# Patient Record
Sex: Male | Born: 1965 | Race: White | Hispanic: No | Smoking: Former smoker
Health system: Southern US, Community
[De-identification: ages and names within clinical notes are randomized; demographics above are authoritative.]

## PROBLEM LIST (undated history)

## (undated) DIAGNOSIS — I4891 Unspecified atrial fibrillation: Secondary | ICD-10-CM

## (undated) DIAGNOSIS — I471 Supraventricular tachycardia, unspecified: Secondary | ICD-10-CM

## (undated) DIAGNOSIS — K4090 Unilateral inguinal hernia, without obstruction or gangrene, not specified as recurrent: Secondary | ICD-10-CM

## (undated) DIAGNOSIS — G894 Chronic pain syndrome: Secondary | ICD-10-CM

## (undated) DIAGNOSIS — E785 Hyperlipidemia, unspecified: Secondary | ICD-10-CM

## (undated) DIAGNOSIS — R7303 Prediabetes: Secondary | ICD-10-CM

## (undated) DIAGNOSIS — M419 Scoliosis, unspecified: Secondary | ICD-10-CM

## (undated) DIAGNOSIS — R011 Cardiac murmur, unspecified: Secondary | ICD-10-CM

## (undated) DIAGNOSIS — I499 Cardiac arrhythmia, unspecified: Secondary | ICD-10-CM

## (undated) DIAGNOSIS — Z87442 Personal history of urinary calculi: Secondary | ICD-10-CM

## (undated) DIAGNOSIS — M1990 Unspecified osteoarthritis, unspecified site: Secondary | ICD-10-CM

## (undated) DIAGNOSIS — Z5189 Encounter for other specified aftercare: Secondary | ICD-10-CM

## (undated) DIAGNOSIS — I429 Cardiomyopathy, unspecified: Secondary | ICD-10-CM

## (undated) DIAGNOSIS — N200 Calculus of kidney: Secondary | ICD-10-CM

## (undated) DIAGNOSIS — I10 Essential (primary) hypertension: Secondary | ICD-10-CM

## (undated) HISTORY — DX: Supraventricular tachycardia: I47.1

## (undated) HISTORY — DX: Essential (primary) hypertension: I10

## (undated) HISTORY — DX: Scoliosis, unspecified: M41.9

## (undated) HISTORY — PX: REPLACEMENT TOTAL KNEE: SUR1224

## (undated) HISTORY — DX: Supraventricular tachycardia, unspecified: I47.10

## (undated) HISTORY — PX: AV NODE ABLATION: SHX1209

## (undated) HISTORY — PX: COLONOSCOPY: SHX174

## (undated) HISTORY — DX: Calculus of kidney: N20.0

## (undated) HISTORY — PX: INGUINAL HERNIA REPAIR: SUR1180

## (undated) HISTORY — DX: Unspecified atrial fibrillation: I48.91

## (undated) HISTORY — DX: Hyperlipidemia, unspecified: E78.5

## (undated) HISTORY — DX: Encounter for other specified aftercare: Z51.89

## (undated) HISTORY — PX: BACK SURGERY: SHX140

## (undated) HISTORY — DX: Cardiomyopathy, unspecified: I42.9

## (undated) HISTORY — DX: Chronic pain syndrome: G89.4

## (undated) NOTE — *Deleted (*Deleted)
Please stop by lab before you go If you have mychart- we will send your results within 3 business days of [place] receiving them.  If you do not have mychart- we will call you about results within 5 business days of [place] receiving them.  *please note we are currently using [facility] which has a longer processing time than [facility] typically so labs may not come back as quickly as in the past *please also note that you will see labs on mychart as soon as they post. I will later go in and write notes on them- will say "notes from Dr. Durene Cal"  Health Maintenance Due  Topic Date Due  . COVID-19 Vaccine (1) Never done  . TETANUS/TDAP  01/07/2020  . INFLUENZA VACCINE  03/17/2020   Depression screen [facility] 2/9 11/07/2019 05/01/2019 10/13/2017  Decreased Interest 0 0 0  Down, Depressed, Hopeless 0 0 0  PHQ - 2 Score 0 0 0  Altered sleeping 1 - -  Tired, decreased energy 0 - -  Change in appetite 0 - -  Feeling bad or failure about yourself  0 - -  Trouble concentrating 0 - -  Moving slowly or fidgety/restless 0 - -  Suicidal thoughts 0 - -  PHQ-9 Score 1 - -  Difficult doing work/chores Not difficult at all - -

---

## 1998-06-06 ENCOUNTER — Emergency Department (HOSPITAL_COMMUNITY): Admission: EM | Admit: 1998-06-06 | Discharge: 1998-06-06 | Payer: Self-pay | Admitting: Emergency Medicine

## 1998-07-25 ENCOUNTER — Ambulatory Visit (HOSPITAL_COMMUNITY): Admission: RE | Admit: 1998-07-25 | Discharge: 1998-07-25 | Payer: Self-pay | Admitting: Neurosurgery

## 1998-07-25 ENCOUNTER — Encounter: Payer: Self-pay | Admitting: Neurosurgery

## 1998-07-27 ENCOUNTER — Emergency Department (HOSPITAL_COMMUNITY): Admission: EM | Admit: 1998-07-27 | Discharge: 1998-07-27 | Payer: Self-pay | Admitting: Emergency Medicine

## 1998-07-27 ENCOUNTER — Encounter: Payer: Self-pay | Admitting: Emergency Medicine

## 1998-08-15 ENCOUNTER — Encounter: Payer: Self-pay | Admitting: Neurosurgery

## 1998-08-22 ENCOUNTER — Ambulatory Visit: Admission: RE | Admit: 1998-08-22 | Discharge: 1998-08-22 | Payer: Self-pay | Admitting: Neurosurgery

## 1998-08-22 ENCOUNTER — Encounter: Payer: Self-pay | Admitting: Neurosurgery

## 1999-02-13 ENCOUNTER — Ambulatory Visit (HOSPITAL_COMMUNITY): Admission: RE | Admit: 1999-02-13 | Discharge: 1999-02-13 | Payer: Self-pay | Admitting: Neurosurgery

## 1999-02-13 ENCOUNTER — Encounter: Payer: Self-pay | Admitting: Neurosurgery

## 1999-03-04 ENCOUNTER — Ambulatory Visit (HOSPITAL_COMMUNITY): Admission: RE | Admit: 1999-03-04 | Discharge: 1999-03-04 | Payer: Self-pay | Admitting: Neurosurgery

## 1999-03-04 ENCOUNTER — Encounter: Payer: Self-pay | Admitting: Neurosurgery

## 1999-03-18 ENCOUNTER — Ambulatory Visit (HOSPITAL_COMMUNITY): Admission: RE | Admit: 1999-03-18 | Discharge: 1999-03-18 | Payer: Self-pay | Admitting: Orthopedic Surgery

## 1999-03-18 ENCOUNTER — Encounter: Payer: Self-pay | Admitting: Orthopedic Surgery

## 1999-04-29 ENCOUNTER — Inpatient Hospital Stay (HOSPITAL_COMMUNITY): Admission: RE | Admit: 1999-04-29 | Discharge: 1999-05-03 | Payer: Self-pay | Admitting: Neurosurgery

## 1999-04-29 ENCOUNTER — Encounter: Payer: Self-pay | Admitting: Neurosurgery

## 1999-05-05 ENCOUNTER — Emergency Department (HOSPITAL_COMMUNITY): Admission: EM | Admit: 1999-05-05 | Discharge: 1999-05-06 | Payer: Self-pay | Admitting: *Deleted

## 1999-06-01 ENCOUNTER — Emergency Department (HOSPITAL_COMMUNITY): Admission: EM | Admit: 1999-06-01 | Discharge: 1999-06-02 | Payer: Self-pay

## 1999-06-02 ENCOUNTER — Encounter: Payer: Self-pay | Admitting: Neurosurgery

## 1999-06-02 ENCOUNTER — Ambulatory Visit (HOSPITAL_COMMUNITY): Admission: RE | Admit: 1999-06-02 | Discharge: 1999-06-02 | Payer: Self-pay | Admitting: Neurosurgery

## 1999-07-16 ENCOUNTER — Ambulatory Visit (HOSPITAL_COMMUNITY): Admission: RE | Admit: 1999-07-16 | Discharge: 1999-07-16 | Payer: Self-pay | Admitting: Neurosurgery

## 1999-07-16 ENCOUNTER — Encounter: Payer: Self-pay | Admitting: Neurosurgery

## 1999-12-24 ENCOUNTER — Encounter: Payer: Self-pay | Admitting: Neurosurgery

## 1999-12-24 ENCOUNTER — Ambulatory Visit (HOSPITAL_COMMUNITY): Admission: RE | Admit: 1999-12-24 | Discharge: 1999-12-24 | Payer: Self-pay | Admitting: Neurosurgery

## 2000-02-10 ENCOUNTER — Encounter: Admission: RE | Admit: 2000-02-10 | Discharge: 2000-05-10 | Payer: Self-pay | Admitting: Anesthesiology

## 2000-04-13 ENCOUNTER — Inpatient Hospital Stay (HOSPITAL_COMMUNITY): Admission: RE | Admit: 2000-04-13 | Discharge: 2000-04-16 | Payer: Self-pay | Admitting: Neurosurgery

## 2000-04-13 ENCOUNTER — Encounter: Payer: Self-pay | Admitting: Neurosurgery

## 2000-05-14 ENCOUNTER — Ambulatory Visit (HOSPITAL_COMMUNITY): Admission: RE | Admit: 2000-05-14 | Discharge: 2000-05-14 | Payer: Self-pay | Admitting: Neurosurgery

## 2000-05-14 ENCOUNTER — Encounter: Payer: Self-pay | Admitting: Neurosurgery

## 2000-05-21 ENCOUNTER — Encounter: Admission: RE | Admit: 2000-05-21 | Discharge: 2000-08-19 | Payer: Self-pay | Admitting: Anesthesiology

## 2000-07-20 ENCOUNTER — Encounter: Payer: Self-pay | Admitting: Neurosurgery

## 2000-07-20 ENCOUNTER — Ambulatory Visit (HOSPITAL_COMMUNITY): Admission: RE | Admit: 2000-07-20 | Discharge: 2000-07-20 | Payer: Self-pay | Admitting: Neurosurgery

## 2000-08-06 ENCOUNTER — Encounter: Admission: RE | Admit: 2000-08-06 | Discharge: 2000-08-06 | Payer: Self-pay | Admitting: Internal Medicine

## 2000-08-06 ENCOUNTER — Encounter: Payer: Self-pay | Admitting: Internal Medicine

## 2000-09-03 ENCOUNTER — Encounter: Admission: RE | Admit: 2000-09-03 | Discharge: 2000-12-02 | Payer: Self-pay | Admitting: Anesthesiology

## 2000-12-01 ENCOUNTER — Encounter: Admission: RE | Admit: 2000-12-01 | Discharge: 2001-03-01 | Payer: Self-pay | Admitting: Anesthesiology

## 2001-01-24 ENCOUNTER — Encounter: Payer: Self-pay | Admitting: Emergency Medicine

## 2001-01-24 ENCOUNTER — Emergency Department (HOSPITAL_COMMUNITY): Admission: EM | Admit: 2001-01-24 | Discharge: 2001-01-24 | Payer: Self-pay | Admitting: Internal Medicine

## 2001-02-21 ENCOUNTER — Encounter: Payer: Self-pay | Admitting: Neurosurgery

## 2001-02-21 ENCOUNTER — Ambulatory Visit (HOSPITAL_COMMUNITY): Admission: RE | Admit: 2001-02-21 | Discharge: 2001-02-21 | Payer: Self-pay | Admitting: Neurosurgery

## 2001-03-04 ENCOUNTER — Encounter: Payer: Self-pay | Admitting: Neurosurgery

## 2001-03-04 ENCOUNTER — Encounter: Admission: RE | Admit: 2001-03-04 | Discharge: 2001-03-04 | Payer: Self-pay | Admitting: Neurosurgery

## 2001-03-07 ENCOUNTER — Encounter: Admission: RE | Admit: 2001-03-07 | Discharge: 2001-03-16 | Payer: Self-pay | Admitting: Anesthesiology

## 2001-03-31 ENCOUNTER — Encounter: Admission: RE | Admit: 2001-03-31 | Discharge: 2001-04-16 | Payer: Self-pay | Admitting: Anesthesiology

## 2005-05-07 ENCOUNTER — Ambulatory Visit: Payer: Self-pay | Admitting: Internal Medicine

## 2005-05-18 ENCOUNTER — Ambulatory Visit: Payer: Self-pay

## 2005-05-20 ENCOUNTER — Ambulatory Visit: Payer: Self-pay | Admitting: Internal Medicine

## 2005-05-25 ENCOUNTER — Ambulatory Visit: Payer: Self-pay | Admitting: Internal Medicine

## 2005-05-28 ENCOUNTER — Ambulatory Visit: Payer: Self-pay | Admitting: Cardiology

## 2005-07-03 ENCOUNTER — Ambulatory Visit: Payer: Self-pay | Admitting: Internal Medicine

## 2005-07-14 ENCOUNTER — Ambulatory Visit: Payer: Self-pay | Admitting: Internal Medicine

## 2005-09-09 ENCOUNTER — Ambulatory Visit: Payer: Self-pay | Admitting: Internal Medicine

## 2005-09-15 ENCOUNTER — Ambulatory Visit: Payer: Self-pay | Admitting: Internal Medicine

## 2005-11-18 ENCOUNTER — Ambulatory Visit: Payer: Self-pay | Admitting: Internal Medicine

## 2005-11-18 ENCOUNTER — Inpatient Hospital Stay (HOSPITAL_COMMUNITY): Admission: EM | Admit: 2005-11-18 | Discharge: 2005-11-20 | Payer: Self-pay | Admitting: Emergency Medicine

## 2005-12-14 ENCOUNTER — Ambulatory Visit: Payer: Self-pay | Admitting: Internal Medicine

## 2006-01-30 ENCOUNTER — Ambulatory Visit: Payer: Self-pay | Admitting: Family Medicine

## 2006-03-30 ENCOUNTER — Ambulatory Visit: Payer: Self-pay | Admitting: Internal Medicine

## 2006-04-27 ENCOUNTER — Ambulatory Visit: Payer: Self-pay | Admitting: Internal Medicine

## 2006-06-11 ENCOUNTER — Ambulatory Visit: Payer: Self-pay | Admitting: Internal Medicine

## 2006-06-15 ENCOUNTER — Ambulatory Visit: Payer: Self-pay | Admitting: Internal Medicine

## 2006-08-12 ENCOUNTER — Ambulatory Visit: Payer: Self-pay | Admitting: Internal Medicine

## 2006-09-20 ENCOUNTER — Ambulatory Visit: Payer: Self-pay | Admitting: Internal Medicine

## 2006-11-19 ENCOUNTER — Ambulatory Visit: Payer: Self-pay | Admitting: Internal Medicine

## 2006-11-19 LAB — CONVERTED CEMR LAB
Cholesterol: 170 mg/dL (ref 0–200)
HDL: 31.1 mg/dL — ABNORMAL LOW (ref >39.0)
LDL Cholesterol: 100 mg/dL — ABNORMAL HIGH (ref 0–99)
Total CHOL/HDL Ratio: 5.5
Triglycerides: 196 mg/dL — ABNORMAL HIGH (ref 0–149)
VLDL: 39 mg/dL (ref 0–40)

## 2006-12-03 ENCOUNTER — Ambulatory Visit: Payer: Self-pay | Admitting: Internal Medicine

## 2007-03-07 DIAGNOSIS — I10 Essential (primary) hypertension: Secondary | ICD-10-CM | POA: Insufficient documentation

## 2007-03-07 DIAGNOSIS — M549 Dorsalgia, unspecified: Secondary | ICD-10-CM | POA: Insufficient documentation

## 2007-03-07 DIAGNOSIS — Z87442 Personal history of urinary calculi: Secondary | ICD-10-CM | POA: Insufficient documentation

## 2007-04-07 ENCOUNTER — Ambulatory Visit: Payer: Self-pay | Admitting: Internal Medicine

## 2007-04-07 LAB — CONVERTED CEMR LAB
ALT: 26 units/L (ref 0–53)
AST: 22 units/L (ref 0–37)
Albumin: 4.2 g/dL (ref 3.5–5.2)
Alkaline Phosphatase: 56 units/L (ref 39–117)
BUN: 10 mg/dL (ref 6–23)
Basophils Absolute: 0 10*3/uL (ref 0.0–0.1)
Basophils Relative: 0.1 % (ref 0.0–1.0)
Bilirubin, Direct: 0.1 mg/dL (ref 0.0–0.3)
CO2: 34 meq/L — ABNORMAL HIGH (ref 19–32)
Calcium: 9.6 mg/dL (ref 8.4–10.5)
Chloride: 99 meq/L (ref 96–112)
Cholesterol: 198 mg/dL (ref 0–200)
Creatinine, Ser: 0.8 mg/dL (ref 0.4–1.5)
Eosinophils Absolute: 0.2 10*3/uL (ref 0.0–0.6)
Eosinophils Relative: 2.5 % (ref 0.0–5.0)
GFR calc Af Amer: 136 mL/min
GFR calc non Af Amer: 112 mL/min
Glucose, Bld: 102 mg/dL — ABNORMAL HIGH (ref 70–99)
HCT: 47 % (ref 39.0–52.0)
HDL: 29.9 mg/dL — ABNORMAL LOW (ref >39.0)
Hemoglobin: 16.5 g/dL (ref 13.0–17.0)
LDL Cholesterol: 137 mg/dL — ABNORMAL HIGH (ref 0–99)
Lymphocytes Relative: 38.7 % (ref 12.0–46.0)
MCHC: 35.1 g/dL (ref 30.0–36.0)
MCV: 87.4 fL (ref 78.0–100.0)
Monocytes Absolute: 0.7 10*3/uL (ref 0.2–0.7)
Monocytes Relative: 10.1 % (ref 3.0–11.0)
Neutro Abs: 3.3 10*3/uL (ref 1.4–7.7)
Neutrophils Relative %: 48.6 % (ref 43.0–77.0)
Platelets: 303 10*3/uL (ref 150–400)
Potassium: 4.2 meq/L (ref 3.5–5.1)
RBC: 5.37 M/uL (ref 4.22–5.81)
RDW: 12.3 % (ref 11.5–14.6)
Sodium: 139 meq/L (ref 135–145)
Total Bilirubin: 1.3 mg/dL — ABNORMAL HIGH (ref 0.3–1.2)
Total CHOL/HDL Ratio: 6.6
Total Protein: 7.6 g/dL (ref 6.0–8.3)
Triglycerides: 156 mg/dL — ABNORMAL HIGH (ref 0–149)
VLDL: 31 mg/dL (ref 0–40)
WBC: 6.8 10*3/uL (ref 4.5–10.5)

## 2007-04-14 ENCOUNTER — Ambulatory Visit: Payer: Self-pay | Admitting: Internal Medicine

## 2007-04-14 DIAGNOSIS — E1169 Type 2 diabetes mellitus with other specified complication: Secondary | ICD-10-CM | POA: Insufficient documentation

## 2007-04-14 DIAGNOSIS — E785 Hyperlipidemia, unspecified: Secondary | ICD-10-CM | POA: Insufficient documentation

## 2007-04-15 ENCOUNTER — Telehealth: Payer: Self-pay | Admitting: Internal Medicine

## 2007-04-19 ENCOUNTER — Ambulatory Visit: Payer: Self-pay | Admitting: Internal Medicine

## 2007-04-26 ENCOUNTER — Telehealth: Payer: Self-pay | Admitting: Internal Medicine

## 2007-07-26 ENCOUNTER — Ambulatory Visit: Payer: Self-pay | Admitting: Internal Medicine

## 2007-07-26 LAB — CONVERTED CEMR LAB
ALT: 27 units/L (ref 0–53)
AST: 27 units/L (ref 0–37)
Albumin: 4.3 g/dL (ref 3.5–5.2)
Alkaline Phosphatase: 59 units/L (ref 39–117)
BUN: 11 mg/dL (ref 6–23)
Basophils Absolute: 0 10*3/uL (ref 0.0–0.1)
Basophils Relative: 0.2 % (ref 0.0–1.0)
Bilirubin Urine: NEGATIVE
Bilirubin, Direct: 0.3 mg/dL (ref 0.0–0.3)
CO2: 33 meq/L — ABNORMAL HIGH (ref 19–32)
Calcium: 10.1 mg/dL (ref 8.4–10.5)
Chloride: 99 meq/L (ref 96–112)
Cholesterol: 192 mg/dL (ref 0–200)
Creatinine, Ser: 1.1 mg/dL (ref 0.4–1.5)
Eosinophils Absolute: 0.1 10*3/uL (ref 0.0–0.6)
Eosinophils Relative: 1.4 % (ref 0.0–5.0)
GFR calc Af Amer: 94 mL/min
GFR calc non Af Amer: 78 mL/min
Glucose, Bld: 108 mg/dL — ABNORMAL HIGH (ref 70–99)
Glucose, Urine, Semiquant: NEGATIVE
HCT: 47 % (ref 39.0–52.0)
HDL: 32.3 mg/dL — ABNORMAL LOW (ref >39.0)
Hemoglobin: 16 g/dL (ref 13.0–17.0)
Ketones, urine, test strip: NEGATIVE
LDL Cholesterol: 131 mg/dL — ABNORMAL HIGH (ref 0–99)
Lymphocytes Relative: 32.9 % (ref 12.0–46.0)
MCHC: 34 g/dL (ref 30.0–36.0)
MCV: 87.6 fL (ref 78.0–100.0)
Monocytes Absolute: 0.5 10*3/uL (ref 0.2–0.7)
Monocytes Relative: 7.6 % (ref 3.0–11.0)
Neutro Abs: 3.5 10*3/uL (ref 1.4–7.7)
Neutrophils Relative %: 57.9 % (ref 43.0–77.0)
Nitrite: NEGATIVE
Platelets: 251 10*3/uL (ref 150–400)
Potassium: 5.3 meq/L — ABNORMAL HIGH (ref 3.5–5.1)
RBC: 5.36 M/uL (ref 4.22–5.81)
RDW: 12.4 % (ref 11.5–14.6)
Sodium: 140 meq/L (ref 135–145)
Specific Gravity, Urine: >=1.03
Total Bilirubin: 1.2 mg/dL (ref 0.3–1.2)
Total CHOL/HDL Ratio: 5.9
Total Protein: 7.5 g/dL (ref 6.0–8.3)
Triglycerides: 145 mg/dL (ref 0–149)
Urobilinogen, UA: 0.2
VLDL: 29 mg/dL (ref 0–40)
WBC Urine, dipstick: NEGATIVE
WBC: 6.1 10*3/uL (ref 4.5–10.5)
pH: 5

## 2007-07-31 ENCOUNTER — Emergency Department (HOSPITAL_COMMUNITY): Admission: EM | Admit: 2007-07-31 | Discharge: 2007-07-31 | Payer: Self-pay | Admitting: Emergency Medicine

## 2007-08-01 ENCOUNTER — Ambulatory Visit: Payer: Self-pay | Admitting: Internal Medicine

## 2007-08-01 LAB — CONVERTED CEMR LAB
BUN: 8 mg/dL (ref 6–23)
Bacteria, UA: NEGATIVE
Bilirubin Urine: NEGATIVE
CO2: 32 meq/L (ref 19–32)
Calcium: 9.6 mg/dL (ref 8.4–10.5)
Chloride: 99 meq/L (ref 96–112)
Creatinine, Ser: 1 mg/dL (ref 0.4–1.5)
GFR calc Af Amer: 105 mL/min
GFR calc non Af Amer: 87 mL/min
Glucose, Bld: 98 mg/dL (ref 70–99)
Ketones, ur: NEGATIVE mg/dL
Leukocytes, UA: NEGATIVE
Nitrite: NEGATIVE
Potassium: 4.2 meq/L (ref 3.5–5.1)
Sodium: 139 meq/L (ref 135–145)
Specific Gravity, Urine: 1.025 (ref 1.000–1.03)
Squamous Epithelial / LPF: NEGATIVE /lpf
TSH: 1.57 microintl units/mL (ref 0.35–5.50)
Urine Glucose: NEGATIVE mg/dL
Urobilinogen, UA: 0.2 (ref 0.0–1.0)
WBC, UA: NONE SEEN cells/hpf
pH: 6 (ref 5.0–8.0)

## 2007-08-02 ENCOUNTER — Ambulatory Visit: Payer: Self-pay | Admitting: Internal Medicine

## 2007-08-03 ENCOUNTER — Encounter: Payer: Self-pay | Admitting: Internal Medicine

## 2007-08-03 ENCOUNTER — Ambulatory Visit: Payer: Self-pay | Admitting: Internal Medicine

## 2007-08-03 ENCOUNTER — Ambulatory Visit: Payer: Self-pay

## 2007-08-12 ENCOUNTER — Ambulatory Visit: Payer: Self-pay | Admitting: Internal Medicine

## 2007-08-18 DIAGNOSIS — I4891 Unspecified atrial fibrillation: Secondary | ICD-10-CM

## 2007-08-18 DIAGNOSIS — I429 Cardiomyopathy, unspecified: Secondary | ICD-10-CM

## 2007-08-18 HISTORY — DX: Cardiomyopathy, unspecified: I42.9

## 2007-08-18 HISTORY — DX: Unspecified atrial fibrillation: I48.91

## 2007-08-22 ENCOUNTER — Telehealth: Payer: Self-pay | Admitting: Internal Medicine

## 2007-09-07 ENCOUNTER — Ambulatory Visit: Payer: Self-pay | Admitting: Internal Medicine

## 2007-09-07 LAB — CONVERTED CEMR LAB
BUN: 8 mg/dL (ref 6–23)
Basophils Absolute: 0 10*3/uL (ref 0.0–0.1)
Basophils Relative: 0.7 % (ref 0.0–1.0)
CO2: 33 meq/L — ABNORMAL HIGH (ref 19–32)
Calcium: 9.8 mg/dL (ref 8.4–10.5)
Chloride: 99 meq/L (ref 96–112)
Creatinine, Ser: 1.1 mg/dL (ref 0.4–1.5)
Eosinophils Absolute: 0.1 10*3/uL (ref 0.0–0.6)
Eosinophils Relative: 1.2 % (ref 0.0–5.0)
GFR calc Af Amer: 94 mL/min
GFR calc non Af Amer: 77 mL/min
Glucose, Bld: 98 mg/dL (ref 70–99)
HCT: 46.7 % (ref 39.0–52.0)
Hemoglobin: 16.7 g/dL (ref 13.0–17.0)
INR: 1.1 — ABNORMAL HIGH (ref 0.8–1.0)
Lymphocytes Relative: 33.3 % (ref 12.0–46.0)
MCHC: 35.7 g/dL (ref 30.0–36.0)
MCV: 86.5 fL (ref 78.0–100.0)
Monocytes Absolute: 0.5 10*3/uL (ref 0.2–0.7)
Monocytes Relative: 7.5 % (ref 3.0–11.0)
Neutro Abs: 3.6 10*3/uL (ref 1.4–7.7)
Neutrophils Relative %: 57.3 % (ref 43.0–77.0)
Platelets: 278 10*3/uL (ref 150–400)
Potassium: 3.9 meq/L (ref 3.5–5.1)
Prothrombin Time: 12.6 s (ref 10.9–13.3)
RBC: 5.4 M/uL (ref 4.22–5.81)
RDW: 13.2 % (ref 11.5–14.6)
Sodium: 139 meq/L (ref 135–145)
WBC: 6.3 10*3/uL (ref 4.5–10.5)

## 2007-09-08 ENCOUNTER — Ambulatory Visit: Payer: Self-pay | Admitting: Internal Medicine

## 2007-09-08 LAB — CONVERTED CEMR LAB
ALT: 32 units/L (ref 0–53)
AST: 28 units/L (ref 0–37)
Albumin: 4.5 g/dL (ref 3.5–5.2)
Alkaline Phosphatase: 57 units/L (ref 39–117)
BUN: 9 mg/dL (ref 6–23)
Bilirubin, Direct: 0.1 mg/dL (ref 0.0–0.3)
CO2: 32 meq/L (ref 19–32)
Calcium: 10 mg/dL (ref 8.4–10.5)
Chloride: 98 meq/L (ref 96–112)
Creatinine, Ser: 1 mg/dL (ref 0.4–1.5)
GFR calc Af Amer: 104 mL/min
GFR calc non Af Amer: 86 mL/min
Glucose, Bld: 120 mg/dL — ABNORMAL HIGH (ref 70–99)
Potassium: 3.9 meq/L (ref 3.5–5.1)
Sodium: 139 meq/L (ref 135–145)
Total Bilirubin: 0.8 mg/dL (ref 0.3–1.2)
Total Protein: 7.9 g/dL (ref 6.0–8.3)

## 2007-09-16 ENCOUNTER — Ambulatory Visit: Payer: Self-pay | Admitting: Internal Medicine

## 2007-09-16 ENCOUNTER — Ambulatory Visit (HOSPITAL_COMMUNITY): Admission: RE | Admit: 2007-09-16 | Discharge: 2007-09-17 | Payer: Self-pay | Admitting: Internal Medicine

## 2007-10-19 ENCOUNTER — Ambulatory Visit: Payer: Self-pay | Admitting: Internal Medicine

## 2008-01-30 ENCOUNTER — Encounter: Payer: Self-pay | Admitting: Internal Medicine

## 2008-02-03 ENCOUNTER — Encounter: Payer: Self-pay | Admitting: Internal Medicine

## 2008-07-27 DIAGNOSIS — I429 Cardiomyopathy, unspecified: Secondary | ICD-10-CM | POA: Insufficient documentation

## 2008-08-21 ENCOUNTER — Telehealth: Payer: Self-pay | Admitting: Internal Medicine

## 2008-10-30 ENCOUNTER — Telehealth: Payer: Self-pay | Admitting: Internal Medicine

## 2008-10-31 ENCOUNTER — Ambulatory Visit: Payer: Self-pay | Admitting: Internal Medicine

## 2009-04-02 ENCOUNTER — Ambulatory Visit: Payer: Self-pay | Admitting: Internal Medicine

## 2009-05-20 ENCOUNTER — Ambulatory Visit: Payer: Self-pay | Admitting: Internal Medicine

## 2009-10-23 ENCOUNTER — Encounter: Payer: Self-pay | Admitting: Internal Medicine

## 2009-11-07 ENCOUNTER — Ambulatory Visit: Payer: Self-pay | Admitting: Family Medicine

## 2009-11-07 ENCOUNTER — Telehealth: Payer: Self-pay | Admitting: Family Medicine

## 2009-11-11 ENCOUNTER — Telehealth (INDEPENDENT_AMBULATORY_CARE_PROVIDER_SITE_OTHER): Payer: Self-pay | Admitting: *Deleted

## 2009-11-11 ENCOUNTER — Ambulatory Visit: Payer: Self-pay | Admitting: Internal Medicine

## 2009-11-11 ENCOUNTER — Telehealth: Payer: Self-pay | Admitting: Internal Medicine

## 2009-11-27 ENCOUNTER — Telehealth: Payer: Self-pay | Admitting: Internal Medicine

## 2009-12-02 ENCOUNTER — Encounter: Payer: Self-pay | Admitting: Internal Medicine

## 2009-12-26 ENCOUNTER — Ambulatory Visit: Payer: Self-pay | Admitting: Internal Medicine

## 2009-12-26 LAB — CONVERTED CEMR LAB
ALT: 28 units/L (ref 0–53)
AST: 40 units/L — ABNORMAL HIGH (ref 0–37)
Albumin: 4.5 g/dL (ref 3.5–5.2)
Alkaline Phosphatase: 58 units/L (ref 39–117)
BUN: 12 mg/dL (ref 6–23)
Basophils Absolute: 0 10*3/uL (ref 0.0–0.1)
Basophils Relative: 0.5 % (ref 0.0–3.0)
Bilirubin, Direct: 0.3 mg/dL (ref 0.0–0.3)
CO2: 33 meq/L — ABNORMAL HIGH (ref 19–32)
Calcium: 10 mg/dL (ref 8.4–10.5)
Chloride: 102 meq/L (ref 96–112)
Cholesterol: 177 mg/dL (ref 0–200)
Creatinine, Ser: 0.9 mg/dL (ref 0.4–1.5)
Eosinophils Absolute: 0.1 10*3/uL (ref 0.0–0.7)
Eosinophils Relative: 1.2 % (ref 0.0–5.0)
GFR calc non Af Amer: 91.7 mL/min (ref >60)
Glucose, Bld: 89 mg/dL (ref 70–99)
Glucose, Urine, Semiquant: NEGATIVE
HCT: 46.2 % (ref 39.0–52.0)
HDL: 57.2 mg/dL (ref >39.00)
Hemoglobin: 16 g/dL (ref 13.0–17.0)
LDL Cholesterol: 100 mg/dL — ABNORMAL HIGH (ref 0–99)
Lymphocytes Relative: 34.2 % (ref 12.0–46.0)
Lymphs Abs: 2 10*3/uL (ref 0.7–4.0)
MCHC: 34.7 g/dL (ref 30.0–36.0)
MCV: 90.5 fL (ref 78.0–100.0)
Monocytes Absolute: 0.6 10*3/uL (ref 0.1–1.0)
Monocytes Relative: 10.4 % (ref 3.0–12.0)
Neutro Abs: 3.2 10*3/uL (ref 1.4–7.7)
Neutrophils Relative %: 53.7 % (ref 43.0–77.0)
Nitrite: NEGATIVE
Platelets: 241 10*3/uL (ref 150.0–400.0)
Potassium: 4.9 meq/L (ref 3.5–5.1)
RBC: 5.11 M/uL (ref 4.22–5.81)
RDW: 13.6 % (ref 11.5–14.6)
Sodium: 147 meq/L — ABNORMAL HIGH (ref 135–145)
Specific Gravity, Urine: >=1.03
TSH: 3.84 microintl units/mL (ref 0.35–5.50)
Total Bilirubin: 2.3 mg/dL — ABNORMAL HIGH (ref 0.3–1.2)
Total CHOL/HDL Ratio: 3
Total Protein: 7.4 g/dL (ref 6.0–8.3)
Triglycerides: 99 mg/dL (ref 0.0–149.0)
Urobilinogen, UA: 0.2
VLDL: 19.8 mg/dL (ref 0.0–40.0)
WBC Urine, dipstick: NEGATIVE
WBC: 5.9 10*3/uL (ref 4.5–10.5)
pH: 5.5

## 2009-12-27 ENCOUNTER — Ambulatory Visit: Payer: Self-pay | Admitting: Internal Medicine

## 2009-12-27 ENCOUNTER — Encounter: Payer: Self-pay | Admitting: Internal Medicine

## 2010-01-01 ENCOUNTER — Telehealth: Payer: Self-pay | Admitting: Internal Medicine

## 2010-01-06 ENCOUNTER — Telehealth: Payer: Self-pay | Admitting: Internal Medicine

## 2010-01-06 ENCOUNTER — Ambulatory Visit: Payer: Self-pay | Admitting: Internal Medicine

## 2010-01-06 DIAGNOSIS — M4120 Other idiopathic scoliosis, site unspecified: Secondary | ICD-10-CM | POA: Insufficient documentation

## 2010-01-20 ENCOUNTER — Ambulatory Visit: Payer: Self-pay | Admitting: Internal Medicine

## 2010-01-22 ENCOUNTER — Telehealth: Payer: Self-pay | Admitting: Internal Medicine

## 2010-01-29 ENCOUNTER — Telehealth: Payer: Self-pay | Admitting: Internal Medicine

## 2010-01-31 ENCOUNTER — Encounter
Admission: RE | Admit: 2010-01-31 | Discharge: 2010-02-06 | Payer: Self-pay | Admitting: Physical Medicine & Rehabilitation

## 2010-02-06 ENCOUNTER — Telehealth: Payer: Self-pay | Admitting: Internal Medicine

## 2010-02-06 ENCOUNTER — Ambulatory Visit: Payer: Self-pay | Admitting: Physical Medicine & Rehabilitation

## 2010-02-07 ENCOUNTER — Telehealth: Payer: Self-pay | Admitting: Family Medicine

## 2010-02-07 ENCOUNTER — Telehealth: Payer: Self-pay | Admitting: Internal Medicine

## 2010-02-11 ENCOUNTER — Telehealth: Payer: Self-pay | Admitting: Internal Medicine

## 2010-02-13 ENCOUNTER — Telehealth: Payer: Self-pay | Admitting: Internal Medicine

## 2010-02-19 ENCOUNTER — Telehealth: Payer: Self-pay | Admitting: Internal Medicine

## 2010-02-25 ENCOUNTER — Telehealth: Payer: Self-pay | Admitting: Internal Medicine

## 2010-02-26 ENCOUNTER — Ambulatory Visit: Payer: Self-pay | Admitting: Internal Medicine

## 2010-02-28 LAB — CONVERTED CEMR LAB
ALT: 22 units/L (ref 0–53)
AST: 24 units/L (ref 0–37)
Albumin: 4.4 g/dL (ref 3.5–5.2)
Alkaline Phosphatase: 58 units/L (ref 39–117)
BUN: 17 mg/dL (ref 6–23)
Basophils Absolute: 0 10*3/uL (ref 0.0–0.1)
Basophils Relative: 0.6 % (ref 0.0–3.0)
Bilirubin, Direct: 0.1 mg/dL (ref 0.0–0.3)
CO2: 35 meq/L — ABNORMAL HIGH (ref 19–32)
Calcium: 10.1 mg/dL (ref 8.4–10.5)
Chloride: 97 meq/L (ref 96–112)
Creatinine, Ser: 1 mg/dL (ref 0.4–1.5)
Eosinophils Absolute: 0.1 10*3/uL (ref 0.0–0.7)
Eosinophils Relative: 1.3 % (ref 0.0–5.0)
GFR calc non Af Amer: 87.32 mL/min (ref >60)
Glucose, Bld: 114 mg/dL — ABNORMAL HIGH (ref 70–99)
HCT: 51.7 % (ref 39.0–52.0)
Hemoglobin: 17.7 g/dL — ABNORMAL HIGH (ref 13.0–17.0)
Lymphocytes Relative: 28.3 % (ref 12.0–46.0)
Lymphs Abs: 2.4 10*3/uL (ref 0.7–4.0)
MCHC: 34.2 g/dL (ref 30.0–36.0)
MCV: 91.4 fL (ref 78.0–100.0)
Monocytes Absolute: 0.6 10*3/uL (ref 0.1–1.0)
Monocytes Relative: 7.5 % (ref 3.0–12.0)
Neutro Abs: 5.3 10*3/uL (ref 1.4–7.7)
Neutrophils Relative %: 62.3 % (ref 43.0–77.0)
Platelets: 258 10*3/uL (ref 150.0–400.0)
Potassium: 4.7 meq/L (ref 3.5–5.1)
RBC: 5.66 M/uL (ref 4.22–5.81)
RDW: 13.2 % (ref 11.5–14.6)
Sed Rate: 8 mm/hr (ref 0–22)
Sodium: 139 meq/L (ref 135–145)
TSH: 2.7 microintl units/mL (ref 0.35–5.50)
Total Bilirubin: 0.9 mg/dL (ref 0.3–1.2)
Total Protein: 7.7 g/dL (ref 6.0–8.3)
WBC: 8.4 10*3/uL (ref 4.5–10.5)

## 2010-03-07 ENCOUNTER — Telehealth: Payer: Self-pay | Admitting: *Deleted

## 2010-03-17 ENCOUNTER — Telehealth: Payer: Self-pay | Admitting: Internal Medicine

## 2010-03-26 ENCOUNTER — Telehealth: Payer: Self-pay | Admitting: Internal Medicine

## 2010-04-09 ENCOUNTER — Encounter: Payer: Self-pay | Admitting: Internal Medicine

## 2010-06-04 ENCOUNTER — Telehealth: Payer: Self-pay | Admitting: Internal Medicine

## 2010-06-16 ENCOUNTER — Ambulatory Visit: Payer: Self-pay | Admitting: Internal Medicine

## 2010-06-16 DIAGNOSIS — Z8679 Personal history of other diseases of the circulatory system: Secondary | ICD-10-CM | POA: Insufficient documentation

## 2010-06-16 DIAGNOSIS — I471 Supraventricular tachycardia: Secondary | ICD-10-CM | POA: Insufficient documentation

## 2010-09-13 ENCOUNTER — Encounter: Payer: Self-pay | Admitting: Internal Medicine
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
## 2010-09-16 NOTE — Progress Notes (Signed)
  Phone Note Call from Patient Call back at Home Phone [phone] Call back at [phone]   Caller: Patient Call For: Evany Schecter Summary of Call: PT WAS SEEN ON 8/28 HAS QUESTIONS ABOUT RX MED PLS CALL Initial call taken by: Shan Levans,  April 15, 2007 8:42 AM         Appended Document:  Pt called with questions about his meds..........after reading his instruction sheet.  He understood when he left the office to  1. dc gemfibrozil 2. continue Cymbalta 3. take 1/2 of Lisinopril 10 mg. .........daily  His instruction sheet was different?  Advised that I would talk to Dr. Cato Mulligan, and if there are any change, I would call him back.  If not .......continue as above.

## 2010-09-16 NOTE — Assessment & Plan Note (Signed)
Summary: ROA AFTER DR Marikay Alar APPT/JLS  Medications Added ADVIL 200 MG TABS (IBUPROFEN) Take 1 three times a day,prn ALPRAZOLAM 1 MG TABS (ALPRAZOLAM) Take 1 tab by mouth at bedtime LISINOPRIL 10 MG TABS (LISINOPRIL) 1 daily TYLENOL GO TABS EXTRA STRENGTH 500 MG  CHEW (ACETAMINOPHEN)  TOPROL XL 25 MG  TB24 (METOPROLOL SUCCINATE) Take 1 tablet by mouth every morning        Vital Signs:  Patient Profile:   45 Years Old Male Weight:      182 pounds (82.73 kg) Temp:     98.2 degrees F (36.78 degrees C) oral Pulse rate:   76 / minute BP sitting:   148 / 96  (right arm)  Vitals Entered By: Arcola Jansky, RN (September 08, 2007 11:33 AM)                 Chief Complaint:  1) HEART SURG. NEXT FRI (SVT ABLATION)  2)  DIARREHA X 1 WK , RUNNY NOSE, and NAUSEA TODAY.  History of Present Illness: Current Problems:  SYNCOPE (ICD-780.2) -evaluation with Dr. Graciela Husbands HYPERLIPIDEMIA (ICD-272.4)-medications without difficulty. BACK PAIN (ICD-724.5)-long-term problem followed by pain management. HYPERTENSION (ICD-401.9)-no headache or neurologic deficits. SVT---no sxs but scheduled for ablation procedure   Past Medical History: chronic pain syndrome--pain clinic blood transfusion 1960's Hypertension Nephrolithiasis, hx of Hyperlipidemia  Social History: disabeled Married Never Smoked   Family History: mother RA father osteoporosis     Current Allergies: No known allergies      Review of Systems       no other complaints in a complete ROS    Physical Exam  General:     Well-developed,well-nourished,in no acute distress; alert,appropriate and cooperative throughout examination Head:     normocephalic and atraumatic.   Eyes:     pupils equal and pupils round.   Neck:     No deformities, masses, or tenderness noted. Lungs:     Normal respiratory effort, chest expands symmetrically. Lungs are clear to auscultation, no crackles or wheezes. Heart:     Normal rate  and regular rhythm. S1 and S2 normal without gallop, murmur, click, rub or other extra sounds. Abdomen:     Bowel sounds positive,abdomen soft and non-tender without masses, organomegaly or hernias noted. Msk:     marked scoliosis of the back. Pulses:     R radial normal and L radial normal.   Extremities:     No clubbing, cyanosis, edema, or deformity noted with normal full range of motion of all joints.   Skin:     Intact without suspicious lesions or rashes Cervical Nodes:     no anterior cervical adenopathy and no posterior cervical adenopathy.      Impression & Recommendations:  Problem # 1:  DIARRHEA (ICD-787.91) new complaint. o recent exposures.  I've acterial infection.  No recent exposures.  I've advised him not to take anything for this. Orders: Venipuncture (43154) TLB-BMP (Basic Metabolic Panel-BMET) (80048-METABOL) TLB-Hepatic/Liver Function Pnl (80076-HEPATIC)   Problem # 2:  SYNCOPE (ICD-780.2) he tells me he's been diagnosed with SVT and is scheduled for SVT ablation next week.  Problem # 3:  HYPERLIPIDEMIA (ICD-272.4)  His updated medication list for this problem includes:    Gemfibrozil 600 Mg Tabs (Gemfibrozil) ..... One by mouth daily  Labs Reviewed: Chol: 192 (07/26/2007)   HDL: 32.3 (07/26/2007)   LDL: 131 (07/26/2007)   TG: 145 (07/26/2007) SGOT: 27 (07/26/2007)   SGPT: 27 (07/26/2007)   Problem # 4:  BACK PAIN (ICD-724.5) follow up with pain clinic. His updated medication list for this problem includes:    Advil 200 Mg Tabs (Ibuprofen) .Marland Kitchen... Take 1 three times a day,prn    Morphine Sulfate 30 Mg Tabs (Morphine sulfate) .Marland Kitchen... Prn    Tylenol Go Tabs Extra Strength 500 Mg Chew (Acetaminophen)   Complete Medication List: 1)  Advil 200 Mg Tabs (Ibuprofen) .... Take 1 three times a day,prn 2)  Alprazolam 1 Mg Tabs (Alprazolam) .... Take 1 tab by mouth at bedtime 3)  Cymbalta 60 Mg Cpep (Duloxetine hcl) .... Take 1 capsule by mouth once a day 4)   Gemfibrozil 600 Mg Tabs (Gemfibrozil) .... One by mouth daily 5)  Lisinopril 10 Mg Tabs (Lisinopril) .Marland Kitchen.. 1 daily 6)  Morphine Sulfate 30 Mg Tabs (Morphine sulfate) .... Prn 7)  Nystatin-triamcinolone 100000-0.1 Unit/gm-% Crea (Nystatin-triamcinolone) .... Apply two times a day for 14 days 8)  Tylenol Go Tabs Extra Strength 500 Mg Chew (Acetaminophen) 9)  Toprol Xl 25 Mg Tb24 (Metoprolol succinate) .... Take 1 tablet by mouth every morning     Prescriptions: CYMBALTA 60 MG CPEP (DULOXETINE HCL) Take 1 capsule by mouth once a day  #90 x 3   Entered and Authorized by:   [name]   Signed by:   [name] on 09/08/2007   Method used:   Elect[facility] to ...       [facility] [address] [id number]       [address]       [place], [place]  [address]       Ph: [phone] or [phone]       Fax: [phone]   RxID:   [id number] LISINOPRIL 10 MG TABS (LISINOPRIL) 1 daily  #100 x 3   Entered and Authorized by:   [name]   Signed by:   [name] on 09/08/2007   Method used:   Electronically sent to ...       [facility]  [address] [id number]       [address]       [place], [place]  [address]       Ph: [phone] or [phone]       Fax: [phone]   RxID:   [id number]  ]

## 2010-09-16 NOTE — Letter (Signed)
Summary: [facility]   Imported By: Maryln Gottron 12/19/2009 15:41:11  _____________________________________________________________________  External Attachment:    Type:   Image     Comment:   External Document

## 2010-09-16 NOTE — Progress Notes (Signed)
Summary: REQ FOR RETURN CALL???  Phone Note Call from Patient   Caller: Patient  [phone] Reason for Call: Talk to Doctor Summary of Call: Pt called in today to speak with Dr Cato Mulligan.... Pt adv that he is looking for a new pain management physician / facility...Marland KitchenMarland KitchenPt advises that he is not due to come in for an appt with Dr Cato Mulligan till next month but he will need to be referred to a pain management physician / facility asap...Marland KitchenMarland KitchenPt wants to make sure Dr Cato Mulligan is aware of conversation that he had with Dr Clent Ridges at his last ov (11/11/2009) at [facility] (documented in progress notes / EMR)?  Pt req a return call at [phone].   Initial call taken by: Debbra Riding,  November 27, 2009 2:58 PM  Follow-up for Phone Call        refer to pain management Follow-up by: [name],  November 28, 2009 6:20 AM  Additional Follow-up for Phone Call Additional follow up Details #1::        Patient notified.   Sees mark phillips at [facility] now.  requests different phys and facility.  order in process. Additional Follow-up by: Gladis Riffle, RN,  November 28, 2009 11:49 AM

## 2010-09-16 NOTE — Progress Notes (Signed)
Summary: [facility] called and said that referral has been rcvd  Phone Note From Other Clinic Call back at [phone]  [facility] -Toniann Fail   Caller: Malcolm Metro Summary of Call: Toniann Fail with [facility] called and said that she rcvd referal for [name] White. Pt is sch to be seen on 04/08/10 by Dr. Nilsa Nutting.     Initial call taken by: Lucy Antigua,  March 26, 2010 2:27 PM  Follow-up for Phone Call        Pt aware. Follow-up by: Corky Mull,  March 26, 2010 4:41 PM

## 2010-09-16 NOTE — Letter (Signed)
Summary: [facility]   Imported By: Maryln Gottron 01/29/2010 13:25:36  _____________________________________________________________________  External Attachment:    Type:   Image     Comment:   External Document

## 2010-09-16 NOTE — Letter (Signed)
Summary: DMV FORMS  DMV FORMS   Imported By: Lysle Rubens 12/27/2009 16:19:26  _____________________________________________________________________  External Attachment:    Type:   Image     Comment:   External Document

## 2010-09-16 NOTE — Progress Notes (Signed)
Summary: request  Phone Note Call from Patient Call back at Work Phone [phone]   Caller: Patient---live call Summary of Call: wants ellen to call him. he said that he left a detailed message @4 :50 pm yesterday. Initial call taken by: Warnell Forester,  January 29, 2010 10:44 AM  Follow-up for Phone Call        Needs Rx dilaudid 4mg  every 4 hours with limit of five per day on 01/31/10 to last until sees pain MD on 02/06/10.  Call when ready for pick up.  Paperwork is for dr Dorcus Riga to see what was said about this from pain clinic.  If not needed for EMR, ok to shred per pt. Follow-up by: Gladis Riffle, RN,  January 29, 2010 12:36 PM  Additional Follow-up for Phone Call Additional follow up Details #1::        Pt called back. needs the paperwork. wants Alvino Chapel to call to explain. Additional Follow-up by: Warnell Forester,  January 29, 2010 12:55 PM    Additional Follow-up for Phone Call Additional follow up Details #2::    spoke with pt.  He is worried because was told by Dr Wynn Banker' office that pain med would not be written by them until 10 days after appt there.  He will run out of dilaudid on Fri and now will need at least 17 days supply from Dr Cato Mulligan. Also coming to sign to release our recent cpx and labs and dmv forms to dr Wynn Banker. Follow-up by: Gladis Riffle, RN,  January 29, 2010 2:14 PM  Additional Follow-up for Phone Call Additional follow up Details #3:: Details for Additional Follow-up Action Taken: ok to fill until he sees dr kirsteins---let's see what he says Additional Follow-up by: [name],  January 29, 2010 3:39 PM  Prescriptions: DILAUDID 4 MG TABS (HYDROMORPHONE HCL) one tab every 4 hours, limit 5 tabs daily  #35 x 0   Entered by:   Gladis Riffle, RN   Authorized by:   [name]   Signed by:   Gladis Riffle, RN on 01/30/2010   Method used:   Print then Give to Patient   RxID:   [id number]  see Rx.  Patient notified would be ready for pick up in AM when  signed.

## 2010-09-16 NOTE — Progress Notes (Signed)
Summary: med not working sw pt  Phone Note Call from Patient Call back at Work Phone [phone]   Caller: vm Call For: Shaunta Oncale for [facility] Summary of Call: Med given Mon ov violent infection I have not working,  NS & cough suppressant.  Request Zpak of Levaquin  [facility]   Initial call taken by: Rudy Jew, RN,  January 22, 2010 2:38 PM  Follow-up for Phone Call        per dr Ander Slade- may have z pack- sent through open ov from yesterday Follow-up by: Willy Eddy, LPN,  January 23, 2535 5:47 PM     Appended Document: med not working sw pt pt informed

## 2010-09-16 NOTE — Progress Notes (Signed)
Summary: Pt req antibiotic called in asap to [facility]  Phone Note Call from Patient Call back at [facility] Phone [phone]   Caller: Patient Summary of Call: Pt is req to get an antibiotic called in asap. The prednisone and allegra is not helping at all. [facility] [place].  Initial call taken by: Lucy Antigua,  November 11, 2009 9:00 AM  Follow-up for Phone Call        Pt is coming in for sda appt w/ Dr Clent Ridges today at 11:30am.  Follow-up by: Debbra Riding,  November 11, 2009 10:48 AM

## 2010-09-16 NOTE — Assessment & Plan Note (Signed)
Summary: cpx//ccm rsc bmp/njr   Vital Signs:  Patient profile:   45 year old male Height:      68 inches Weight:      177 pounds BMI:     27.01 Pulse rate:   68 / minute Pulse rhythm:   regular Resp:     12 per minute BP sitting:   128 / 86  (left arm) Cuff size:   regular  Vitals Entered By: Gladis Riffle, RN (Jan 06, 2010 8:16 AM) CC: cpx, labs done--has form Is Patient Diabetic? No   CC:  cpx and labs done--has form.  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 Take 1 Three Times A Day,prn 2)  Alprazolam 1 Mg Tabs (Alprazolam) .... Take 1 Tab By Mouth At Bedtime 3)  Lisinopril 10 Mg Tabs (Lisinopril) .Marland Kitchen.. 1 Daily 4)  Tylenol Go Tabs Extra Strength 500 Mg  Chew (Acetaminophen) 5)  Dilaudid 4 Mg Tabs (Hydromorphone Hcl) .... One Tab Every 4 Hours, Limit 5 Tabs Daily  Allergies (verified): No Known Drug Allergies  Past History:  Past Medical History: Last updated: 07/27/2008 chronic pain syndrome--pain clinic blood transfusion 1960's Hypertension Nephrolithiasis, hx of Hyperlipidemia Current Problems:  * NONSUSTAINED AFIB SVT/ PSVT/ PAT (ICD-427.0) CARDIOMYOPATHY, SECONDARY (ICD-425.9) SYNCOPE (ICD-780.2) PAROTITIS (ICD-527.2) DERMATOPHYTOSIS, GROIN (ICD-110.3) HYPERLIPIDEMIA (ICD-272.4) BACK PAIN (ICD-724.5) NEPHROLITHIASIS, HX OF (ICD-V13.01) HYPERTENSION (ICD-401.9)  Past Surgical History: Last updated: 07/27/2008 back surgery 01/12/00 back surgeries 1982 Total knee replacement  1999 AV Nodal Ablation 09/16/2007  Family  History: Last updated: 04/14/2007 mother RA father osteoporosis  Social History: Last updated: 07/27/2008 disabeled Married  Tobacco Use - Former.  Alcohol Use - yes Narcotic Dependent  Risk Factors: Smoking Status: quit (01/06/2010)   Impression & Recommendations:  Problem # 1:  PREVENTIVE HEALTH CARE (ICD-V70.0) health maint UTD encouraged continued excellent health habits/regular exercise, etc...  Problem # 2:  HYPERLIPIDEMIA (ICD-272.4) controlled on no meds---remove from problem list  Problem # 3:  HYPERTENSION (ICD-401.9) adequate control continue current medications  His updated medication list for this problem includes:    Lisinopril 10 Mg Tabs (Lisinopril) .Marland Kitchen... 1 daily  BP today: 128/86 Prior BP: 148/88 (11/11/2009)  Labs Reviewed: K+: 4.9 (12/26/2009) Creat: : 0.9 (12/26/2009)   Chol: 177 (12/26/2009)   HDL: 57.20 (12/26/2009)   LDL: 100 (12/26/2009)   TG: 99.0 (12/26/2009)  Complete Medication List: 1)  Advil 200 Mg Tabs (Ibuprofen) .... Take 1 three times a day,prn 2)  Alprazolam 1 Mg Tabs (Alprazolam) .... Take 1 tab by mouth at bedtime 3)  Lisinopril 10 Mg Tabs (Lisinopril) .Marland Kitchen.. 1 daily 4)  Tylenol Go Tabs Extra Strength 500 Mg Chew (Acetaminophen) 5)  Dilaudid 4 Mg Tabs (Hydromorphone hcl) .... One tab every 4 hours, limit 5 tabs daily  Other Orders: Tdap => 84yrs IM (84132) Admin 1st Vaccine (44010)  Patient Instructions: 1)  Please schedule a follow-up appointment in 4 months. f/u bp   Immunizations Administered:  Tetanus Vaccine:    Vaccine Type: Tdap    Site: left deltoid    Mfr: GlaxoSmithKline    Dose: 0.5 ml  Route: IM    Given by: Gladis Riffle, RN    Exp. Date: 11/09/2011    Lot #: [id number]  Physical Exam General Appearance: well developed, well nourished, no acute distress Eyes: conjunctiva and lids normal, PERRLA, EOMI,  Ears, Nose, Mouth, Throat: TM clear, nares clear, oral exam WNL Neck: supple, no lymphadenopathy, no  thyromegaly, no JVD Respiratory: clear to auscultation and percussion, respiratory effort normal Cardiovascular: regular rate and rhythm, S1-S2, no murmur, rub or gallop, no bruits, peripheral pulses normal and symmetric, no cyanosis, clubbing, edema or varicosities Chest: no scars, masses, tenderness; no asymmetry, skin changes, , no gynecomastia   Gastrointestinal: soft, non-tender; no hepatosplenomegaly, masses; active bowel sounds all quadrants, ; no masses, tenderness, hemorrhoids  Genitourinary: no hernia,or prostate enlargement Lymphatic: no cervical, axillary or inguinal adenopathy Musculoskeletal: gait normal, muscle tone and strength WNL, no joint swelling, effusions, discoloration, crepitus , scar along entire length of spine, marked scoliosis Skin: clear, good turgor, color WNL, no rashes, lesions, or ulcerations Neurologic: normal mental status, normal reflexes, normal strength, sensation, and motion Psychiatric: alert; oriented to person, place and time Other Exam:

## 2010-09-16 NOTE — Letter (Signed)
Summary: [facility]  [facility]   Imported By: Maryln Gottron 01/29/2010 13:28:00  _____________________________________________________________________  External Attachment:    Type:   Image     Comment:   External Document

## 2010-09-16 NOTE — Progress Notes (Signed)
Summary: refill Dilaudid  Phone Note Call from Patient   Caller: Patient Call For: [name] Summary of Call: Pt is requesting pain meds, and does not understand why he is being considered a non narcotic pt.  Pt failed a drug screen at Dr. Vear Clock office, and states he has not taken Oxycodone in years.  He is going to run out of Dilaudid in a few days.  Wants to know what Dr. Cato Mulligan would recommend?  In the meantime, he will run out of Dilaudid on Monday, and is asking if he can have enough until he can see Dr. Cato Mulligan.   [phone]   Initial call taken by: Lynann Beaver CMA,  February 13, 2010 11:35 AM  Follow-up for Phone Call        ANY further refills will have to be through Dr Cato Mulligan or Dr Vear Clock. Follow-up by: Evelena Peat MD,  February 13, 2010 5:51 PM  Additional Follow-up for Phone Call Additional follow up Details #1::        Pt called back to check on status of call.... Pt was informed that he will need to go through Dr Cato Mulligan or Dr Vear Clock for any further refills... Pt adv that he will call Dr Vear Clock office to see if he can obtain a Rx for pain meds.  Additional Follow-up by: Debbra Riding,  February 14, 2010 12:17 PM    Additional Follow-up for Phone Call Additional follow up Details #2::    noted Follow-up by: Evelena Peat MD,  February 14, 2010 1:13 PM

## 2010-09-16 NOTE — Progress Notes (Signed)
Summary: Call-A-Nurse Report  Call-A-Nurse Triage Call Report Triage Record Num: [id number] Operator: Lelon Perla Patient Name: [name] White Call Date & Time: 11/10/2009 7:11:35AM Patient Phone: [phone] PCP: Valetta Mole. Yailine Ballard Patient Gender: Male PCP Fax : [phone] Patient DOB: 16-Sep-1965 Practice Name: [facility] Reason for Call: Jylan calling seen on Thursday for sinus infection and was put on allegra - and has today green nasal discharge and says he feels like he has a low grade temp - this am he says he feels bad and has sinus pressure - care advice given and advised to see physician within 24 - advised to go to [facility] Protocol(s) Used: Upper Respiratory Infections / Colds Recommended Outcome per Protocol: See Provider within 24 hours Reason for Outcome: Facial pain (fullness, pressure, worsens with bending over), frontal headache, yellow-green nasal discharge AND any temperature elevation Care Advice:  ~ Use a cool mist humidifier to moisten air. Be sure to clean according to manufacturer's instructions.  ~ See provider in 8 hours if redness, swelling and tenderness develops above or below eyes/near ears/over sinuses  ~ Consider use of a saline nasal spray per package directions to help relieve nasal congestion. Consider acetaminophen as directed on label or by pharmacist/provider for pain or fever. PRECAUTIONS: - Use only If there is no history of liver disease, alcoholism, or intake of three or more alcohol drinks per day. - If approved by provider when breastfeeding. - Do not exceed recommended dose or frequency.  ~ Analgesic Advice: Consider aspirin, ibuprofen, naproxen or ketoprofen for pain or fever as directed on label or by pharmacist/provider. PRECAUTION: - If over 34 years of age, should not take longer than 1 week without consulting provider. EXCEPTIONS: - Should not be used if taking blood thinners. - Or if have history of sensitivity/allergy  to any of these medications; or history of ulcer or kidney disease.  ~  ~ SYMPTOM / CONDITION MANAGEMENT  ~ CAUTIONS A warm, moist compress placed on face, over eyes for 15 to 20 minutes, 5 to 6 times a day, may help relieve the congestion.  ~ 11/10/2009 7:32:28AM Page 1 of 1 [email or web address]

## 2010-09-16 NOTE — Progress Notes (Signed)
Summary: Cymbalta question  Phone Note Call from Patient Call back at Home Phone [phone]   Caller: Patient Call For: [name] Summary of Call: Pt questioning if Dr Cato Mulligan would recommend he begin to wean off the Cymbalta, pt does not think it really helps and is worth the $.  Initial call taken by: Sid Falcon LPN,  August 21, 2008 4:57 PM  Follow-up for Phone Call        can decrease to 30 mg by mouth once daily for 2 weeks and then stop will need to call in 30mg  Rx for 14 days Follow-up by: [name],  August 21, 2008 4:59 PM  Additional Follow-up for Phone Call Additional follow up Details #1::        Pt informed and he voiced his appreciation.  Rx sent electronically Additional Follow-up by: Sid Falcon LPN,  August 21, 2008 5:08 PM    New/Updated Medications: CYMBALTA 30 MG CPEP (DULOXETINE HCL) one tab daily for 2 weeks, then may stop   Prescriptions: CYMBALTA 30 MG CPEP (DULOXETINE HCL) one tab daily for 2 weeks, then may stop  #14 x 0   Entered by:   Sid Falcon LPN   Authorized by:   [name]   Signed by:   Sid Falcon LPN on 09/81/1914   Method used:   [facility]        [facility] [address] [facility]retail[facility]       [address]       [place], [place]  [address]       Ph: [phone] or [phone]       Fax: [phone]   RxID:   [id number]

## 2010-09-16 NOTE — Progress Notes (Signed)
Summary: Pt called re: Dilaudid and req refill of Alprazolam and Zanaflex  Phone Note Call from Patient Call back at [phone] cell   Caller: Patient Summary of Call: Pt called re: the narcotic med that he is taking. Pt also req refills of Alprazolam and Zanaflex. Pt said that he contacted pain clinic today. Pt appt hasnt been sch yet, and was wondering if Dr. Cato Mulligan or Alvino Chapel can contact pain clinic and get pt in sooner.  Pt out of Dilaudid and wants to know if he can get refill before appt with pain clinic? Initial call taken by: Lucy Antigua,  February 19, 2010 11:17 AM  Follow-up for Phone Call        was told last time Dr Cato Mulligan was not comfortable renewal of dilaudid and he would need to find some pain MD to do.  What about other meds?  OKI? Follow-up by: Gladis Riffle, RN,  February 19, 2010 12:59 PM  Additional Follow-up for Phone Call Additional follow up Details #1::        per dr Tiaria Biby ok to refill alprazolam and zanaflex x1.Gladis Riffle, RN  February 20, 2010 12:10 PM     Additional Follow-up for Phone Call Additional follow up Details #2::    pt is return ellen call Follow-up by: Heron Sabins,  February 20, 2010 12:42 PM  Additional Follow-up for Phone Call Additional follow up Details #3:: Details for Additional Follow-up Action Taken: left message on machine for patient  Additional Follow-up by: Kern Reap CMA Duncan Dull),  February 20, 2010 5:00 PM  Prescriptions: ALPRAZOLAM 1 MG TABS (ALPRAZOLAM) Take 1 tab by mouth at bedtime  #30 x 0   Entered by:   Kern Reap CMA (AAMA)   Authorized by:   Gladis Riffle, RN   Signed by:   Kern Reap CMA (AAMA) on 02/20/2010   Method used:   Telephoned to ...       [facility]  [phone]* (retail)       [address] [place], [place]  [address]       Ph: [phone] or [phone]       Fax: [phone]   RxID:   [id number] ZANAFLEX 4 MG TABS (TIZANIDINE HCL) One every eight hours as directed.  #90 x 0   Entered by:    Kern Reap CMA (AAMA)   Authorized by:   Gladis Riffle, RN   Signed by:   Kern Reap CMA (AAMA) on 02/20/2010   Method used:   Telephoned to ...       [facility]  [phone]* (retail)       [address] [place], [place]  [address]       Ph: [phone] or [phone]       Fax: [phone]   RxID:   [id number]

## 2010-09-16 NOTE — Letter (Signed)
Summary: [facility]  [facility]   Imported By: Maryln Gottron 07/09/2010 15:21:33  _____________________________________________________________________  External Attachment:    Type:   Image     Comment:   External Document

## 2010-09-16 NOTE — Assessment & Plan Note (Signed)
Summary: dmv form completion/njr   Vital Signs:  Patient profile:   45 year old male Height:      68 inches Weight:      177 pounds BMI:     27.01  Vitals Entered By: Gladis Riffle, RN (Dec 27, 2009 2:00 PM)  Current Problems (verified): 1)  Svt/ Psvt/ Pat  (ICD-427.0) 2)  Cardiomyopathy, Secondary  (ICD-425.9) 3)  Hyperlipidemia  (ICD-272.4) 4)  Back Pain  (ICD-724.5) 5)  Nephrolithiasis, Hx of  (ICD-V13.01) 6)  Hypertension  (ICD-401.9)  Current Medications (verified): 1)  Advil 200 Mg Tabs (Ibuprofen) .... Take 1 Three Times A Day,prn 2)  Alprazolam 1 Mg Tabs (Alprazolam) .... Take 1 Tab By Mouth At Bedtime 3)  Lisinopril 10 Mg Tabs (Lisinopril) .Marland Kitchen.. 1 Daily 4)  Tylenol Go Tabs Extra Strength 500 Mg  Chew (Acetaminophen) 5)  Norco 10-325 Mg Tabs (Hydrocodone-Acetaminophen) .Marland Kitchen.. 1 Every 6 Hrs As Needed For Pain 6)  Dilaudid 4 Mg Tabs (Hydromorphone Hcl) .... One Tab Every 4 Hours, Limit 5 Tabs Daily 7)  Prednisone 10 Mg Tabs (Prednisone) .... Taper As Follows:  4-4-4-3-3-2-2-1-1  Allergies: No Known Drug Allergies   Impression & Recommendations:  Problem # 1:  SVT/ PSVT/ PAT (ICD-427.0) s/p ablation forms completed  Problem # 2:  BACK PAIN (ICD-724.5)  chronic -f/u pain clinic The following medications were removed from the medication list:    Norco 10-325 Mg Tabs (Hydrocodone-acetaminophen) .Marland Kitchen... 1 every 6 hrs as needed for pain His updated medication list for this problem includes:    Advil 200 Mg Tabs (Ibuprofen) .Marland Kitchen... Take 1 three times a day,prn    Tylenol Go Tabs Extra Strength 500 Mg Chew (Acetaminophen)    Dilaudid 4 Mg Tabs (Hydromorphone hcl) ..... One tab every 4 hours, limit 5 tabs daily  Problem # 3:  HYPERTENSION (ICD-401.9)  His updated medication list for this problem includes:    Lisinopril 10 Mg Tabs (Lisinopril) .Marland Kitchen... 1 daily form completletion total time 30 minutes all face to face  Complete Medication List: 1)  Advil 200 Mg Tabs  (Ibuprofen) .... Take 1 three times a day,prn 2)  Alprazolam 1 Mg Tabs (Alprazolam) .... Take 1 tab by mouth at bedtime 3)  Lisinopril 10 Mg Tabs (Lisinopril) .Marland Kitchen.. 1 daily 4)  Tylenol Go Tabs Extra Strength 500 Mg Chew (Acetaminophen) 5)  Dilaudid 4 Mg Tabs (Hydromorphone hcl) .... One tab every 4 hours, limit 5 tabs daily

## 2010-09-16 NOTE — Progress Notes (Signed)
Summary: REQ TO D/C MED?  Phone Note Call from Patient   Caller: Patient    [phone] Summary of Call: Pt called to see if he can stop taking his med:  Lisinopril........ Pt feels that it is not helping him?  Pt can be reached at [phone].  Initial call taken by: Debbra Riding,  June 04, 2010 2:45 PM  Follow-up for Phone Call        stay on it until i see him Follow-up by: [name],  June 04, 2010 3:51 PM  Additional Follow-up for Phone Call Additional follow up Details #1::        Pt scheduled appt for 10/31 for consult ref med  with  Dr Cato Mulligan, was advised to stay on med (Lisinopril) till then..... Pt acknowledged same.  Additional Follow-up by: Debbra Riding,  June 04, 2010 4:13 PM

## 2010-09-16 NOTE — Progress Notes (Signed)
Summary: REQ FOR RETURN CALL OR Rx  Phone Note Call from Patient   Caller: Patient  [phone] Summary of Call: Pt called and adv that Dr Porfirio Oar office Marisue Ivan) advised the patient that he is currently being treated as a non-narcotic patient until he has had an injection for pain (scheduled for 03/17/2010) and taken other measures for pain.... Pt states that he has been on narcotics for 12 years and is currently out of his medication and would like to have Rx prepared for p/u because he cannot get the Rx filled by Dr Tiajuana Amass office (he doesn't want to go through withdrawls).... Pt is worried that this is all a result of his failed drug (urine)  test (tested positive - oxycodone?) that was given / performed by Dr Vear Clock.... Pt adv that if this is how it is going to be then he wants to have a referral to another pain mgmt clinic or back to Dr Vear Clock...?     Pt would like a return call to [phone].  Initial call taken by: Debbra Riding,  February 07, 2010 2:14 PM  Follow-up for Phone Call        He will need a pain mgmt physician to write RX. Can be dr Vear Clock if he chooses. Please write #20...ask Dr. Caryl Never to sign on my behalf Follow-up by: [name],  February 07, 2010 2:57 PM  Additional Follow-up for Phone Call Additional follow up Details #1::        called to inform pt.  He states takes 5 per day so #20 only last four days.  Asking if can have enough to last until Dr Cato Mulligan back in office on 02/18/10.Gladis Riffle, RN  February 07, 2010 3:51 PM   Per Dr Cato Mulligan ok to give #50 to cover the 10 days, but then will have to get pain med from Dr Vear Clock until transfers all pain mgt to Dr Patrina Levering.  Patient notified.   Will pick up Rx today. Additional Follow-up by: Gladis Riffle, RN,  February 07, 2010 3:54 PM    Prescriptions: DILAUDID 4 MG TABS (HYDROMORPHONE HCL) one tab every 4 hours, limit 5 tabs daily  #50 x 0   Entered and Authorized by:   Evelena Peat MD   Signed by:   Evelena Peat MD on 02/07/2010   Method used:   Print then Give to Patient   RxID:   [id number]

## 2010-09-16 NOTE — Assessment & Plan Note (Signed)
Summary: flu shot/njr   Nurse Visit  Flu Vaccine Consent Questions     Do you have a history of severe allergic reactions to this vaccine? no    Any prior history of allergic reactions to egg and/or gelatin? no    Do you have a sensitivity to the preservative Thimersol? no    Do you have a past history of Guillan-Barre Syndrome? no    Do you currently have an acute febrile illness? no    Have you ever had a severe reaction to latex? no    Vaccine information given and explained to patient? yes    Are you currently pregnant? no    Lot Number:[id number]   Exp Date:02/13/2010   Site Given  Left Deltoid IM    Allergies: No Known Drug Allergies  Orders Added: 1)  Flu Vaccine 80yrs + [90658] 2)  Administration Flu vaccine - MCR [G0008]

## 2010-09-16 NOTE — Progress Notes (Signed)
Summary: status os pain management referral  Phone Note Call from Patient   Caller: Patient Summary of Call: INFO ONLY...Marland KitchenMarland KitchenPt called back to adv that he hasn't heard anything in reference to his appt with pain clinic.... I explained to pt that it takes a while for everything to be processed and that he would be contacted when the appt has been set up.... Pt adv that he wants to let [name] Chapel, RN know that he doesn't have an appt yet...?   Initial call taken by: [name] White,  March 17, 2010 9:23 AM  Follow-up for Phone Call        noted.  Records were faxed to Dr Vear Clock last week.   [name] Riffle, RN  March 17, 2010 2:17 PM

## 2010-09-16 NOTE — Consult Note (Signed)
Summary: [facility]  [facility]   Imported By: Maryln Gottron 06/02/2010 10:08:36  _____________________________________________________________________  External Attachment:    Type:   Image     Comment:   External Document

## 2010-09-16 NOTE — Progress Notes (Signed)
Summary: REQ FOR REFILL / ORDER FOR X-RAY?  Phone Note Refill Request Message from:  Patient on February 06, 2010 1:48 PM  Refills Requested: Medication #1:  DILAUDID 4 MG TABS one tab every 4 hours   Notes: Pt can be reached at [phone].    Caller: Patient   [phone] Summary of Call: Pt called to req a Rx for med: DILAUDID 4 MG ..... Pt also would like to have an order for a x-ray of his neck, stated that Dr Wynn Banker adv pt should have x-ray of neck due to sxs of L shoulder / neck  pain w/ movement..... Pt was offered OV but declined stating that he saw Dr Wynn Banker for neck / shoulder pain, he just needs an order for the x-ray.  Initial call taken by: Debbra Riding,  February 06, 2010 1:53 PM  Follow-up for Phone Call        xray ok dilaudid to be filled by pain mgmt, not by me Follow-up by: [name],  February 06, 2010 4:55 PM  Additional Follow-up for Phone Call Additional follow up Details #1::        Called pt and adv him of Dr Cato Mulligan notation.... Pt adv that he will call Dr Vear Clock (pain mgmt dr) for Dilaudid Rx.Marland KitchenMarland KitchenPt is concerned about his back pain more than his neck pain but he still wants to have the x-ray...Marland KitchenMarland Kitchen pt req to be notified at [phone] when order for x-ray is ready.... Marland KitchenPt can be reached at [phone].  Additional Follow-up by: Debbra Riding,  February 07, 2010 8:05 AM

## 2010-09-16 NOTE — Progress Notes (Signed)
Summary: Pt checking on status of Prednisone. Needs this today.   Phone Note Call from Patient Call back at [facility] Phone [phone]   Caller: Patient Summary of Call: Pt called and is needing that script for predinisone. Pt has been up to [facility] and was told that it wasnt sent in yet. Please call it in asap today.             Initial call taken by: Lucy Antigua,  November 07, 2009 3:17 PM  Follow-up for Phone Call        reassured pt his rx was sent  while he was in office today. Follow-up by: Evelena Peat MD,  November 07, 2009 5:46 PM

## 2010-09-16 NOTE — Progress Notes (Signed)
Summary: Patient has sinus infection/getting no better  Phone Note Call from Patient Call back at Home Phone [phone]   Caller: Patient Reason for Call: Acute Illness Summary of Call: Patient seen last Thursday by Dr. Caryl Never for sinus infection and put on Prednisone and Allegra.  Patient is getting no better and has more coughing and green phlem. Please call  Initial call taken by: Everrett Coombe,  November 11, 2009 9:03 AM  Follow-up for Phone Call        Pt called back to adv that he is having ear pain and congestion.... Pt adv that he doesn't want to have to go to ED but he is desperate for some type of relief from sxs of sinus pressure, h/a, productive cough (yellowish, green mucus), lg fever, along w/ ear pain - congested ears.... Pt would like to be seen, even if it is just for an antibiotic injection.... [facility]  Pt can be reached at [phone] with any questions or concerns.  Follow-up by: Debbra Riding,  November 11, 2009 10:16 AM  Additional Follow-up for Phone Call Additional follow up Details #1::        Phone Call Completed----Appt scheduled w/ Dr Clent Ridges (Dr Cato Mulligan n/a) today, 11/11/2009 at 11:30am (sda slot).  Additional Follow-up by: Debbra Riding,  November 11, 2009 10:34 AM

## 2010-09-16 NOTE — Assessment & Plan Note (Signed)
Summary: PAIN MGMT CONCERNS / R HIP PAIN / NECK PAIN // RS   Vital Signs:  Patient profile:   45 year old male Weight:      168 pounds BMI:     25.64 Pulse rate:   96 / minute Pulse rhythm:   regular Resp:     12 per minute BP sitting:   122 / 78  (left arm) Cuff size:   regular  Vitals Entered By: Gladis Riffle, RN (February 26, 2010 8:15 AM) CC: pain management concerns, has been out of dilaudi x 1 week, c/o chills and loss of appetite Is Patient Diabetic? No   CC:  pain management concerns, has been out of dilaudi x 1 week, and c/o chills and loss of appetite.  History of Present Illness: having trouble with pain management saw PMR as non narcotic patient called dr Alwyn Ren not take him back because he changed physicians  in addition he is concerned with lack of sleep (hip pain awakens him)  also concerned with weight loss.  All other systems reviewed and were negative   Preventive Screening-Counseling & Management  Alcohol-Tobacco     Smoking Status: quit  Current Problems (verified): 1)  Preventive Health Care  (ICD-V70.0) 2)  Scoliosis, Thoracolumbar  (ICD-737.30) 3)  Svt/ Psvt/ Pat  (ICD-427.0) 4)  Cardiomyopathy, Secondary  (ICD-425.9) 5)  Hyperlipidemia  (ICD-272.4) 6)  Back Pain  (ICD-724.5) 7)  Nephrolithiasis, Hx of  (ICD-V13.01) 8)  Hypertension  (ICD-401.9)  Current Medications (verified): 1)  Advil 200 Mg Tabs (Ibuprofen) .... Take 1 Three Times A Day,prn 2)  Alprazolam 1 Mg Tabs (Alprazolam) .... Take 1 Tab By Mouth At Bedtime 3)  Lisinopril 10 Mg Tabs (Lisinopril) .Marland Kitchen.. 1 Daily 4)  Tylenol Go Tabs Extra Strength 500 Mg  Chew (Acetaminophen) 5)  Zanaflex 4 Mg Tabs (Tizanidine Hcl) .... One Every Eight Hours As Directed. 6)  Benadryl 25 Mg Tabs (Diphenhydramine Hcl) .... As Needed  Allergies (verified): No Known Drug Allergies  Past History:  Past Medical History: Last updated: 07/27/2008 chronic pain syndrome--pain clinic blood  transfusion 1960's Hypertension Nephrolithiasis, hx of Hyperlipidemia Current Problems:  * NONSUSTAINED AFIB SVT/ PSVT/ PAT (ICD-427.0) CARDIOMYOPATHY, SECONDARY (ICD-425.9) SYNCOPE (ICD-780.2) PAROTITIS (ICD-527.2) DERMATOPHYTOSIS, GROIN (ICD-110.3) HYPERLIPIDEMIA (ICD-272.4) BACK PAIN (ICD-724.5) NEPHROLITHIASIS, HX OF (ICD-V13.01) HYPERTENSION (ICD-401.9)  Past Surgical History: Last updated: 07/27/2008 back surgery 01/12/00 back surgeries 1982 Total knee replacement  1999 AV Nodal Ablation 09/16/2007  Family History: Last updated: 04/14/2007 mother RA father osteoporosis  Social History: Last updated: 07/27/2008 disabeled Married  Tobacco Use - Former.  Alcohol Use - yes Narcotic Dependent  Risk Factors: Smoking Status: quit (02/26/2010)  Review of Systems       describes intermittent cough associated with rhinorrhea chronic pain weight loss (he relates to diminished appetite)  All other systems reviewed and were negative   Physical Exam  General:  Well-developed,well-nourished,in no acute distress; alert,appropriate and cooperative throughout examination Head:  normocephalic and atraumatic.   Eyes:  pupils equal and pupils round.   Ears:  R ear normal and L ear normal.   Neck:  No deformities, masses, or tenderness noted. Chest Wall:  No deformities, masses, tenderness or gynecomastia noted. Lungs:  normal respiratory effort and no intercostal retractions.   Heart:  normal rate and regular rhythm.   Abdomen:  Bowel sounds positive,abdomen soft and non-tender without masses, organomegaly or hernias noted. Msk:  marked scoliosis of the back. Pulses:  R radial normal and L radial normal.  Extremities:  No clubbing, cyanosis, edema, or deformity noted  Neurologic:  cranial nerves II-XII intact and gait normal.   Skin:  turgor normal and color normal.   Psych:  normally interactive and good eye contact.     Impression & Recommendations:  Problem #  1:  BACK PAIN (ICD-724.5)  he has not found a pain clinic I'll refer again His updated medication list for this problem includes:    Advil 200 Mg Tabs (Ibuprofen) .Marland Kitchen... Take 1 three times a day,prn    Tylenol Go Tabs Extra Strength 500 Mg Chew (Acetaminophen)    Zanaflex 4 Mg Tabs (Tizanidine hcl) ..... One every eight hours as directed.  Orders: Pain Clinic Referral (Pain)  Problem # 2:  COUGH (ICD-786.2)  this seems to be a new complaint ongoing for 6 or more weeks may be associated with some similar sxs  Orders: T-2 View CXR (71020TC)  Problem # 3:  HYPERTENSION (ICD-401.9) controlled continue current medications  His updated medication list for this problem includes:    Lisinopril 10 Mg Tabs (Lisinopril) .Marland Kitchen... 1 daily  BP today: 122/78 Prior BP: 116/78 (01/20/2010)  Labs Reviewed: K+: 4.9 (12/26/2009) Creat: : 0.9 (12/26/2009)   Chol: 177 (12/26/2009)   HDL: 57.20 (12/26/2009)   LDL: 100 (12/26/2009)   TG: 99.0 (12/26/2009)  Complete Medication List: 1)  Advil 200 Mg Tabs (Ibuprofen) .... Take 1 three times a day,prn 2)  Alprazolam 1 Mg Tabs (Alprazolam) .... Take 1 tab by mouth at bedtime 3)  Lisinopril 10 Mg Tabs (Lisinopril) .Marland Kitchen.. 1 daily 4)  Tylenol Go Tabs Extra Strength 500 Mg Chew (Acetaminophen) 5)  Zanaflex 4 Mg Tabs (Tizanidine hcl) .... One every eight hours as directed. 6)  Benadryl 25 Mg Tabs (Diphenhydramine hcl) .... As needed 7)  Dilaudid 2 Mg Tabs (Hydromorphone hcl) .... Take 1 tablet by mouth three times a day as needed pain  Other Orders: Venipuncture (72536) TLB-BMP (Basic Metabolic Panel-BMET) (80048-METABOL) TLB-CBC Platelet - w/Differential (85025-CBCD) TLB-Hepatic/Liver Function Pnl (80076-HEPATIC) TLB-TSH (Thyroid Stimulating Hormone) (84443-TSH) TLB-Sedimentation Rate (ESR) (85652-ESR) Prescriptions: DILAUDID 2 MG TABS (HYDROMORPHONE HCL) Take 1 tablet by mouth three times a day as needed pain  #90 x 0   Entered and Authorized by:    [name]   Signed by:   [name] on 02/26/2010   Method used:   Print then Give to Patient   RxID:   [id number]

## 2010-09-16 NOTE — Progress Notes (Signed)
Summary: needs to be worked in with [employer] from Patient Call back at [employer] [phone]   Caller: pt live Call For: Destanee Bedonie  Summary of Call: throat sore and ear hurts and stopped up.  Took a hot shower and when he tried to unclog the ear with a q tip he got blood.  no fever, no head or chest congestion no cough [facility] [address] [facility]  Initial call taken by: Roselle Locus,  October 30, 2008 9:21 AM  Follow-up for Phone Call        schedule OV today or tomorrow---any available Follow-up by: [name],  October 30, 2008 12:13 PM  Additional Follow-up for Phone Call Additional follow up Details #1::        Pt scheduled 11:45 am tomorrow per Alvino Chapel, pt aware and instructed to call and cancell early tomorrow if he cannot make appt time. Additional Follow-up by: Sid Falcon LPN,  October 30, 2008 2:04 PM

## 2010-09-16 NOTE — Progress Notes (Signed)
Summary: pain management  Phone Note From Other Clinic Call back at [phone]   Caller: Marisue Ivan, Dr Patrina Levering office Call For: Haden Suder Summary of Call: Are seeing pt on non-narcotic basis at present.  Pt requests dilaudid.  They have no problem with someone else prescribing at this time.  Request call back concerning this. Initial call taken by: Gladis Riffle, RN,  February 07, 2010 4:55 PM  Follow-up for Phone Call        Called back and office closed.Gladis Riffle, RN  February 07, 2010 4:55 PM   See phone note from today. Follow-up by: Gladis Riffle, RN,  February 07, 2010 4:56 PM  Additional Follow-up for Phone Call Additional follow up Details #1::        spoke with Marisue Ivan Additional Follow-up by: Kern Reap CMA Duncan Dull),  February 11, 2010 9:59 AM

## 2010-09-16 NOTE — Letter (Signed)
Summary: [facility]  [facility]   Imported By: Maryln Gottron 01/29/2010 13:29:08  _____________________________________________________________________  External Attachment:    Type:   Image     Comment:   External Document

## 2010-09-16 NOTE — Progress Notes (Signed)
Summary: leg cramps  Phone Note Call from Patient Call back at [facility] Phone [phone]   Summary of Call: Leg cramps right leg close to shin.  Wakes him 4am.  No swelling, redness, tenderness afterwards.  Not every night, once in awhile.  When it comes, it's excruciating.  Is there some supplemnt or something he needs to be taking?   Initial call taken by: Rudy Jew, RN,  Jan 01, 2010 3:50 PM  Follow-up for Phone Call        otc magnesium oxide two times a day  Follow-up by: [name],  Jan 01, 2010 6:05 PM  Additional Follow-up for Phone Call Additional follow up Details #1::        Left detailed message on personal voice mail. Additional Follow-up by: Lynann Beaver CMA,  Jan 02, 2010 8:22 AM     Appended Document: leg cramps spoke with patient

## 2010-09-16 NOTE — Progress Notes (Signed)
Summary: REQ FOR REFERRAL?  Phone Note Call from Patient Call back at Work Phone [phone] Call back at [phone] cell   Caller: Patient  [phone] Summary of Call: Pt called and adv that Dr Wynn Banker office is advising him that they will only see him as a "non-narcotic" patient.... Therefore, patient wants a referral to another pain mgmnt clinic asap..... Pt can be reached at [phone].  Initial call taken by: Debbra Riding,  February 11, 2010 1:56 PM  Follow-up for Phone Call        Pt found another pain manangement clinic [facility]. Their # is [phone]  and fax is [phone]  attention Toniann Fail.   address is [address]         Follow-up by: Lucy Antigua,  February 14, 2010 2:09 PM  Additional Follow-up for Phone Call Additional follow up Details #1::        ok Additional Follow-up by: [name],  February 18, 2010 3:28 PM    Additional Follow-up for Phone Call Additional follow up Details #2::    order in process.  pt notified  Follow-up by: Gladis Riffle, RN,  February 18, 2010 4:25 PM
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
## 2010-09-16 NOTE — Assessment & Plan Note (Signed)
Summary: F/U ON MED - FLU SHOT // RS   Vital Signs:  Patient profile:   45 year old male Weight:      195 pounds BMI:     29.76 Temp:     97.8 degrees F oral Pulse rate:   76 / minute Pulse rhythm:   regular BP sitting:   134 / 76  Vitals Entered By: Lynann Beaver CMA AAMA (June 16, 2010 11:13 AM) CC: rov Is Patient Diabetic? No Pain Assessment Patient in pain? no        CC:  rov.  History of Present Illness: HTN---no sxs but self dc'd lisinopril..."just didn't pick it up"   note rapid weight gain---says he has stopped milk and that helped him gain weight. ---previously at healthy weight  Back pain---seeing pain mgmt  All other systems reviewed and were negative   Current Problems (verified): 1)  Weight Loss  (ICD-783.21) 2)  Preventive Health Care  (ICD-V70.0) 3)  Scoliosis, Thoracolumbar  (ICD-737.30) 4)  Svt/ Psvt/ Pat  (ICD-427.0) 5)  Cardiomyopathy, Secondary  (ICD-425.9) 6)  Hyperlipidemia  (ICD-272.4) 7)  Back Pain  (ICD-724.5) 8)  Nephrolithiasis, Hx of  (ICD-V13.01) 9)  Hypertension  (ICD-401.9)  Current Medications (verified): 1)  Advil 200 Mg Tabs (Ibuprofen) .... Take 1 Three Times A Day,prn 2)  Alprazolam 1 Mg Tabs (Alprazolam) .... Take 1 Tab By Mouth At [facility] Rx Must Last 30 Days  Ok After 03/22/10 3)  Tylenol Go Tabs Extra Strength 500 Mg  Chew (Acetaminophen) 4)  Zanaflex 4 Mg Tabs (Tizanidine Hcl) .... One Every Eight Hours As Directed. 5)  Benadryl 25 Mg Tabs (Diphenhydramine Hcl) .... As Needed 6)  Dilaudid 2 Mg Tabs (Hydromorphone Hcl) .... Take 1 Tablet By Mouth Three Times A Day As Needed Pain  Allergies (verified): No Known Drug Allergies  Past History:  Past Surgical History: Last updated: 07/27/2008 back surgery 01/12/00 back surgeries 1982 Total knee replacement  1999 AV Nodal Ablation 09/16/2007  Family History: Last updated: 04/14/2007 mother RA father osteoporosis  Social History: Last updated:  07/27/2008 disabeled Married  Tobacco Use - Former.  Alcohol Use - yes Narcotic Dependent  Risk Factors: Smoking Status: quit (02/26/2010)  Past Medical History: chronic pain syndrome--pain clinic blood transfusion 1960's Hypertension Nephrolithiasis, hx of Hyperlipidemia * NONSUSTAINED AFIB SVT/ PSVT/ PAT (ICD-427.0) CARDIOMYOPATHY, SECONDARY (ICD-425.9) HYPERLIPIDEMIA (ICD-272.4) NEPHROLITHIASIS, HX OF (ICD-V13.01) HYPERTENSION (ICD-401.9)  Physical Exam  General:  developed well-nourished male in no acute distress. HEENT exam atraumatic, normocephalic, extract her muscles are intact. Neck is supple. Chest clear to auscultation cardiac exam S1-S2 are regular. Abdominal exam active bowel sounds, soft, overweight. Musculoskeletal exam he has significant kyphoscoliosis. Extremities no clubbing cyanosis or edema.   Impression & Recommendations:  Problem # 1:  SCOLIOSIS, THORACOLUMBAR (ICD-737.30) chronic back pain seeing pain mgmt  Problem # 2:  HYPERLIPIDEMIA (ICD-272.4) last lipid profile was good remove from list Labs Reviewed: SGOT: 24 (02/26/2010)   SGPT: 22 (02/26/2010)   HDL:57.20 (12/26/2009), 32.3 (07/26/2007)  LDL:100 (12/26/2009), 131 (07/26/2007)  Chol:177 (12/26/2009), 192 (07/26/2007)  Trig:99.0 (12/26/2009), 145 (07/26/2007)  Problem # 3:  HYPERTENSION (ICD-401.9) controlled off meds advised weight loss he would be better at closer to 170 pounds.  The following medications were removed from the medication list:    Lisinopril 10 Mg Tabs (Lisinopril) .Marland Kitchen... 1 daily  BP today: 134/76 Prior BP: 122/78 (02/26/2010)  Labs Reviewed: K+: 4.7 (02/26/2010) Creat: : 1.0 (02/26/2010)   Chol: 177 (12/26/2009)   HDL: 57.20 (  12/26/2009)   LDL: 100 (12/26/2009)   TG: 99.0 (12/26/2009)  Complete Medication List: 1)  Advil 200 Mg Tabs (Ibuprofen) .... Take 1 three times a day,prn 2)  Alprazolam 1 Mg Tabs (Alprazolam) .... Take 1 tab by mouth at bedtime--each rx  must last 30 days  ok after 03/22/10 3)  Tylenol Go Tabs Extra Strength 500 Mg Chew (Acetaminophen) 4)  Zanaflex 4 Mg Tabs (Tizanidine hcl) .... One every eight hours as directed. 5)  Benadryl 25 Mg Tabs (Diphenhydramine hcl) .... As needed 6)  Dilaudid 2 Mg Tabs (Hydromorphone hcl) .... Take 1 tablet by mouth three times a day as needed pain  Other Orders: Flu Vaccine 3yrs + MEDICARE PATIENTS (Z6109) Administration Flu vaccine - MCR (U0454)  Patient Instructions: 1)  Please schedule a follow-up appointment in 4 months. Flu Vaccine Consent Questions     Do you have a history of severe allergic reactions to this vaccine? no    Any prior history of allergic reactions to egg and/or gelatin? no    Do you have a sensitivity to the preservative Thimersol? no    Do you have a past history of Guillan-Barre Syndrome? no    Do you currently have an acute febrile illness? no    Have you ever had a severe reaction to latex? no    Vaccine information given and explained to patient? yes    Are you currently pregnant? no    Lot Number:[id number]   Exp Date:02/14/2011   Site Given  Left Deltoid IM  Orders Added: 1)  Flu Vaccine 13yrs + MEDICARE PATIENTS [Q2039] 2)  Administration Flu vaccine - MCR [G0008] 3)  Est. Patient Level III [09811]    .[email or web address]

## 2010-09-16 NOTE — Assessment & Plan Note (Signed)
Summary: cough/congestiong/sneezing/sore throat/cjr   Vital Signs:  Patient profile:   45 year old male Temp:     98.6 degrees F oral BP sitting:   158 / 80  (left arm) Cuff size:   regular  Vitals Entered By: Sid Falcon LPN (November 07, 2009 9:48 AM) CC: Sinus problems, URI symptoms   History of Present Illness:       This is a 45 year old man who presents with URI symptoms.  The patient complains of nasal congestion, purulent nasal discharge, sore throat, and productive cough, but denies earache.  The patient denies fever, dyspnea, and wheezing.  The patient denies the following risk factors for Strep sinusitis: unilateral facial pain and unilateral nasal discharge.    Allergies (verified): No Known Drug Allergies  Past History:  Past Medical History: Last updated: 07/27/2008 chronic pain syndrome--pain clinic blood transfusion 1960's Hypertension Nephrolithiasis, hx of Hyperlipidemia Current Problems:  * NONSUSTAINED AFIB SVT/ PSVT/ PAT (ICD-427.0) CARDIOMYOPATHY, SECONDARY (ICD-425.9) SYNCOPE (ICD-780.2) PAROTITIS (ICD-527.2) DERMATOPHYTOSIS, GROIN (ICD-110.3) HYPERLIPIDEMIA (ICD-272.4) BACK PAIN (ICD-724.5) NEPHROLITHIASIS, HX OF (ICD-V13.01) HYPERTENSION (ICD-401.9)  Social History: Last updated: 07/27/2008 disabeled Married  Tobacco Use - Former.  Alcohol Use - yes Narcotic Dependent PMH reviewed for relevance, PSH reviewed for relevance  Review of Systems      See HPI  Physical Exam  General:  Well-developed,well-nourished,in no acute distress; alert,appropriate and cooperative throughout examination Ears:  External ear exam shows no significant lesions or deformities.  Otoscopic examination reveals clear canals, tympanic membranes are intact bilaterally without bulging, retraction, inflammation or discharge. Hearing is grossly normal bilaterally. Nose:  minimal rhinorrhea. Mouth:  Oral mucosa and oropharynx without lesions or exudates.  Teeth in  good repair. Neck:  No deformities, masses, or tenderness noted. Lungs:  Normal respiratory effort, chest expands symmetrically. Lungs are clear to auscultation, no crackles or wheezes. Heart:  normal rate and regular rhythm.     Impression & Recommendations:  Problem # 1:  ALLERGIC RHINITIS (ICD-477.9) Allegra and brief pred taper.  Complete Medication List: 1)  Advil 200 Mg Tabs (Ibuprofen) .... Take 1 three times a day,prn 2)  Alprazolam 1 Mg Tabs (Alprazolam) .... Take 1 tab by mouth at bedtime 3)  Lisinopril 10 Mg Tabs (Lisinopril) .Marland Kitchen.. 1 daily 4)  Tylenol Go Tabs Extra Strength 500 Mg Chew (Acetaminophen) 5)  Norco 10-325 Mg Tabs (Hydrocodone-acetaminophen) .Marland Kitchen.. 1 every 6 hrs as needed for pain 6)  Dilaudid 4 Mg Tabs (Hydromorphone hcl) .... One tab every 4 hours, limit 5 tabs daily 7)  Prednisone 10 Mg Tabs (Prednisone) .... Taper as follows:  4-4-4-3-3-2-2-1-1  Patient Instructions: 1)  follow up promptly for any fever or worsening symptoms. 2)  Try over ther counter Allegra. Prescriptions: PREDNISONE 10 MG TABS (PREDNISONE) taper as follows:  4-4-4-3-3-2-2-1-1  #24 x 0   Entered and Authorized by:   Evelena Peat MD   Signed by:   Sid Falcon LPN on 47/82/9562   Method used:   [facility]        [facility]  [place] [address] [facility]ail[facility]       [address]       [place], [place]  [address]       Ph: [phone] or [phone]       Fax: [phone]   RxID:   [id number]

## 2010-09-16 NOTE — Progress Notes (Signed)
Summary: REQ FOR RETURN CALL??  Phone Note Call from Patient   Caller: Patient  [phone] Summary of Call: Pt called to speak with Alvino Chapel, RN or Dr Cato Mulligan .... Pt adv that he has discussed a personal matter with Dr Cato Mulligan and was adv to call [facility] to speak with Dr Cato Mulligan if he had any issues or concerns??..... Pt didn't want to elaborate on the reason for the call, just requested that he receive a return call at  [phone].  Initial call taken by: Debbra Riding,  Jan 06, 2010 1:29 PM  Follow-up for Phone Call        Is changing pain clinic and needs refill dilaudi 4mg  one every four hours with limit 5 per day [facility] to last until 6/25 when goes to new clinic. Requests #[phone] ok to do? Follow-up by: Gladis Riffle, RN,  Jan 06, 2010 1:43 PM  Additional Follow-up for Phone Call Additional follow up Details #1::        get records from pain clinic....current dosing Additional Follow-up by: [name],  Jan 06, 2010 4:59 PM    Additional Follow-up for Phone Call Additional follow up Details #2::    Pt will get records and bring them to [place]. Follow-up by: Gladis Riffle, RN,  Jan 07, 2010 3:39 PM

## 2010-09-16 NOTE — Progress Notes (Signed)
Summary: fyi---please note  Phone Note Call from Patient Call back at Home Phone [phone]   Caller: Patient--live call Summary of Call: pt was seen today and please add to his med list what he is still taking: Zanaflex 4 mg 1q8hs as directed.  Initial call taken by: Warnell Forester,  Jan 06, 2010 10:45 AM  Follow-up for Phone Call        added to med list. Follow-up by: Gladis Riffle, RN,  Jan 06, 2010 1:44 PM    New/Updated Medications: ZANAFLEX 4 MG TABS (TIZANIDINE HCL) One every eight hours as directed.

## 2010-09-16 NOTE — Progress Notes (Signed)
Summary: REQ FOR REFERRAL  Phone Note Call from Patient   Caller: Patient  [phone] Summary of Call: Pt called to state that he is having problems sleeping, his neck has been hurting, and his right hip has been hurting as well.... Pt adv that Dr Vear Clock has told him that he will no longer see him because he transferred to another physician.Marland KitchenMarland KitchenMarland KitchenPt doesn't understand why he is being treated as a non-narcotic pt.... Pt adv that he needs a referral to another pain management clinic.   Initial call taken by: Debbra Riding,  February 25, 2010 8:10 AM  Follow-up for Phone Call        ok to refer Follow-up by: [name],  February 25, 2010 2:05 PM  Additional Follow-up for Phone Call Additional follow up Details #1::        Sent to Alvino Chapel, RN [facility], CMA covering) so that order can be put in and sent to [facility].  Additional Follow-up by: Debbra Riding,  February 25, 2010 5:01 PM    Additional Follow-up for Phone Call Additional follow up Details #2::    seen at [facility] today. Follow-up by: Gladis Riffle, RN,  February 26, 2010 12:34 PM

## 2010-09-16 NOTE — Progress Notes (Signed)
Summary: question  Phone Note Refill Request Call back at Home Phone [phone] Message from:  Patient on March 26, 2010 2:22 PM  Refills Requested: Medication #1:  DILAUDID 2 MG TABS Take 1 tablet by mouth three times a day as needed pain.   Dosage confirmed as above?Dosage Confirmed Pt says that he pain management appt on 04/08/10   Method Requested: Pick up at Office Initial call taken by: Lucy Antigua,  March 26, 2010 2:22 PM  Follow-up for Phone Call        30 days up on 03/28/10.  Do you want to fill and date for then?  How many? Follow-up by: Gladis Riffle, RN,  March 26, 2010 2:26 PM  Additional Follow-up for Phone Call Additional follow up Details #1::        can refill to date of pain management appt Additional Follow-up by: [name],  March 26, 2010 4:11 PM    Additional Follow-up for Phone Call Additional follow up Details #2::    That is 12 days  8/12-8/23--see Rx (was done twice to change start date to 03/28/10).  Pt will be notified when ready Follow-up by: Gladis Riffle, RN,  March 27, 2010 7:40 AM  Additional Follow-up for Phone Call Additional follow up Details #3:: Details for Additional Follow-up Action Taken: Patient notified.   will pick up tomorrow. Additional Follow-up by: Gladis Riffle, RN,  March 27, 2010 10:46 AM  New/Updated Medications: DILAUDID 2 MG TABS (HYDROMORPHONE HCL) Take 1 tablet by mouth three times a day as needed pain Prescriptions: DILAUDID 2 MG TABS (HYDROMORPHONE HCL) Take 1 tablet by mouth three times a day as needed pain  #36 x 0   Entered by:   Gladis Riffle, RN   Authorized by:   [name]   Signed by:   Gladis Riffle, RN on 03/27/2010   Method used:   Print then Give to Patient   RxID:   [id number] DILAUDID 2 MG TABS (HYDROMORPHONE HCL) Take 1 tablet by mouth three times a day as needed pain  #36 x 0   Entered by:   Gladis Riffle, RN   Authorized by:   [name]   Signed by:   Gladis Riffle, RN on 03/27/2010  Method used:   Print then Give to Patient   RxID:   [id number]

## 2010-09-16 NOTE — Assessment & Plan Note (Signed)
Summary: SINUSITIS, H/A, LG FEVER, COUGH/CONGESTION // RS   Vital Signs:  Patient profile:   45 year old male Weight:      200 pounds Temp:     98.2 degrees F oral BP sitting:   148 / 88  (left arm) Cuff size:   regular  Vitals Entered By: Raechel Ache, RN (November 11, 2009 12:09 PM) CC: C/o sinus cong, coughing, head pressure- green and orange phlegm. On prednisone and not helping.   History of Present Illness: Here with continued symptoms of a sinus infection. thyis has been going on for about 10 days. he has sinus pressure, HA, PND, ST, blowing green mucus from the nose, and a dry cough. Low grade fevers. He is on Allegra, Mucinex, and a Prednisone taper which was started by Dr. Caryl Never last week. Nothing has helped. He has also used steam from the shower. A second issue he brings up a about his chronic pain control. He has been seeing Dr. Venia Carbon for pain management for 10 years, but a problem recently came up. He states that his drug screen at Dr. Gwenlyn Fudge office tested positive for Oxycodone, and he denies taking any of this for several years. Now Dr. Venia Carbon won't see him, and even the DMV is talking about restriciting his driving priviledges. He asks my advice and if we can refer him to another pain specialist.   Allergies (verified): No Known Drug Allergies  Past History:  Past Medical History: Reviewed history from 07/27/2008 and no changes required. 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 back surgery 01/12/00 back  surgeries 1982 Total knee replacement  1999 AV Nodal Ablation 09/16/2007  Review of Systems  The patient denies anorexia, fever, weight loss, weight gain, vision loss, decreased hearing, hoarseness, chest pain, syncope, dyspnea on exertion, peripheral edema, hemoptysis, abdominal pain, melena, hematochezia, severe indigestion/heartburn, hematuria, incontinence, genital sores, muscle weakness, suspicious skin lesions, transient blindness, difficulty walking, depression, unusual weight change, abnormal bleeding, enlarged lymph nodes, angioedema, breast masses, and testicular masses.    Physical Exam  General:  Well-developed,well-nourished,in no acute distress; alert,appropriate and cooperative throughout examination Head:  Normocephalic and atraumatic without obvious abnormalities. No apparent alopecia or balding. Eyes:  No corneal or conjunctival inflammation noted. EOMI. Perrla. Funduscopic exam benign, without hemorrhages, exudates or papilledema. Vision grossly normal. Ears:  External ear exam shows no significant lesions or deformities.  Otoscopic examination reveals clear canals, tympanic membranes are intact bilaterally without bulging, retraction, inflammation or discharge. Hearing is grossly normal bilaterally. Nose:  External nasal examination shows no deformity or inflammation. Nasal mucosa are pink and moist without lesions or exudates. Mouth:  Oral mucosa and oropharynx without lesions or exudates.  Teeth in good repair. Neck:  No deformities, masses, or tenderness noted. Lungs:  Normal respiratory effort, chest expands symmetrically. Lungs are clear to auscultation, no crackles or wheezes.   Impression & Recommendations:  Problem # 1:  ACUTE SINUSITIS, UNSPECIFIED (ICD-461.9)  His updated medication list for this problem includes:    Levaquin 500 Mg Tabs (Levofloxacin) ..... Once daily  Problem # 2:  BACK PAIN (ICD-724.5)  His updated medication list for this problem  includes:    Advil 200 Mg Tabs (Ibuprofen) .Marland Kitchen... Take  1 three times a day,prn    Tylenol Go Tabs Extra Strength 500 Mg Chew (Acetaminophen)    Norco 10-325 Mg Tabs (Hydrocodone-acetaminophen) .Marland Kitchen... 1 every 6 hrs as needed for pain    Dilaudid 4 Mg Tabs (Hydromorphone hcl) ..... One tab every 4 hours, limit 5 tabs daily  Complete Medication List: 1)  Advil 200 Mg Tabs (Ibuprofen) .... Take 1 three times a day,prn 2)  Alprazolam 1 Mg Tabs (Alprazolam) .... Take 1 tab by mouth at bedtime 3)  Lisinopril 10 Mg Tabs (Lisinopril) .Marland Kitchen.. 1 daily 4)  Tylenol Go Tabs Extra Strength 500 Mg Chew (Acetaminophen) 5)  Norco 10-325 Mg Tabs (Hydrocodone-acetaminophen) .Marland Kitchen.. 1 every 6 hrs as needed for pain 6)  Dilaudid 4 Mg Tabs (Hydromorphone hcl) .... One tab every 4 hours, limit 5 tabs daily 7)  Prednisone 10 Mg Tabs (Prednisone) .... Taper as follows:  4-4-4-3-3-2-2-1-1 8)  Levaquin 500 Mg Tabs (Levofloxacin) .... Once daily  Patient Instructions: 1)  As far as the sinusitis goes, we will add Levaquin. I suggested he try a [employer] as well. As far as the pain situation goes, I told him he needs to speak to Dr. Cato Mulligan about this.  Prescriptions: LEVAQUIN 500 MG TABS (LEVOFLOXACIN) once daily  #10 x 0   Entered and Authorized by:   Nelwyn Salisbury MD   Signed by:   Nelwyn Salisbury MD on 11/11/2009   Method used:   [facility]        [phone] (retail[facility]       [address] [place], [place]  [address]       Ph: [phone] or [phone]       Fax: [phone]   RxID:   [id number]

## 2010-09-16 NOTE — Progress Notes (Signed)
Summary: two questions  Phone Note Call from Patient Call back at Home Phone [phone]   Caller: Patient Call For: [name] Summary of Call: pt is requesting Keimani Laufer to return his call today Initial call taken by: Heron Sabins,  March 07, 2010 12:32 PM  Follow-up for Phone Call        Pt called [facility] and they thought he was going elsewhere for pain management so have shredded all we sent them.  Pt now wants [place] to resend to them or another "pain management for nartotics". use.  Also asking if Dr Cato Mulligan has spoken with Dr Vear Clock to clear up why he failed a drug test with them. Follow-up by: Gladis Riffle, RN,  March 07, 2010 3:25 PM  Additional Follow-up for Phone Call Additional follow up Details #1::        ok to send records dr Vear Clock to call me...already done Additional Follow-up by: [name],  March 08, 2010 5:13 PM    Additional Follow-up for Phone Call Additional follow up Details #2::    records faxed Follow-up by: Kern Reap CMA Duncan Dull),  March 10, 2010 12:43 PM

## 2010-09-16 NOTE — Letter (Signed)
Summary: DMV forms  DMV forms   Imported By: Kassie Mends 08/12/2007 14:01:21  _____________________________________________________________________  External Attachment:    Type:   Image     Comment:   DMV forms

## 2010-09-16 NOTE — Assessment & Plan Note (Signed)
Summary: COUGH // RS   Vital Signs:  Patient profile:   45 year old male Weight:      171 pounds BMI:     26.09 Temp:     97.6 degrees F oral Pulse rate:   78 / minute Pulse rhythm:   regular BP sitting:   116 / 78  (left arm) Cuff size:   regular  Vitals Entered By: Raechel Ache, RN (January 20, 2010 10:32 AM) CC: C/o head congestion and productive cough, saw some blood in sputum yesterday.   CC:  C/o head congestion and productive cough and saw some blood in sputum yesterday.Marland Kitchen  History of Present Illness: developed cough over past 2 weeks cough is generally non productive, no fever or chills had some blood tinged mucous from cough yesteday---("i coughed so hard I bled"). 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  Alcohol Use - yes Narcotic Dependent  Risk Factors: Smoking Status: quit (01/06/2010)  Physical Exam  General:  Well-developed,well-nourished,in no acute distress; alert,appropriate and cooperative throughout examination Head:  normocephalic and atraumatic.   Neck:  No deformities,  masses, or tenderness noted. Chest Wall:  No deformities, masses, tenderness or gynecomastia noted. Lungs:  normal respiratory effort, no intercostal retractions, normal breath sounds, and no dullness.     Impression & Recommendations:  Problem # 1:  COUGH (ICD-786.2) given duration will treat as bronchitis may have some nasal componenet see meds side effects discussed  Complete Medication List: 1)  Advil 200 Mg Tabs (Ibuprofen) .... Take 1 three times a day,prn 2)  Alprazolam 1 Mg Tabs (Alprazolam) .... Take 1 tab by mouth at bedtime 3)  Lisinopril 10 Mg Tabs (Lisinopril) .Marland Kitchen.. 1 daily 4)  Tylenol Go Tabs Extra Strength 500 Mg Chew (Acetaminophen) 5)  Dilaudid 4 Mg Tabs (Hydromorphone hcl) .... One tab every 4 hours, limit 5 tabs daily 6)  Zanaflex 4 Mg Tabs (Tizanidine hcl) .... One every eight hours as directed. 7)  Tessalon 200 Mg Caps (Benzonatate) .... Take 1 tablet by mouth two times a day as needed cough 8)  Fluticasone Propionate 50 Mcg/act Susp (Fluticasone propionate) .... 2 sprays each nostril once daily 9)  Zithromax Z-pak 250 Mg Tabs (Azithromycin) .... Take as directed  Patient Instructions: 1)  . Prescriptions: ZITHROMAX Z-PAK 250 MG TABS (AZITHROMYCIN) take as directed  #1 x 0   Entered by:   Willy Eddy, LPN   Authorized by:   [name]   Signed by:   Willy Eddy, LPN on 36/64/4034   Method used:   Electronically to        [facility]  [phone]* (retail)       [address] [place], [place]  [address]       Ph: [phone] or [phone]  Fax: [phone]   RxID:   [id number] FLUTICASONE PROPIONATE 50 MCG/ACT  SUSP (FLUTICASONE PROPIONATE) 2 sprays each nostril once daily  #1 vial x 3   Entered and Authorized by:   [name]   Signed by:   [name] on 01/20/2010   Method used:   Electronically to        [facility]  [phone]* (retail)       [address] [place], [place]  [address]        Ph: [phone] or [phone]       Fax: [phone]   RxID:   [id number] TESSALON 200 MG CAPS (BENZONATATE) Take 1 tablet by mouth two times a day as needed cough  #20 x 0   Entered and Authorized by:   [name]   Signed by:   [name] on 01/20/2010   Method used:   Electronically to        [facility]  [phone]* (retail)       [address] [place], [place]  [address]       Ph: [phone] or [phone]       Fax: [phone]   RxID:   [id number]

## 2010-09-16 NOTE — Progress Notes (Signed)
Summary: Relafen concerns  Phone Note Call from Patient   Caller: Patient Call For: Liba Hulsey Summary of Call: Pt called to report he saw a Pain Management PA yesterday and he put him on Relafen.  This PA does not know my medical and family hx. Pt read the side effects of Relafen, could cause blood clots, heart attach and or stroke.  Pt spoke with his pharmacist and his concerns were directed to Dr Cato Mulligan.  "Is it OK for me to take Relafen with my family hx"? Initial call taken by: Sid Falcon LPN,  April 26, 2007 2:02 PM  Follow-up for Phone Call        follow instructions per pain clinic Follow-up by: [name],  April 27, 2007 9:27 AM  Additional Follow-up for Phone Call Additional follow up Details #1::        Pt informed.  He voiced his understanding. Additional Follow-up by: Sid Falcon LPN,  April 27, 2007 9:46 AM

## 2010-10-13 ENCOUNTER — Ambulatory Visit: Payer: Self-pay | Admitting: Internal Medicine
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
## 2010-12-30 NOTE — Assessment & Plan Note (Signed)
[facility]                         ELECTROPHYSIOLOGY OFFICE NOTE   NAME:[name] [name], [name] [name]                          MRN:          [id number]  DATE:10/19/2007                            DOB:          [birth year]    [name] [name] is status post slow pathway modification for recurrent SVT that  occurred also with nonsustained atrial fibrillation.  He had a history  of syncope with a prodrome that suggested his prior tachycardia.   He has had no recurrent lightheadedness.   He is feeling much better.   Currently is medications include morphine, Xanax, lisinopril and  Cymbalta.  His Toprol has been discontinued.   EXAMINATION:  His blood pressure today was 129/81 with a pulse of 97.  LUNGS:  Clear.  Heart sounds were regular.   Electrocardiogram dated today demonstrated a sinus rhythm at 96 with  intervals of 0.13/0.09/0.35.  The axis was 70 degrees.   IMPRESSION:  1. Supraventricular tachycardia, status post slow pathway      modification.  2. History of atrial fibrillation.  3. Syncope, questionably related to #1.  4. Severe scoliosis with Harrington rods.   [name] [name] is stable.  He he is applying and has received permission to  drive from the state.  We will be glad to help in that way, in whatever  way we can.     Duke Salvia, MD, [facility]  Electronically Signed    SCK/MedQ  DD: 10/19/2007  DT: 10/20/2007  Job #: [id number]

## 2010-12-30 NOTE — Op Note (Signed)
NAMEKAIAN, FAHS NO.:  [id number]   MEDICAL RECORD NO.:  [id number]          PATIENT TYPE:  OIB   LOCATION:  2550                         FACILITY:  [facility]   PHYSICIAN:  Duke Salvia, MD, FACCDATE OF BIRTH:  1966/05/25   DATE OF PROCEDURE:  09/16/2007  DATE OF DISCHARGE:                               OPERATIVE REPORT   PREOPERATIVE DIAGNOSIS:  Supraventricular tachycardia and atrial  fibrillation.   POSTOPERATIVE DIAGNOSIS:  AV nodal reentry with nonsustained atrial  tachycardia.   PROCEDURES:  Invasive electrophysiological study, arrhythmia mapping,  isoproterenol infusion, and RF catheter ablation.   Following obtaining informed consent, the patient was brought to the  electrophysiology laboratory and submitted to general anesthesia under  the care of Dr. Arta Bruce.  After routine prep and drape, cardiac  catheterization was performed with addition of local anesthesia.  Please  see the anesthesia operative report.  Following the procedure, the  catheters were removed, hemostasis was obtained, and the patient was  transferred to the recovery area in stable condition.   CATHETERS:  A 5-French quadripolar catheter was inserted via left  femoral vein to the AV junction.   The 5-French quadripolar catheter was inserted in the left femoral vein  into the right ventricular apex.   A 6-French octapolar catheter was inserted into right femoral vein and  to coronary sinus.   A 7-French 4-mm deflectable tip catheter was inserted via a SL4 sheath  to mapping sites and in the posterior septal space.   Surface leads 1, aVF and V1 were monitored continuously throughout the  procedure.  Following insertion of the catheters, stimulation protocol  included:  Incremental atrial pacing.  Incremental ventricular pacing.  Single and double and triple atrial extra stimuli paced cycle lengths of  300, 350, 400 milliseconds.  Single and double atrial extra stimuli  at paced cycle length of 400  milliseconds.  Single atrial extra stimuli paced cycle length of 600 and 400  milliseconds.  These occurred in the absence and the presence of  isoproterenol.   RESULTS:  Surface electrocardiogram.   Initial:  Rhythm:  Sinus; RR interval:  890 milliseconds; PR interval:  142  milliseconds; QRS duration:  96 milliseconds; QT interval 430  milliseconds; P-wave duration 110 milliseconds; bundle branch block:  Absent; pre-excitation:  Absent.  AH interval:  65 milliseconds; HV interval 44 milliseconds.   Final:  Rhythm:  Sinus; RR interval 705 milliseconds; PR interval 125  milliseconds; QRS duration:  103 milliseconds; QT interval 352  milliseconds; P-wave duration 110 milliseconds; bundle branch block:  Absent; pre-excitation:  Absent.  AH interval 77 milliseconds; and HV interval 58 milliseconds.   AV nodal function:  AV Wenckebach was 280 milliseconds.  VA Wenckebach was 350 milliseconds.  AV nodal refractory period at a paced cycle length of 400 milliseconds  in the absence of isoproterenol was 280 milliseconds.  AV nodal  conduction was continuous with inducible AV nodal reentry tachycardia.  Post procedure, there was a single echocardiogram beat.   Accessory pathway function:  No evidence of an accessory pathway  was  identified.   Ventricular response to programmed stimulation was normal for  ventricular stimulation as described previously.   Arrhythmias induced:  Slow - fast AV nodal reentry tachycardia was  reproducibly induced with complex atrial stimulation typically requiring  double atrial extra stimuli at paced cycle length of 400:  250:  250-230  being the most reproducible.  This occurred in the setting of  isoproterenol.  It could be terminated with atrial overdrive pacing.  Tachycardia cycle length was typically in the range about 290  milliseconds.   In a typical episode of tachycardia, the AV interval was 0; the AH  interval  was 250 milliseconds; the HA interval was 39 milliseconds.   Tachycardia induction was dependent upon AH prolongation.   Radiofrequency ablation:  A total of 88 seconds of RF energy was applied  in approximately 3 applications in the area of the posterior septal  space.  Junctional rhythm ensued after the initial burn and persisted  for prolonged period.  However, tachycardia was still inducible.  We  applied two more burns to where we were able to see fractionated  electrograms during atrial pacing, and thereafter, tachycardia was no  longer inducible notwithstanding increasing doses of isoproterenol; as  noted previously, single echocardiogram beat was seen.   Fluoroscopy time:  A total of 7 minutes and 18 seconds of fluoroscopy  time was utilized at 75 frames per second.   IMPRESSION:  1. Normal sinus function.  2. Normal atrial function apart from nonsustained atrial tachycardia      as being inducible during RF ablation in the posterior septal      space.  3. Abnormal AV nodal function manifested by inducible slow-fast AV      nodal reentry tachycardia.  Catheter ablation successfully modified      the substrate rendering the tachycardia not inducible; single echo      beats were seen.  4. Normal His-Purkinje system function.  5. No accessory pathway.  6. Normal ventricular response to programmed stimulation.   SUMMARY:  In conclusion, the results of electrophysiological testing  demonstrated slow-fast AV nodal reentry tachycardia as the patient's  underlying mechanism.  This was somewhat surprising but no evidence of  accessory pathway function was identified either in the absence or the  presence of isoproterenol.  Slow pathway modification eliminated  inducibility.   The patient tolerated the procedure without apparent complication under  general anesthesia.  The patient was then transferred to recovery room  as noted previously in stable condition.      Duke Salvia, MD, [facility]  Electronically Signed     SCK/MEDQ  D:  09/16/2007  T:  09/16/2007  Job:  [id number]

## 2011-01-02 NOTE — H&P (Signed)
[facility]  Patient:    [name] White                           MRN: [id number] Adm. Date:  [id number] Attending:  Thyra Breed CC:         Julio Sicks, M.D.   History and Physical  FOLLOWUP EVALUATION  Adalberto comes in for evaluation for his opiate maintenance for his underlying back pain on the basis of neuroforaminal stenosis and scoliosis.  Since his last evaluation, the patient has seen Dr. Gayland Curry at [facility] who has recommended surgery.  He is being seen by Dr. Jordan Likes tomorrow to find out whether Dr. Jordan Likes feels like it would be of much benefit.  The patient presented 20 minutes late to his appointment today.  He complains of pain in his lower back in the lumbar region, left arm and hand, right arm and hand as well as the right thigh.  He notes that prolonged standing increases his discomfort.  He rates his pain at 8/10, but appears more comfortable than in the past.  He continues on the Baclofen 10 mg 1/2 tablet q.8h., methadone 10 mg q.6h. and hydrocodone 5/500 p.r.n. for breakthrough pain.  PHYSICAL EXAMINATION:  VITAL SIGNS:  Blood pressure 142/81, heart rate 81, respiratory rate 18, O2 saturation 97%, pain level 8/10.  BACK:  Unchanged from previously.  NEUROLOGIC:  Deep tendon reflexes were symmetric in the lower extremities.  IMPRESSION: 1. Back pain on the basis of scoliosis and spondylosis with neuroforaminal    stenosis. 2. Other medical problems per primary care physician.  DISPOSITION: 1. Continue on current medications. 2. Follow up with me in four weeks.  He is to let us know when he is running    low on his medications so we can refill that prescription. DD:  03/16/00 TD:  03/16/00 Job: [id number]

## 2011-01-02 NOTE — H&P (Signed)
[facility]  Patient:    [name] White, [name] White                          MRN: [id number] Adm. Date:  [id number] Attending:  Thyra Breed CC:         Julio Sicks, M.D.  Bruce Rexene Edison Swords, M.D. [facility]   History and Physical  Kasey comes in for followup evaluation of his chronic low back pain on the basis of scoliosis and spondylosis status post fusion.  The patient has noted he is doing better with his current regimen of Duragesic patch but unfortunately the 75 mcg is not quite enough.  He did get a course of corticosteroids for his lungs and felt markedly improved with regard to his back discomfort.  His symptoms are little changed from his last visit.  PHYSICAL EXAMINATION  VITAL SIGNS:  Blood pressure 159/100, heart rate 104, respiratory rate 18, O2 saturation 97%, pain level 8/10.  BACK:  His back curvature is unchanged.  EXTREMITIES:  Deep tendon reflexes were symmetric at the knees, absent at the right ankle, 2+ a the left ankle.  CURRENT MEDICATIONS: 1. Baclofen 10 mg q.8h. 2. Desipramine 50 mg q.h.s. 3. Clarinex nasal spray. 4. NyQuil. 5. Duragesic patches.  IMPRESSION: 1. Low back pain on the basis of scoliosis with spondylosis. 2. Other medical problems per Dr. Cato Mulligan.  DISPOSITION: 1. Increase Duragesic to 100 mcg q.3 days.  He is given prescription for 50    mcg five to be applied every three days.  I wrote him two prescriptions.    Hopefully we can get some coupons.  He has not heard yet from ______    whether he is approved for patient assistance. 2. Reduce Baclofen to one b.i.d. for a week, the one q.d. for a week, then    stop. 3. Follow-up with me in eight weeks.  He will let us know when he needs more    prescriptions.  Hopefully we will be able to get some coupons in the    interim. DD:  12/17/00 TD:  12/19/00 Job: [id number]

## 2011-01-02 NOTE — H&P (Signed)
[facility]  Patient:    [name] White, [name] White                          MRN: [id number] Adm. Date:  [id number] Attending:  Thyra Breed CC:         Valetta Mole. Swords, M.D. [facility]  Julio Sicks, M.D.   History and Physical  FOLLOW-UP EVALUATION  HISTORY OF PRESENT ILLNESS:  Cirilo comes in for follow-up evaluation of his chronic low back pain on the basis of scoliosis, status post multiple surgical interventions with underlying spondylosis.  Since his last evaluation, he is not doing as well.  He is having a lot of pain.  He tried to go back to work 12 days ago, but has been out of work frequently and is having prominent sleep intrusions.  He has a lot of stiffness when he goes to lay down.  He continues on the methadone four times a day, amitriptyline, and Baclofen.  He is not on a nonsteroidal currently.  He does have some constipation from the methadone and some blood on the toilet tissue when he has hard stools.  He has spoken with Julio Sicks, M.D., who feels like he probably is going to need to go on disability.  He plans to see him next week with regard to this.  PHYSICAL EXAMINATION:  Blood pressure 136/83, heart rate 105, respiratory rate 18, O2 saturation 99%, temperature 97.1 degrees.  Pain is 7/10 constantly.  He exhibits tenderness over his lumbar spine in the facet joint regions.  It is made worse by straightening to any extent.  He walks bent forward about 20-30 degrees and continues to have scoliosis of his back. Straight leg raise signs are negative.  Deep tendon reflexes were symmetric. He does have bilateral knee pain with a history of effusions.  IMPRESSION: 1. Low back pain on the basis of thoracolumbar scoliosis and spondylosis. 2. Other medical problems per Bruce H. Swords, M.D.  DISPOSITION: 1. Increase methadone to 10 mg two p.o. q.8h., #180 with no refill. 2. Continue baclofen. 3. Trial of Voltaren 75 mg b.i.d., #60 with two refills.  The  Celebrex was not    helpful in controlling his discomfort.  He is aware of the side effects of    this in detail. 4. Follow up with me in four weeks. DD:  09/06/00 TD:  09/06/00 Job: [id number]

## 2011-01-02 NOTE — Consult Note (Signed)
[facility]  Patient:    White, [name]                          MRN: [id number] Proc. Date: 02/24/00 Adm. Date:  [id number] Attending:  Thyra White CC:         [name] White, M.D.                          Consultation Report  [name] White comes in for follow-up today. I spoke with his wife earlier today, and she is concerned about the amount of medications that he is taking and not getting anywhere.  The patient presents and states that his pain level is 6-10.  He is taking 180 mg of MS-Contin per day and close to 240 mg of MS-IR. I explained to him that at a certain point he probably is over saturating his opiate receptors and probably not getting as much benefit and does not appear to be responding positively to morphine.  I discussed methadone in detail with him today.  He has noted that when he rolls from side-to-side in bed, he does get a sharp discomfort in his lower back.  He is also concerned about some forms that Dr. Jordan White did not want to fill out.  MEDICATIONS:  Currently he is taking MS-IR 30 mg 2 tablets p.o. q 6-4 hours prn, MS-Contin 60 mg t.i.d., and Elavil at night.  He has dry-mouth from this.  PHYSICAL EXAMINATION:  Blood pressure 140/81.  Heart rate 71.  Respiratory rates 16.  O2 saturations 97%.  Pain level is 9-10.  His neuro exam is grossly unchanged.  IMPRESSION:  Low back pain on the basis of scoliosis and spondylosis.  DISPOSITION: 1. Discontinue MS-Contin. 2. methadone 10 mg 1 p.o. q 6 hours #56 with no refill. 3. Reduce MS-IR to 30 mg 1 p.o. q 6 hours prn. 4. Keep Elavil at current dose. 5. Follow-up with me later this week.  I encouraged the patient to go ahead and get his other bottles of opiates out of his house so that his children do not get into these since they are young and could overdose easily.  I advised the patient that we are using the medications on a time contingent basis, and I am not sure how well he is going  to respond at this point since he does not appear to have had a very positive response to date to opiates.   DISCHARGE INSTRUCTIONS: 1. The patient is to resume his previous medications. 2. Activities limited per instruction sheet. 3. Follow up DD:  02/24/00 TD:  02/24/00 Job: [id number]

## 2011-01-02 NOTE — H&P (Signed)
[facility]  Patient:    [name] White, [name] White                          MRN: [id number] Adm. Date:  [id number] Attending:  Thyra Breed CC:         Julio Sicks, M.D.  Justine Null, M.D. [facility]   History and Physical  FOLLOWUP EVALUATION:  [name] White comes in for followup evaluation of his chronic low back pain with underlying scoliosis, status post surgical intervention, and spondylosis.  He continues to have bilateral knee pain, with a history of previous surgical interventions, with intermittent swelling.  He continues to have lower back discomfort.  We did go up on his methadone to four times a day of 10 mg and stopped the OxyIR.  He has noted an appreciable deterioration but he is certainly no better.  He is continuing physical therapy.  Overall, he looks better than he has in the past.  Unfortunately, recently he has developed a swelling under his left nipple and saw Dr. Valetta Mole. Swords today, who has arranged for him to have a mammogram and ultrasound of this.  He has placed him on Augmentin.  The patient continues to have lower back discomfort which is made worse by standing, walking or sitting for more than 15 to 20 minutes and improved by lying down.  CURRENT MEDICATIONS 1. Baclofen 10 mg one q.6-8h. 2. Methadone 10 mg q.6h. 3. Amitriptyline 25 mg four tablets at night. 4. Augmentin, which was recently started.  EXAMINATION  VITAL SIGNS:  Blood pressure 155/81, heart rate is 98, respiratory rate is 20, O2 saturation is 96% and pain level is 4/10.  NEUROLOGIC:  Straight leg raise signs are negative.  BACK: He continues to have curvature of his back but is minimally tender in his lower back to palpation.  EXTREMITIES:  His knees continue to have some mild effusions.  IMPRESSION 1. Low back pain on the basis of thoracolumbar scoliosis and spondylosis,    status post surgical intervention, which seems to be improving gradually. 2. Knee  discomfort with recurrent swelling. 3. Other medical problems per primary care physician.  DISPOSITION 1. Continue on methadone 10 mg 1 p.o. q.6h., #120. 2. Discontinue Elavil and go with desipramine 50 mg two p.o. q.p.m. 3. Baclofen 10 mg 1 p.o. q.6-8h., #100 with two refills. 4. He was given samples of Celebrex 200 mg one p.o. q.d.; he states he is not    allergic to this. 5. Follow up with me in four weeks. 6. The patient was encouraged to continue with reconditioning of his back. DD:  08/04/00 TD:  08/05/00 Job: [id number]

## 2011-01-02 NOTE — H&P (Signed)
[facility]  Patient:    [name] White, [name] White                          MRN: [id number] Adm. Date:  [id number] Attending:  Thyra Breed CC:         Julio Sicks, M.D.  Bruce Rexene Edison Swords, M.D. [facility]   History and Physical  FOLLOWUP EVALUATION:  The patient comes in for followup evaluation of his chronic low back pain on the basis of thoracolumbar spondylosis with underlying scoliosis.  Since his last evaluation, he does not feel his medicines work at all.  He has preputial, constant discomfort in the back with minimal radiation out into the lower extremities.  He cannot sleep well at night.  He takes the desipramine and within an hour and a half, is awake.  He does feel as though his mouth has been dried out since starting on the Bextra and does not want to continue on this.  He actually is asking about going off all medication as he does not feel it is working.  I advised him we could do this, though he probably would be in a lot of discomfort.  He continues in the process of applying for disability.  PHYSICAL EXAMINATION:  VITAL SIGNS:  Blood pressure is 169/88, heart rate is 97, respiratory rate is 20, O2 saturation is 100% and pain level is 8/10.  HEENT:  His oropharynx demonstrated a red tongue with dryness.  BACK:  His back is unchanged from previously.  NEUROLOGIC:  Deep tendon reflexes were symmetric in the lower extremities with negative straight leg raise signs.  IMPRESSION: 1. Chronic back pain on the basis of thoracolumbar scoliosis and spondylosis. 2. Dry mouth exacerbated by Bextra. 3. Other medical problems per Dr. Valetta Mole. Swords.  DISPOSITION: 1. Stop methadone. 2. Stop Bextra. 3. Trial of Duragesic 50 mcg applied every three days. 4. He is to let us know in two weeks if this is helping him. 5. He was given information to review on intraspinal opiates.  I gave him a    movie to review. 6. Follow up with me in four weeks. 7. Continue with  the Baclofen and the desipramine at current doses. DD:  11/04/00 TD:  11/05/00 Job: [id number]

## 2011-01-02 NOTE — H&P (Signed)
[facility]  Patient:    [name] White, [name] White                       MRN: [id number] Adm. Date:  [id number] Attending:  Thyra Breed CC:         Julio Sicks, M.D.   History and Physical  HISTORY OF PRESENT ILLNESS:  Jessee comes in telling me that five weeks ago he was brushing his teeth and felt something give in his back.  Since then, he has had a severe headache and back discomfort.  He was seen by Dr. 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  The patient has been in a lot of discomfort.  We have given him butalbital, in addition to his Duragesic.  He continues to have headaches.  He is not drinking enough fluid.  He is drinking less than a liter per day.  He complains of left-sided headache but no fever.  He complains of mid low-back pain.  PHYSICAL EXAMINATION:  VITAL SIGNS:  Blood pressure 161/92, heart rate 101, respiratory rate 18.  O2 saturation 96%.  Pain level is 9/10.  NEUROLOGIC:  He has rather marked kyphoscoliosis over the thoracolumbar spine. His deep tendon reflexes were symmetric at the knees, absent at the right ankle, 2+ at the left ankle.  IMPRESSION: 1. Postural puncture headache versus headache of other etiology. 2. Chronic back pain disorder, status post multiple surgeries with spinal    stenosis. 3. Other medical problems per Dr. Birdie Sons.  DISPOSITION: 1. Continue on Duragesic but increase frequency to 100 mcg every two days. 2. Percocet 5/325 one to two p.o. q.6h. p.r.n. breakthrough pain #60 with no    refill. 3. The patient is to drink two to three 2 liter bottles of soda per day for at    least 10 days to see whether his headache will go away.  I advised him that    doing an epidural blood patch would be  technically difficult and may be    fraught with more discomfort than he is already suffering from. 4. Follow up with me in two weeks. DD:  03/14/01 TD:  03/14/01 Job: [id number]

## 2011-01-02 NOTE — H&P (Signed)
[facility]  Patient:    [name] White, [name] White                          MRN: [id number] Adm. Date:  [id number] Attending:  Thyra Breed CC:         Julio Sicks, M.D.  Bruce Rexene Edison Swords, M.D. [facility]   History and Physical  FOLLOWUP EVALUATION:  The patient comes in for followup evaluation of his lumbar thoracic spondylosis with underlying scoliosis.  Since his previous evaluation, he has noted a lot of ongoing problems with stiffness.  He just is not very convinced that his current medical regimen is holding him very well. He recently took a fall off the back of a porch last night and has some contusions over his back and increased discomfort but does not feel any of his rods were necessarily affected by the fall.  He stated that nothing seems to be helping him.  Standing, walking, sitting or lying down increases his discomfort.  He is in the process of applying for disability and I do feel as though that he seems pretty clearly disabled by his pain.  CURRENT MEDICATIONS 1. Tolectin 400 mg q.8h. 2. Baclofen 10 mg q.8h. 3. Methadone 10 mg two q.8h. 4. Desipramine 50 mg two at night.  PHYSICAL EXAMINATION  VITAL SIGNS:  Blood pressure 130/83, heart rate is 101, respiratory rate is 18, O2 saturation is 98% and pain level is 8/10.  BACK:  He exhibits contusions over his back from his fall and scrapes with healed abrasions and scabs.  He has pretty limited forward bending and ongoing scoliosis of his back.  Straight leg raise signs were negative.  IMPRESSION 1. Low back pain on the basis of thoracolumbar spondylosis and scoliosis. 2. Other medical problems per Dr. Valetta Mole. Swords.  DISPOSITION 1. Continue on methadone 10 mg 2 p.o. q.8h., #180 with no refill. 2. Continue Baclofen at current dose. 3. Stop Tolectin and place on Bextra 10 mg one p.o. q.d.  He was advised of    the potential risks of this.  He was given samples of 30 tablets. 4. Increase desipramine  to 50 mg 3 p.o. q.p.m., #100 with 2 refills. 5. Follow up with me in four weeks. DD:  10/07/00 TD:  10/08/00 Job: [id number]

## 2011-01-02 NOTE — Consult Note (Signed)
[facility]  Patient:    [name] White, [name] White                          MRN: [id number] Proc. Date: 06/04/00 Adm. Date:  [id number] Attending:  Thyra Breed CC:         Justine Null, M.D. [facility]  [name] White, M.D.   Consultation Report  FOLLOWUP EVALUATION:  [name] White comes in for followup evaluation.  Since his last evaluation, I converted him over to methadone 10 mg three times a day, continued his Elavil at 25 mg in the evening and encouraged him to consider shifting from the Diazepam to the Baclofen, which he has done over the past four days.  He has continued to require about seven tablets per day of OxyIR. He noted when he started back on the Baclofen, he did have less problems with spasms in his lower back.  He continues to have knife-like sensations, but these are much less frequent, predominantly on the right lower back region. Most of his pain is more muscle spasm-type of discomfort.  He rates his worst pain levels at 8 to 10, but currently is about 4/10.  EXAMINATION  VITAL SIGNS:  Blood pressure 140/87.  Heart rate is 92.  Respiratory rate is 16.  O2 saturation is 96%.  Pain level is 4/10.  BACK:  The patient continues to have scoliosis, but much less than previously. He has well-healed surgical scars.  NEUROLOGIC:  His spirits are good.  His deep tendon reflexes were symmetric in the lower extremities.  Straight leg raise signs are negative.  IMPRESSION 1. Back pain on the basis of underlying scoliosis with spondylosis, status    post surgical intervention in the lumbar spine region, with superimposed    deconditioning. 2. Other medical problems per primary care physician.  DISPOSITION 1. Continue on the methadone at ______ , one p.o. q.8h. 2. Increase the Baclofen to 10 mg q.8h., #100 with 2 refills. 3. Continue with Elavil 25 mg at night. 4. OxyIR 5 mg one to two p.o. q.6h.  I advised the patient that I wanted him    to reduce this down  to just four per day, if not less.  I advised him that    he would likely have some discomfort but I am concerned that we need to    start shifting to the longer-acting opiates rather than the short-acting. 5. I encouraged the patient to continue to walk but walk to the point where he    is starting to have discomfort, then stop, and do this frequently during    the day to try and recondition himself. 6. The patient voiced an interest in trying to return to work in the very near    future and he plans to see Dr. Julio White in a couple of weeks.  I advised    him that I wanted to see him back in two weeks at which time, if he was    doing better, we would probably work on adjusting the short-acting opiates    versus the long-acting opiates.  He may benefit from going up to a mild    extent on the amitriptyline at night, although he does have quite a    dry mouth from this.  He denied any constipation from the opiates that he    cannot manage.  Overall, I feel like he is improved and encouraged by his  progress. DD:  06/04/00 TD:  06/04/00 Job: [id number]

## 2011-01-02 NOTE — Discharge Summary (Signed)
NAMECREEDENCE, KUNESH NO.:  [id number]   MEDICAL RECORD NO.:  [id number]          PATIENT TYPE:  INP   LOCATION:  5703                         FACILITY:  [facility]   PHYSICIAN:  Stacie Glaze, M.D. LHCDATE OF BIRTH:  08/24/1965   DATE OF ADMISSION:  11/18/2005  DATE OF DISCHARGE:  11/20/2005                                 DISCHARGE SUMMARY   ADMISSION DIAGNOSES:  1.  Probably acute withdrawal syndrome.  2.  Chronic low back pain.  3.  History of dyslipidemia.  4.  Depression.   HOSPITAL COURSE:  The patient is a 45 year old white male with a long  history of chronic back pain and chronic pain control with narcotic  prescription pharmaceuticals who decided approximately 10 days prior to  admission that he would discontinue his Duragesic patch due to some of the  side effects that he was having.  At that time, he was using two 100 mg  patches every 48 hours, and then self switched to morphine sulfate 30 mg as  needed for the pain.  He ran out of those medications 2-3 days prior to  admission, was somewhat embarrassed to contact his pain control management,  Dr. Vear Clock, because of his changing his medications, and presented to the  emergency room with dyspnea, chest pain, weakness and shaking.  He was  evaluated with enzymes, electrocardiogram which was normal, and he was found  to be positive for benzodiazepines and morphine in his blood.  He was  treated with oral and IV medications, was anxious, tachycardic, and was  admitted to the floor to a telemetry bed to resume Duragesic and resume  appropriate pain management, as well as to watch for further signs of  narcotic withdrawal.   During his hospitalization, he had progressed rapidly back to baseline.  He  was alert and appropriate and requested at the time of discharge to be  started on an anti-depressant because he realized that depression as playing  a role in his noncompliance.  We will begin Cymbalta 30  mg with a rapid  titration to 60 mg p.o. daily for this patient.  He will follow up with Dr.  Vear Clock for pain management, and he will follow up with Dr. Birdie Sons  for a physical that he says is scheduled at the end of April.   CURRENT MEDICATIONS AT THE TIME OF DISCHARGE:  1.  Lopid 600 mg p.o. b.i.d.  2.  Duragesic 100 mcg patches, apply q.72h.  3.  Robaxin 500 mg b.i.d.  4.  Xanax 1 mg p.o. t.i.d.           ______________________________  Stacie Glaze, M.D. [facility]     JEJ/MEDQ  D:  11/20/2005  T:  11/20/2005  Job:  [id number]

## 2011-01-02 NOTE — Assessment & Plan Note (Signed)
[facility]                                 ON-CALL NOTE   NAME:Eskenazi, ABRAHAM ENTWISTLE                       MRN:          [id number]  DATE:04/26/2007                            DOB:          [birth year]    PHONE NUMBER:  [phone].   Patient of Dr. Cato Mulligan.   Phone call came at 5:27 p.m. on April 26, 2007.   Mr. Mccleery went to see the PA at Dr. Vear Clock', his pain management  doctor, yesterday.  He was given a prescription for Relafen 500 b.i.d.  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  Also a history of arrhythmia  for which he has seen Dr. Graciela Husbands and been on Cardizem in the past.  He  was afraid to take the Relafen because of the warnings about the risk of  blood clots and heart trouble on the Relafen.   PLAN:  I told him that while that risk exists, it is very small, that it  would probably be reasonable for him to go ahead and start the medicine.  I did tell him it would not be a bad idea to call Dr. Vear Clock just to  confirm with him and I told him I would let Dr. Cato Mulligan know about this  recommendation.     Karie Schwalbe, MD  Electronically Signed    RIL/MedQ  DD: 04/26/2007  DT: 04/27/2007  Job #: [id number]   cc:   Valetta Mole. Swords, MD

## 2011-01-02 NOTE — H&P (Signed)
[facility]  Patient:    [name] White, [name] White                          MRN: [id number] Adm. Date:  [id number] Attending:  Thyra Breed CC:         Valetta Mole. Swords, M.D. [facility]   History and Physical  FOLLOWUP EVALUATION:  [name] White comes in today early at his wifes request.  His wife states that he is having difficulties with controlling his temper, which she felt was since being placed on the Duragesic.  The patient presents today and tells me that he has had long-standing difficulties with coping with his pain and that this is not something new since being placed on Duragesic.  He tells me that his wife tries him that he should not be hurting and should not be taking the medications, which frustrates him.  He is frustrated because he is no working and is on disability, which shortens his fuse, and he is dealing with his children who are quite young and being pushed to the limit.  He has no emotional support from his primary family outside of his wife, as he is estranged from his parents.  He has had ongoing problems with his allergies and continues to take NyQuil regularly and I advised him that it was probably not a wise thing to do.  He notes with the Duragesic he occasionally gets nausea and sweats.  He is worried about poor energy and the level of stress he is in.  PHYSICAL EXAMINATION:  VITAL SIGNS:  Blood pressure 157/97.  Heart rate is 96.  Respiratory rate is 22.  O2 saturation is 96%.  Pain level is 7/10.  BACK:  His back shows no change in curvature.  NEUROLOGIC:  Deep tendon reflexes were symmetric at the knees, absent at the right ankle, 2+ at the left ankle.  IMPRESSION: 1. Low back pain with underlying lumbar scoliosis and spondylosis. 2. Other medical problems per Dr. Valetta Mole. Swords.  DISPOSITION: 1. Continue on Duragesic at current dose.  He does not need a prescription for    this. 2. Stop NyQuil. 3. I advised him that he needs to seek  marriage counseling for his marriage    and at the same time, I recommended that he see Dr. Jerrye Beavers, the    psychologist that helps with our pain patients.  I advised him that he    seems to be expressing the stages of bereavement related to his underlying    limitations and he needs some help with pain-coping skills, which he is    fully aware of. 4. I plan to see him back in followup in four weeks. DD:  01/20/01 TD:  01/20/01 Job: [id number]

## 2011-01-02 NOTE — H&P (Signed)
[facility]  Patient:    [name] White, [name] White                          MRN: [id number] Adm. Date:  [id number] Disc. Date: [id number] Attending:  Thyra Breed CC:         Julio Sicks, M.D.  Justine Null, M.D. [facility]   History and Physical  FOLLOW-UP EVALUATION:  [name] White comes in for follow-up evaluation of his back pain on the basis of underlying scoliosis, status post surgical intervention on April 13, 2000, by Julio Sicks, M.D.  Since the surgery, he has noted this pain is modestly improved.  He is still recovering somewhat and is not in a formal physical therapy or defined reconditioning program as of yet.  He has had a lot of difficulties getting his pain under control.  He has gone back and forth between [place] and Norco.  Apparently the OxyIR three tablets of 5 mg six times a day was effective in controlling his pain but left him with confusion and somnolence.  As a result, he has gone off this and he is now on Norco 5/325 up to eight tablets per day, which he states very mildly helps. He has also been placed on Valium.  Last night he started back on amitriptyline after discussing this with me.  He describes his pain as a stabbing discomfort, localized to his lumbar region.  It is exacerbated by standing and walking, improved by lying down.  MEDICATIONS:  His current medications are Valium 5 mg one to two p.o. q.6h., Norco 5/325 one to two p.o. q.6h., and amitriptyline 25 mg at night.  PHYSICAL EXAMINATION:  VITAL SIGNS:  Blood pressure 133/72, heart rate 86, respiratory rate 16, O2 saturation 94%, pain level is 9 out of 10.  The patient is targeting a level of 3-4 out of 10 as a tolerable level.  MUSCULOSKELETAL/NEUROLOGIC:  Exam shows ongoing kyphoscoliosis of the thoracic spine.  Deep tendon reflexes were symmetric in the lower extremities, with negative straight leg raise signs.  He is able to ambulate without difficulty.  IMPRESSION: 1. Back  pain on the basis of his underlying scoliosis and spondylosis, status    post surgical intervention on April 13, 2000. 2. Deconditioning. 3. Other medical problems per primary care physician.  DISPOSITION: 1. The patient presented with all the medicines he had from home.  He had old    MS-Contin, MSIR, and his hydrocodone.  I went ahead and had the nurse and    the patient together flush these down the toilet.  He is left with    amitriptyline, Valium, Vioxx bottles which he is not taking currently, and    Baclofen which he is not taking.  I wrote prescriptions for methadone 10 mg    one p.o. q.8h., #90 with no refill, and OxyIR 5 mg to be taken one tablet    and 30 minutes later if he is still hurting, a second tablet to be spaced    every four to six hours. 2. He is to call me in 10 days to let me know how he is doing on this regimen.    He is to continue on the amitriptyline at night to try and help with any    neuropathic pain and to help with his sleep patterns. 3. He was advised to reduce his Valium to just 5 mg q.6h. p.r.n. 4. I will see the patient back  in follow-up in three weeks. 5. The patient was advised that he somehow needs to begin to condition    himself, or he will have a great deal of difficulty in tolerating return to    work at this point.  I reviewed the issues with the patient, and he    understands why we are trying to simplify everything. DD:  05/21/00 TD:  05/21/00 Job: [id number]

## 2011-01-02 NOTE — Consult Note (Signed)
[facility]  Patient:    [name] White, [name] White                          MRN: [id number] Proc. Date: 02/20/00 Adm. Date:  [id number] Attending:  Thyra Breed CC:         Julio Sicks, M.D.                          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 Since his last visit, we have placed him on MS Contin 30 mg twice a day with MSIR 15 mg and he is taking approximately 12-15 of the MSIR per day. We also placed him on Vioxx and he has developed a sore throat and swelling of his hands. He is taking some amitriptyline at night. He has had a dental extraction yesterday and notes that he is recovering from this.  His back pain continues to be fairly pronounced and he rates it at 9/10.  PHYSICAL EXAMINATION:  VITAL SIGNS:  Blood pressure is 151/91, heart rate is 91, respiratory rate 12, O2 saturations 95% and pain level is 9/10 and temperature is 97.6.  NEUROLOGIC:  Grossly unchanged.  The patient has noted no increased sedation from his current medical management.  The patients noting no constipation.  IMPRESSION:  Low back pain which is multifactorial on the basis of scoliosis, and spondylosis with some lumbar spinal stenosis.  DISPOSITION: 1. Increase MS Contin to 60 mg 1 p.o. b.i.d. x 3-4 days and then t.i.d. if    his pain is not controlled #30 with no refill. 2. MSIR 30 mg 1 p.o. 4-6h p.r.n. pain #100 with no refill. 3. Discontinue Vioxx. 4. Discontinue Elavil. 5. Followup with me in 1 week to try and adjust his opiates to meet his pain    requirements. DD:  02/20/00 TD:  02/23/00 Job: [id number]

## 2011-01-02 NOTE — H&P (Signed)
NAMENOHA, KARASIK NO.:  [id number]   MEDICAL RECORD NO.:  [id number]           PATIENT TYPE:   LOCATION:                                 FACILITY:   PHYSICIAN:  Gordy Savers, M.D. [facility] OF BIRTH:   DATE OF ADMISSION:  11/18/2005  DATE OF DISCHARGE:                                HISTORY & PHYSICAL   CHIEF COMPLAINT:  Chest pain, weakness, and shaking.   HISTORY OF PRESENT ILLNESS:  The patient is a 45 year old gentleman with a  long history of chronic back pain and chronic narcotic use.  Approximately  10 days ago he self discontinued Duragesic patch.  At that time he was using  two patches every 48 hours.  Since that time he has been using p.r.n.  morphine sulfate 30 mg, but had not been taking this for the past two or  three days.  He presented to the emergency department complaining of  increasing dyspepsia, chest pain, weakness, and shaking.  In the emergency  setting evaluation included an electrocardiogram that was normal as well as  screening laboratory data.  This was positive for benzodiazepines and  narcotics.  He was treated with oral and IV medications, but continues to  have tachycardia and anxiety.  He is now admitted for further evaluation and  treatment of an acute narcotic withdrawal syndrome.   PAST MEDICAL HISTORY:  1.  The patient has a history of scoliosis and underwent major surgery at      age 4.  He has been followed by Dr. Jordan Likes for his chronic pain.  In      between 1999 and 2002 has had three additional back surgeries.  2.  He is also followed for chronic pain management by Dr. Vear Clock.  3.  Additionally, he has a history of PSVT and has seen Dr. Graciela Husbands in the      past.  4.  Recently, he has been placed on medications for dyslipidemia.  5.  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  6.  In 1999 he underwent a left total knee replacement surgery by Dr.  Thurston Hole.  7.  He has had a history of a childhood transfusion for anemia, details      unclear.   CURRENT MEDICATIONS:  1.  Duragesic patch two 100 strength every 48 hours.  2.  Morphine sulfate 30 mg every six hours p.r.n. breakthrough pain.  3.  Robaxin 500 mg b.i.d.  4.  Amitriptyline 50 mg at bedtime.  5.  Alprazolam 1 mg p.r.n.   SOCIAL HISTORY:  He is married with two daughters.  He is a nonsmoker,  nondrinker.  He has formerly been employed at [employer], but  presently disabled due to his back pain.   FAMILY HISTORY:  Strongly positive for coronary artery disease.  Father age  72, has had a number of interventions with stenting.  Mother age 42 with  dyslipidemia.  He has a number of half brothers and half sisters.  PHYSICAL EXAMINATION:  GENERAL:  Well-developed male who is quite anxious  and tremulous.  VITAL SIGNS:  Unremarkable except for resting tachycardia of 115.  SKIN:  Warm, slightly diaphoretic.  HEENT:  No trauma.  Pupil responses normal.  ENT negative.  Scattered  dentition was missing.  NECK:  No bruits or adenopathy.  CHEST:  Clear.  CARDIOVASCULAR:  Unremarkable except for tachycardia.  ABDOMEN:  Mildly obese, soft, nontender.  No organomegaly.  No tenderness.  Bowel sounds active.  EXTREMITIES:  Full peripheral pulses.   IMPRESSION:  1.  Narcotic withdrawal syndrome.  2.  Chronic low back pain.  3.  Dyslipidemia.   DISPOSITION:  The patient will be admitted to the hospital.  He will be  placed on Duragesic patch 100 and also be treated with p.r.n. alprazolam.  Will consider a slow outpatient taper of the Duragesic.           ______________________________  Gordy Savers, M.D. [facility]     PFK/MEDQ  D:  11/18/2005  T:  11/18/2005  Job:  [id number]

## 2011-01-02 NOTE — H&P (Signed)
[facility]  Patient:    [name] White, [name] White                          MRN: [id number] Adm. Date:  [id number] Attending:  Thyra Breed CC:         Julio Sicks, M.D.  Justine Null, M.D. [facility]   History and Physical  This is a followup evaluation.  HISTORY OF PRESENT ILLNESS:  Tavarion Babington comes in for followup evaluation of his chronic low back pain on the basis of scoliosis and spondylosis.  Since his previous surgical intervention he is slowly healing.  He did try to go back to work a few weeks back, but noted he was intolerant to this, and is back at home recuperating from his surgery.  He continues to require baclofen 10 mg q.8h., Elavil 25 mg q.h.s., although he is not sleeping real well, and Oxy IR he is taking up to 5 to 8 tablets per day with his methadone 10 mg q.8h.  He has gotten prescriptions for the Oxy IR and methadone recently and does not need any prescription for the baclofen today.  He continues to feel tight in his back, and he is having recurrent swelling over his knees bilaterally.  He has not been back to see Dr. Renin who previously has operated on his knees.  He rates his pain today as an 8/10, but is out of character, it is usually down around 5 to 6/10, but for some reason he feels worse today than usual.  PHYSICAL EXAMINATION:  VITAL SIGNS:  Blood pressure 145/91, heart rate 100, respiratory 20, O2 saturations 96%, pain level is 8/10.  NEUROLOGIC:  He exhibits an effusion of his left knee and some snapping of his tensor lata tendon over the outer aspect of his knee.  Deep tendon reflexes were hypoactive and symmetric in the lower extremities.  Straight leg raise signs were negative.  He continues to have inability to straighten out completely without some discomfort.  IMPRESSION: 1. Back pain on the basis of scoliosis with spondylosis, status post surgical    intervention with overall improvement in his back pain. 2. Knee  pain with underlying degenerative changes, status post left knee    replacement. 3. Other medical problems per primary care physician.  DISPOSITION: 1. Continue on methadone, baclofen, and Oxy IR at current dose.  He is given a    prescription for Oxy IR 5 mg one or two p.o. q.6h. p.r.n. #60 with no    refill. 2. Elavil increased to 25 mg one or two p.o. q.p.m. to see if this will help    with his sleep #60 with 2 refills. 3. Follow up with me in four weeks. 4. He was encouraged to try to walk more during the day. DD:  07/02/00 TD:  07/03/00 Job: [id number]

## 2011-01-02 NOTE — Discharge Summary (Signed)
[place]  Patient:    [name] White, [name] White                          MRN: [id number] Adm. Date:  [id number] Disc. 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  OPERATIONS AND TREATMENTS:  Exploration of the L4-5 fusion with revision of hardware to include the L3 level.  Posterior lumbar interbody fusion at L3-4. Posterior lateral fusion at L3-4.  HISTORY OF PRESENT ILLNESS:  [name] White is a 45 year old male who has history of idiopathic thoracolumbar scoliosis, status post Herrington rod placement down to the level of L3.  The patient developed severe back and lower extremity pain approximately three years ago.  These symptoms then progressively worsening.  He underwent a simple diskectomy at L4-5 with transient relief. The patient later progressed to fusion at L4-5 and now has adjacent level disease at L3-4 with stenosis and significant back pain.  The patient has been counseled as to his options.  He has failed all conservative management.  He presents now for fusion at the L3-4 level.  HOSPITAL COURSE:  The patient underwent an uncomplicated revision of his L4-5 fusion and inclusion of the L3-4 fusion.  Postoperatively, he awakened from anesthesia without difficulty.  Neurologically he was intact.  Back pain was considerable but manageable with medications.  He was gradually mobilized.  On postoperative day #3 the patient was tolerating a regular diet.  He was ambulating without difficulty. His wound is clean, dry and intact.  He was ready for discharge to home.  CONDITION AT DISCHARGE:  Improved.  DISCHARGE MEDICATIONS: 1. OxyContin 20 mg b.i.d. 2. Percocet as needed for breakthrough pain. 3. Valium as needed for muscle spasm.  FOLLOW-UP:  He will have follow-up in my office in one  week. DD:  05/19/00 TD:  05/19/00 Job: [id number]

## 2011-01-02 NOTE — Discharge Summary (Signed)
NAMECHRISTROPHER, GINTZ NO.:  [id number]   MEDICAL RECORD NO.:  [id number]          PATIENT TYPE:  INP   LOCATION:  5703                         FACILITY:  [facility]   PHYSICIAN:  Stacie Glaze, M.D. LHCDATE OF BIRTH:  Dec 23, 1965   DATE OF ADMISSION:  11/17/2005  DATE OF DISCHARGE:  11/20/2005                                 DISCHARGE SUMMARY   ADMISSION DIAGNOSES:  1.  Narcotic withdrawal.  2.  Chronic pain syndrome.   DISCHARGE DIAGNOSES:  1.  Acute narcotic withdrawal, resolved.  2.  Chronic pain syndrome.  3.  Anxiety with depression.   HISTORY OF PRESENT ILLNESS:  The patient is a 45 year old gentleman with a  long history of chronic back pain and chronic narcotic abuse who 10 days  prior to his admission self-discontinued his Duragesic patch and began self-  medicating with morphine sulfate.  He ran out of his medications and was  embarrassed to call his chronic pain management group with Dr. Vear Clock,  because he had changed his medication status.  He presented in the emergency  room in withdrawal with chest pain, weakness, shaking and a rapid pulse.  He  was evaluated in the emergency room, but could not be stabilized and  therefore, was admitted to the medical service for reinitiation of narcotic  therapy and withdrawal management.   MEDICATIONS PRIOR TO ADMISSION:  1.  Duragesic patch, which had been discontinued.  2.  Morphine sulfate.  3.  Robaxin.  4.  Amitriptyline.  5.  Alprazolam.   HOSPITAL COURSE:  During the hospital course, he quickly resolved with his  heart rate returning to normal.  He felt at baseline.  His labs returned to  baseline and he felt he was stable for return to home.  He discussed the  possibility that depression played a role in his pain and requested  initiation of therapy which we began with Cymbalta 30 mg p.o. daily with a  rapid titration to 60 mg p.o. daily.   DISCHARGE MEDICATIONS:  1.  Xanax 1 mg p.o. t.i.d.  2.  MSIR 15 mg q.4-6h. p.r.n. breakthrough pain.  3.  Duragesic patch 100 mcg apply to skin every 72 hours.  4.  Cymbalta 30 mg a day x1 week increasing to 60 mg a day.  5.  Continue muscle relaxants with his Robaxin, gemfibrozil and      amitriptyline for sleep.   FOLLOW UP:  He should follow up with Dr. Cato Mulligan at a scheduled physical in  approximately 1 month and he should contact Dr. Vear Clock office on Monday to  resume his relationship with the pain clinic.   CONDITION ON DISCHARGE:  The patient was deemed stable for discharge.  At  this time, prescriptions were given to the patient.           ______________________________  Stacie Glaze, M.D. [facility]     JEJ/MEDQ  D:  11/20/2005  T:  11/21/2005  Job:  [id number]   cc:   Valetta Mole. Swords, M.D. [facility]  [address] [place]  [place] [address]   [facility]/Dr. Vear Clock

## 2011-01-02 NOTE — Letter (Signed)
October 26, 2007    Medical Hearing Board  Department of Transportation  [place], [place]   RE:  [name], [name]  MRN:  [id number]  /  DOB:  03/27/1966   To Whom It May Concern,   Mr. [name] White underwent electrophysiological testing, arrhythmia mapping  and catheter ablation for inducible supraventricular tachycardia which  in the past has been associated with palpitations and presyncope.   He had an episode of syncope and was his impression that the  palpitations that preceded this were similar to his prior palpitations.  In the event that that is true, the likelihood of recurrence of his  tachycardia and/or palpitations is exceedingly low, typically less than  2.5%.   Please consider this in your review of his driving license  reinstatement.  If there is anything further that I can do, please do  not hesitate to contact me.  His driver license number is [id number].    Sincerely,      Duke Salvia, MD, [facility]  Electronically Signed    SCK/MedQ  DD: 10/25/2007  DT: 10/26/2007  Job #: [id number]

## 2011-01-02 NOTE — Consult Note (Signed)
[facility]  Patient:    [name] White, [name] White                          MRN: [id number] Proc. Date: 02/11/00 Adm. Date:  [id number] Attending:  Thyra Breed CC:         Julio Sicks, M.D.; Cleophas Dunker. Everardo All, M.D.             Alinda Deem, M.D.                          Consultation Report  NEW PATIENT EVALUATION  [name] White is a 45 year old who was sent to [place] by Dr. Jordan Likes for consideration for epidural steroid injections.  The patient presents today and is not interested in injections as he has had these in the past, but more to discuss pain management.  He is scheduled to see Dr. Gayland Curry next month at [facility].  The patient has a rather complex history of back problems, which began in childhood.  He was diagnosed as having scoliosis and apparently had very severe scoliosis which led ultimately to extensive reconstructive surgery of his back, with Harrington rods at the age of 1.  He did well up until 1998 when he felt like somebody had crushed his back in a brick wall.  He was evaluated by Dr. Jordan Likes and found to have herniated disk and underwent a microdiskectomy for his lower back discomfort radiating out to the lateral aspect of the right leg.  Postoperatively, he continued to have a severe deep, dull, aching pain in the lateral aspect of his thigh.  Prior to the surgical intervention, the patient had undergone series of epidurals, initially a caudal which was not helpful and followed by lumbar epidurals which he found to be of minimal benefit.  Postop, as stated, the patient did not note a great deal of improvement.  In September 2000, the patient underwent ray cage placement at L4-5 and postop was doing remarkably well.  He was able to return to work in January and was doing well up until eight weeks ago when he had recurrence of his lower back discomfort.  He awoke one morning with a severe deep ache in the lumbosacral region, with numbness and  tingling out to the right lateral thigh.  He was seen by Dr. Jordan Likes and treated with Vicodin, Norco, and prednisone tapers with no improvement.  A CT scan of his back was performed on Dec 24, 1999, which showed proliferative changes near his fusion, with right-sided facet joints at L2-3, central canal stenosis and significant left foraminal stenosis at 3-4, with bony proliferation.  There is also evidence of a fusion at L4-5.  He is sent for consideration of epidural steroid injections.  The patient states that he is taking up to 10 tablets of Vicodin a day and noting no overall control of his pain.  It does take the edge off and moves it from a level of 9/10 down to 7/10.  His pain is worse by sitting, standing (especially with standing), or with lifting any objects.  It is improved by Vicodin.  He has tried ___________ with no help, over-the-counter topicals with no improvement, Aleve and Nuprin with no improvement.  He complains of persistent numbness and tingling of the lateral aspect of his right thigh for the past four weeks.  He has an altered gait due to his back discomfort and previous problems  with his left knee, so it is difficult if he has got any weakness, according to him.  He does not have any bowel or bladder incontinence.  PAST SURGICAL HISTORY:  Complicated by problems with his left knee.  He initially underwent some arthroscopies complicated by staph infection and in 1999 he had a total knee replacement.  He has noted that he has had some increasing problems with the knee recently.  He has also had a hernia repair in 1996.  CURRENT MEDICATIONS:  Hydrocodone 5/500, Benadryl, and allergy shots.  ALLERGIES:  No known drug allergies.  FAMILY HISTORY:  Positive for prostate cancer, coronary artery disease.  PAST SURGICAL HISTORY:  Significant for back surgeries x 3 as mentioned above, arthroscopies of the knee and ultimately a left total knee replacement, and left inguinal  hernia repair.  SOCIAL HISTORY:  The patient works as a Pensions consultant for [employer], primarily doing computer work now.  He has done Chartered certified accountant work.  He does not smoke, or drink alcohol.  He is married with two children.  ACTIVE MEDICAL PROBLEMS:  Cardiac abnormality which he has tachydysrrhythmias with.  He sees Dr. Graciela Husbands for this, and allergies for pollens and danders for which he sees Dr. [place] Callas.  REVIEW OF SYSTEMS:  General: Negative.  Head: Negative.  Eyes: Negative. Nose, mouth, throat: Negative.  Ears: Negative.  Lungs: Negative. Cardiovascular: See active medical problems.  GI: Negative.  GU: Negative. Musculoskeletal: See HPI.  Neurologic: See HPI.  No history of seizure or stroke.  Cutaneous: Negative.  Hematologic: History of anemia associated with surgeries.  He has had blood transfusions.  Endocrine: Negative.  Psychiatric: Negative.  Allergy: See active medical problems.  PHYSICAL EXAMINATION:  VITAL SIGNS:  Blood pressure 146/85, heart rate 93, respiratory rate 16.  O2 saturation is 98%.  Temperature 97 degrees.  Pain level is 10/10.  He has not taken his hydrocodone this morning.  GENERAL:  This is a pleasant male in no acute distress.  HEENT:  The head is normocephalic, atraumatic.  Eyes:  Extraocular movements intact, with conjunctivae and sclerae clear.  Nose patent.  Ears clear. Oropharynx was free of lesions.  NECK:  Supple without lymphadenopathy.  Carotids are 2+ and symmetrical without bruits.  LUNGS:  Clear.  HEART:  Regular rate and rhythm.  ABDOMEN:  Bowel sounds present.  GENITALIA AND RECTAL:  Exams not performed.  BACK:  Exam revealed extensive well-healed surgical scars over his back with ongoing scoliosis and thoracic chest wall abnormalities secondary to his scoliosis.  He has evidence of a previous left iliac crest bone harvest. Straight leg raise signs are negative.  EXTREMITIES:  The patient demonstrated radial pulses and dorsalis  pedis pulses 2+ and symmetric with no peripheral edema.  He has a well-healed surgical scar  over his left knee with a mild effusion and increased warmth of the left knee relative to the right.  NEUROLOGIC:  The patient was oriented x 4.  Cranial nerves II-XII were grossly intact.  Deep tendon reflexes were symmetric in the upper and lower extremities with downgoing toes.  Motor was 5/5 with symmetric bulk and tone. Sensory was significant for attenuated pinprick over the lateral aspect of the right thigh.  Coordination was grossly intact.  IMPRESSION:  1. Complex back history with history of juvenile scoliosis, with extensive     surgical intervention and subsequent disk herniation with microdiskectomy     followed by ray cage fusion at L4-5 with recurrence of similar symptoms  over the right lower extremity and lower back discomfort with computed     tomography scan showing some spinal stenosis at 3-4 and neural foraminal     stenosis, as well as other degenerative changes.  Suspect a significant     amount of his discomfort is relative to these changes.  2. Allergies per Dr. [place] Callas.  3. Palpitations and fast heart rhythm per Dr. Graciela Husbands.  4. Left knee status post total knee replacement per Dr. Turner Daniels.  DISPOSITION:  1. The patient is not interested in epidural steroid injection today as he     has had a poor response in the past.  He wishes to consider adjusting his     medications to try and get better pain control.  We discussed treatment     options which would include opiates versus anticonvulsants versus     antidepressants versus nonsteroidals, or a combination thereof.  Today, I     advised him I wished to switch him over to morphine.  He has taken this in     the past and had a good response in the form of MS Contin 30 mg 1 p.o.     b.i.d. with a prescription for 60, and MSIR 15 mg 1 p.o. q.6h. p.r.n.     breakthrough pain, #100, with no refill.  In addition, we will start  Vioxx     25 mg 1 p.o. q.d.  He is aware of the side effects of both these     medications and was given added information on this.  2. If he is not responding to this we will consider adding Neurontin versus     low doses of tricyclic antidepressants.  3. Follow up with me in three weeks.  4. The patient had a controlled substance contract reviewed with him by the     nurses and he is aware of the potential side effects in great detail and     knows the need to take medicines to maintain good bowel regularity on this     I advised him we could reconsider the epidurals if we could get his pain     under a little bit better control. DD:  02/11/00 TD:  02/11/00 Job: [id number]

## 2011-01-02 NOTE — Consult Note (Signed)
[facility]  Patient:    [name] White, [name] White                          MRN: [id number] Proc. Date: 02/27/00 Adm. Date:  [id number] Attending:  Thyra Breed CC:         Julio Sicks, M.D.                          Consultation Report  FOLLOWUP EVALUATION:  [name] comes in for followup evaluation of his chronic pain on the basis of scoliosis and history of underlying disk herniations. Since his last evaluation, the patient is continuing to note intermittent numbness and tingling of his hands involving the second, third and fourth digits, left greater than the right.  It is more pronounced in the mornings than later in the day.  He feels as though they are waking up from sleep.  He has also had some numbness out into his right lower extremity to a greater extent than the left.  His back pain has really not changed a great deal with the methadone but he is not deteriorating.  He notes that his urination has improved.  He developed vivid dreams and dreamed that he was catching ice from unloading a truck and feels as though he strained his back in his dreams.  CURRENT MEDICATIONS: 1. Amitriptyline 25 mg in the evening. 2. MSIR 30 mg q.4-6h. hours. 3. Methadone 10 mg q.6h.  EXAMINATION:  VITAL SIGNS:  Blood pressure is 129/74, heart rate is 94, respiratory rate is 16, O2 saturation is 95%, pain level is 8/10 and temperature is 97 degrees.  MUSCULOSKELETAL:  Patient demonstrated positive Tinel signs at the wrist. Deep tendon reflexes in the upper extremities were symmetric.  Lower extremities demonstrated deep tendon reflexes to be symmetric.  His back shows rather significant scoliosis.  He has some tenderness, especially on the right side of his back in the paraspinous muscles.  IMPRESSION: 1. Back pain on the basis of scoliosis and underlying degenerative changes    with neural foraminal stenosis and a significant component of myofascial    pain. 2. Other  medical problems per primary care physician.  DISPOSITION: 1. Continue methadone at current dose. 2. MSIR 30 mg one p.o. q.4-6h., #100 with no refill. 3. Stop amitriptyline. 4. Baclofen 10 mg one-half tablet q.8h.  The patient was made fully aware of    the side-effects of this medication and his questions answered. 5. Follow up with me in two weeks. 6. The patient is scheduled to see Dr. Joesph Fillers [place] next week at    [facility]. DD:  02/27/00 TD:  02/27/00 Job: [id number]

## 2011-01-02 NOTE — H&P (Signed)
[facility]  Patient:    [name] White, [name] White                       MRN: [id number] Adm. Date:  [id number] Disc. Date: [id number] Attending:  Thyra Breed CC:         Julio Sicks, M.D.  Bruce Rexene Edison Swords, M.D. [facility]   History and Physical  FOLLOW-UP EVALUATION  Maleke comes in for follow-up evaluation of his chronic low back pain syndrome and spinal headache.  Since his last evaluation, his headache has improved by consuming increased quantities of fluids.  His back continues to bother him rather severely.  He is taking the Oxy IR up to eight tablets per day on a regular basis.  He continues to rate his pain at about 9/10.  He is not sleeping well at all.  He is asking about going back on the Elavil that he has been on in the past.  He states that sitting, standing, lying down will make him uncomfortable in just a brief period of time.  He is unable to sit still during the exam.  PHYSICAL EXAMINATION:  VITAL SIGNS:  Blood pressure 126/78, heart rate 74, respiratory rate 16, O2 saturations 95%.  Pain level is 9/10.  NEUROLOGIC:  His neuro exam is grossly unchanged and the curvature of his back is unchanged.  IMPRESSION: 1. Headache, improved. 2. Chronic back pain disorder with scoliosis and underlying spinal stenosis. 3. Other medical problems per Dr. Birdie Sons.  DISPOSITION: 1. Increase Duragesic to 75 mcg two patches every two days, #30. 2. Continue with the Oxycodone for breakthrough pain.  Prescription written    for Oxy IR 5 mg one p.o. q.6h. p.r.n., #50. 3. Resume Elavil 25 mg one p.o. q.p.m., #30, with two refills.  He was advised    to stop it if he developed significant side effects. 4. Follow up with me in four weeks. DD:  03/31/01 TD:  03/31/01 Job: [id number]

## 2011-01-02 NOTE — Assessment & Plan Note (Signed)
[facility]                              CARDIOLOGY OFFICE NOTE   NAME:[name] White, [name] White                       MRN:          [id number]  DATE:06/15/2006                            DOB:          [birth year]    The patient is seen at [facility] on 06/15/2006 per Dr. Diona Browner with  Dr. Dietrich Pates.   This is a 45 year old African-American male patient with a history of a  nonischemic cardiomyopathy, hypertension, and diabetes.  He last saw Dr.  Tenny Craw on 05/06/2006 with uncontrolled hypertension and was to bring his  bottles in today to go over his medicines and repeat blood pressure check.   The patient arrives today and he does not have his medications with him.  He  admits to running out of benazepril several days ago and says he cannot  refill it on his Medicaid card until November 1st.  He claims he is taking  all his other medications as prescribed.  He was in the hospital recently  with a diagnosis of pancreatitis and at that time his blood pressure was  stable at, I believe, 123/70.  He denies any chest pain, shortness of  breath, dizziness, or presyncope.   CURRENT MEDICATIONS:  Current medications that he claims he is taking are  Protonix 40 mg daily, furosemide 40 mg daily, niacin 1 gram daily,  benazepril 40 mg daily, except he ran out several days ago, Kcl 20 mEq  daily, Glucotrol 10 mg b.i.d., Ativan 0.5 mg b.i.d., Aldactone 25 mg daily,  Glucophage 1000 mg b.i.d., lorazepam 0.5 mg daily, Zocor 20 mg daily, Toprol  XL 125 mg b.i.d., and insulin.   PHYSICAL EXAMINATION:  This is a pleasant obese 45 year old African-American  male in no acute distress.  Blood pressure is 141/90, pulse 80, weight  342.12.  Neck is without JVD, HJR, or thyroid enlargement.  Lungs clear  anterior, posterior and lateral. Heart regular rate and rhythm at 80 beats  per minute. Normal S1 and S2.  No S3, murmur, bruits, thrill, or heaves  noted.  Abdomen soft  without organomegaly, masses, lesions, or abnormal  tenderness.  Extremities without cyanosis, clubbing or edema with good  distal pulses.   IMPRESSION:  1. Hypertension.  Did not bring his medications in as asked and not taking      his benazepril for the past couple of days.  2. Nonischemic cardiomyopathy, compensated.  3. Dyslipidemia, needs a fasting lipid panel when he returns.  4. Diabetes mellitus.  5. Recent diagnosis of pancreatitis.  6. Smoking five cigarettes per day.   PLAN:  We have given a new prescription for benazepril that he can fill on  the $4.00 [employer] plan.  Talked to him about the importance of taking his  medications.  I have asked him to bring  all his medicines in, in one week and have it checked by the nurse and he  will follow up with Dr. Tenny Craw in two months.  I have also asked him to quite  smoking.     ______________________________  Jacolyn Reedy, PA-C  ______________________________  Jonelle Sidle, MD   ML/MedQ  DD: 06/15/2006  DT: 06/15/2006  Job #: [id number]

## 2011-01-02 NOTE — Op Note (Signed)
[place]  Patient:    [name] White, [name] White                          MRN: [id number] Proc. Date: 04/13/00 Adm. Date:  [id number] Attending:  Donn Pierini                           Operative Report  SERVICE:  Neurosurgery.  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  OPERATION:  Removal of thoracolumbar instrumentation, L3-4 decompressive laminectomy with bilateral L3 and L4 foraminotomies, L3-4 posterior lumbar interbody fusion utilizing tangent wedges and local autograph, revision of L4-5 instrumentation, L3 through L5 segmental instrumentation, posterior lateral effusion with local autograft L3-4.  SURGEON:  Julio Sicks, M.D.  ASSISTANT:  Reinaldo Meeker, M.D.  ANESTHESIA:  General endotracheal.  INDICATIONS:  [name] is a 45 year old male with a history of multiple spinal surgeries.  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  His fusion extends down to the L3 level.  The patient has a past history of a previous right-sided L4-5 disk herniation with a subsequent recurrence.  He is status post L4-5 decompression infusion with instrumentation.  He now presents with worsening back and right lower extremity pain consistent with an L3 radiculopathy.  Work-up demonstrates severe degenerative changes and stenosis and facet arthropathy at L3-4.  We have decided to proceed with a lumbar decompression and fusion at the L3-4 level.  His previous fusion surgeries will have to be revised to accomplish this however.  DESCRIPTION OF PROCEDURE:  The patient was taken to the operating room and placed on the operating table in the supine position.  After an adequate level of anesthesia was achieved, the patient was turned prone on the Wilson frame and appropriately padded.  The patients lumbar region was shaved and  prepped sterilely.  A 10 blade was used to make linear skin incision extending from L2 down to L5.  This was carried down in the midline.  A subperiosteal dissection was then performed exposing the lamina of L2, L3, the facet joints of L4 and L5.  The transverse processes at L3 were identified and exposed bilaterally. The fusion mass at L4-5 was exposed bilaterally.  A deep  self-retaining retractor was placed.  The laminectomy dissected at L4-5 was dissected free using gentle instrument.  The lamina of L3 was then removed using high-speed drill and Kerrison rongeurs.  All bone was saved for later use in grafting. The entire lamina of L3 was removed.  The Harrington rod was exposed from above.  This was then cut with a cutting bit on the Midas Rex.  The inferior aspect of the rod as well as the sublaminar hook was removed.  The L3 laminectomy was completed.  The ligamentum flavum was then elevator resected in piecemeal fashion using Kerrison rongeur.  The exiting L3 and L4 nerve roots were identified bilaterally and widely decompressed using Kerrison rongeur.  Microscope was brought into the field and used for microdissection of the spinal canal.  The epidural plexus was coagulated and cut.  The disk space on the right side at L3-4 was completely collapsed.  The disk space at L3-4 on the right side was isolated and incised with a 15 blade in a rectangular fashion. A wide disk space cleaned out achieved using upward angled  pituitary rongeurs and Epstein curets.  The disk space was then sequentially distracted to a size of 8 mm.  Attention was then placed back on the patients right side.  At this point, the disk space was now distracted. The remaining aspects of disks on the right side were removed using pituitary rongeurs.  The disk space was then distracted and 9 mm on this side.  The other distractor was removed.  The microscope was removed.  The disk space was then reamed and then cut  with a 8 mm chisel for later placement of tangent wedges.  A 8 x 26 mm tangent wedge was then placed in the left side without complication.  The distractor was removed from the contralateral side. Intraoperative fluoroscopy revealed good position of above graft to the proper operative level.  Attention was then placed to the right side.  The disk space was once again reamed and cut with a 8 mm chisel.  The interspace was then packed with morcellized autograft.  A second 8 x 26 mm tangent wedge was then impacted into place on the right side. The thecal sac and nerve roots were found to be free from injury or any further compression.  The wound was then copiously irrigated with antibiotic solution.  Pedicles were isolated at L3 bilaterally.  Superficial bone overlying the pedicles was removed using the high-speed drill.  The pedicle was then probed under fluoroscopic guidance using a pedicle awl.  The awl tract was found to the solidly than bones.  Each awl tract was then tapped with a 5.25 mm screw tap.  Once again, this was found to be solidly in bone. A 6.75 x 45 mm spherical-headed screws were then placed bilaterally at the L3 level.  Locking caps were then removed from the previous screw and rod construct at L4-5.  The rods were removed at L4-5 bilaterally.  New rods were then placed from L5 incorporating L4 and L3. These were contoured appropriately.  Locking caps were engaged at all three levels bilaterally.  The caps were engaged sequentially to place the construct under compression.  At this point, instrumentation was found to be well placed in both the lateral and AP planes using fluoroscopy.  There is no evidence of any pedicle violation anywhere.  Blunt probes passed easily along the nerve roots.  The transverse processes at L3 and L4 were then decorticated using high-speed drill bilaterally.  Morcellized autograft was packed posterior and laterally.  The wound was irrigated  one final time.  Gelfoam was placed topically for hemostasis which was found to be good.  The wound was then closed in layers with Vicryl sutures.  Staples were applied to the surface.  There were no apparent complications.  The patient  tolerated the procedure well.  We left a medium Hemovac drain in the epidural space. DD:  04/13/00 TD:  04/14/00 Job: [id number]

## 2011-05-25 LAB — DIFFERENTIAL
Basophils Absolute: 0
Basophils Relative: 1
Eosinophils Absolute: 0 — ABNORMAL LOW
Eosinophils Relative: 1
Lymphocytes Relative: 26
Lymphs Abs: 1.1
Monocytes Absolute: 0.3
Monocytes Relative: 7
Neutro Abs: 2.9
Neutrophils Relative %: 66

## 2011-05-25 LAB — BASIC METABOLIC PANEL
BUN: 7
CO2: 28
Calcium: 9.3
Chloride: 101
Creatinine, Ser: 0.86
GFR calc Af Amer: >60
GFR calc non Af Amer: >60
Glucose, Bld: 121 — ABNORMAL HIGH
Potassium: 4.2
Sodium: 134 — ABNORMAL LOW

## 2011-05-25 LAB — CBC
HCT: 44.8
Hemoglobin: 15.3
MCHC: 34.2
MCV: 85.8
Platelets: 238
RBC: 5.22
RDW: 13.4
WBC: 4.3

## 2011-06-19 ENCOUNTER — Ambulatory Visit (INDEPENDENT_AMBULATORY_CARE_PROVIDER_SITE_OTHER): Payer: Medicare Other | Admitting: Internal Medicine

## 2011-06-19 DIAGNOSIS — Z23 Encounter for immunization: Secondary | ICD-10-CM

## 2011-09-02 DIAGNOSIS — M546 Pain in thoracic spine: Secondary | ICD-10-CM | POA: Diagnosis not present

## 2011-09-02 DIAGNOSIS — M5412 Radiculopathy, cervical region: Secondary | ICD-10-CM | POA: Diagnosis not present

## 2011-09-02 DIAGNOSIS — F341 Dysthymic disorder: Secondary | ICD-10-CM | POA: Diagnosis not present

## 2011-09-02 DIAGNOSIS — M542 Cervicalgia: Secondary | ICD-10-CM | POA: Diagnosis not present

## 2011-09-02 DIAGNOSIS — G541 Lumbosacral plexus disorders: Secondary | ICD-10-CM | POA: Diagnosis not present

## 2011-10-07 DIAGNOSIS — M5412 Radiculopathy, cervical region: Secondary | ICD-10-CM | POA: Diagnosis not present

## 2011-10-07 DIAGNOSIS — F341 Dysthymic disorder: Secondary | ICD-10-CM | POA: Diagnosis not present

## 2011-10-07 DIAGNOSIS — M542 Cervicalgia: Secondary | ICD-10-CM | POA: Diagnosis not present

## 2011-10-07 DIAGNOSIS — G541 Lumbosacral plexus disorders: Secondary | ICD-10-CM | POA: Diagnosis not present

## 2011-11-03 DIAGNOSIS — M5412 Radiculopathy, cervical region: Secondary | ICD-10-CM | POA: Diagnosis not present

## 2011-11-03 DIAGNOSIS — Z79899 Other long term (current) drug therapy: Secondary | ICD-10-CM | POA: Diagnosis not present

## 2011-11-03 DIAGNOSIS — G541 Lumbosacral plexus disorders: Secondary | ICD-10-CM | POA: Diagnosis not present

## 2011-11-03 DIAGNOSIS — F341 Dysthymic disorder: Secondary | ICD-10-CM | POA: Diagnosis not present

## 2011-11-03 DIAGNOSIS — M542 Cervicalgia: Secondary | ICD-10-CM | POA: Diagnosis not present

## 2011-12-08 DIAGNOSIS — G541 Lumbosacral plexus disorders: Secondary | ICD-10-CM | POA: Diagnosis not present

## 2011-12-08 DIAGNOSIS — F341 Dysthymic disorder: Secondary | ICD-10-CM | POA: Diagnosis not present

## 2011-12-08 DIAGNOSIS — M542 Cervicalgia: Secondary | ICD-10-CM | POA: Diagnosis not present

## 2011-12-08 DIAGNOSIS — M5412 Radiculopathy, cervical region: Secondary | ICD-10-CM | POA: Diagnosis not present

## 2012-01-12 DIAGNOSIS — G541 Lumbosacral plexus disorders: Secondary | ICD-10-CM | POA: Diagnosis not present

## 2012-01-12 DIAGNOSIS — F341 Dysthymic disorder: Secondary | ICD-10-CM | POA: Diagnosis not present

## 2012-01-12 DIAGNOSIS — M542 Cervicalgia: Secondary | ICD-10-CM | POA: Diagnosis not present

## 2012-01-12 DIAGNOSIS — M5412 Radiculopathy, cervical region: Secondary | ICD-10-CM | POA: Diagnosis not present

## 2012-01-26 ENCOUNTER — Telehealth: Payer: Self-pay | Admitting: Internal Medicine

## 2012-01-26 NOTE — Telephone Encounter (Signed)
Patient called with nosebleed x 90 minutes, he put a tissue in nostril to stop bleeding and nose rebleeds when tissue removed, he has not applied pressure correctly to stop bleeding, he has a sore up in nostril, he now sees pink when nare dabbed with tissue, denies emergent s/s per Nosebleed Guideline, Home care advise given with directions to stop nosebleed correctly,See in 72 hr disposition obtained, caller to make appointment in next 3 days

## 2012-02-25 ENCOUNTER — Encounter: Payer: Self-pay | Admitting: Internal Medicine

## 2012-02-25 ENCOUNTER — Ambulatory Visit (INDEPENDENT_AMBULATORY_CARE_PROVIDER_SITE_OTHER): Payer: Medicare Other | Admitting: Internal Medicine

## 2012-02-25 ENCOUNTER — Telehealth: Payer: Self-pay | Admitting: Internal Medicine

## 2012-02-25 VITALS — BP 180/95 | Temp 98.4°F | Ht 71.0 in | Wt 199.0 lb

## 2012-02-25 DIAGNOSIS — M549 Dorsalgia, unspecified: Secondary | ICD-10-CM

## 2012-02-25 DIAGNOSIS — I10 Essential (primary) hypertension: Secondary | ICD-10-CM

## 2012-02-25 MED ORDER — TIZANIDINE HCL 4 MG PO TABS: 4.0000 mg | ORAL_TABLET | Freq: Three times a day (TID) | ORAL | Status: DC | PRN

## 2012-02-25 MED ORDER — AMLODIPINE BESYLATE 5 MG PO TABS: 5.0000 mg | ORAL_TABLET | Freq: Every day | ORAL | Status: DC

## 2012-02-25 NOTE — Telephone Encounter (Signed)
Pt was in for ov this morning to see Dr Cato Mulligan and pt said that his meds were suppose to be called in to pharmacy. [facility] [address] and nothing has been called in yet or sen in. Pls call in the amLODipine (NORVASC) 5 MG tablet, tiZANidine (ZANAFLEX) 4 MG tablet , ALPRAZolam (XANAX) 1 MG tablet [facility] [address]

## 2012-02-25 NOTE — Telephone Encounter (Addendum)
rx's sent in electronically except for xanax.  Dr Cato Mulligan took it off med list and wants pt to taper off.  But pt is currently out of xanax.

## 2012-02-25 NOTE — Telephone Encounter (Signed)
Xanax 1mg  1/2 tablet bid for 7 days and then 1/2 tablet at night for 7 days and then stop.  #11 tabs, 0 refills

## 2012-02-25 NOTE — Patient Instructions (Addendum)
Taper off xanax  Take 1/2 tablet 2 times daily for one weeks and then 1/2 tablet at night for one week and then stop

## 2012-02-25 NOTE — Telephone Encounter (Signed)
rx called in

## 2012-02-26 ENCOUNTER — Telehealth: Payer: Self-pay | Admitting: Internal Medicine

## 2012-02-26 MED ORDER — AMLODIPINE BESYLATE 5 MG PO TABS: 5.0000 mg | ORAL_TABLET | Freq: Every day | ORAL | Status: DC

## 2012-02-26 NOTE — Telephone Encounter (Signed)
rx sent in electronically 

## 2012-02-26 NOTE — Telephone Encounter (Signed)
Please resend rx of amLODipine (NORVASC) 5 MG tablet   [facility] [address]

## 2012-02-28 NOTE — Assessment & Plan Note (Signed)
He is off narcotics Will taper off benzos  Advised regular exercise

## 2012-02-28 NOTE — Assessment & Plan Note (Signed)
Note controlled Needs to start medications F/u in one week with nurse

## 2012-02-28 NOTE — Progress Notes (Signed)
Patient ID: [name] White, male   DOB: 10/14/1965, 46 y.o.   MRN: [id number] Chronic back pain--- off of narcotics and would like to come off of benzos  Cough-- dry cough for one week-- improving  Past Medical History  Diagnosis Date  . Hypertension   . Chronic pain syndrome   . Blood transfusion without reported diagnosis     1960's  . Nephrolithiasis   . Hyperlipidemia   . AF (atrial fibrillation)     nonsustained  . PSVT (paroxysmal supraventricular tachycardia)   . Cardiomyopathy secondary     History   Social History  . Marital Status: Legally Separated    Spouse Name: N/A    Number of Children: N/A  . Years of Education: N/A   Occupational History  . Not on file.   Social History Main Topics  . Smoking status: Former Games developer  . Smokeless tobacco: Not on file  . Alcohol Use: Yes  . 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 Sexually Active:    Other Topics Concern  . Not on file   Social History Narrative  . No narrative on file    Past Surgical History  Procedure Date  . Back surgery     01/12/00 and 1982  . Replacement total knee     1999  . Av node ablation     09/16/07    Family History  Problem Relation Age of Onset  . Osteoporosis Father   . Rheum arthritis Mother     Not on File  Current Outpatient Prescriptions on File Prior to Visit  Medication Sig Dispense Refill  . acetaminophen (TYLENOL) 500 MG tablet Take 500 mg by mouth as needed.        . diphenhydrAMINE (BENADRYL) 25 MG tablet Take 25 mg by mouth as needed.        Marland Kitchen ibuprofen (ADVIL,MOTRIN) 200 MG tablet Take 200 mg by mouth 3 (three) times daily.        Marland Kitchen amLODipine (NORVASC) 5 MG tablet Take 1 tablet (5 mg total) by mouth daily.  90 tablet  3     patient denies chest pain, shortness of breath, orthopnea. Denies lower extremity edema, abdominal pain, change in appetite, change in bowel movements. Patient denies rashes, musculoskeletal complaints. No other specific complaints in a  complete review of systems.   BP 180/95  Temp 98.4 F (36.9 C) (Oral)  Ht 5\' 11"  (1.803 m)  Wt 199 lb (90.266 kg)  BMI 27.75 kg/m2  well-developed well-nourished male in no acute distress. HEENT exam atraumatic, normocephalic, neck supple without jugular venous distention. Chest clear to auscultation cardiac exam S1-S2 are regular. Abdominal exam overweight with bowel sounds, soft and nontender.

## 2012-03-10 ENCOUNTER — Ambulatory Visit (INDEPENDENT_AMBULATORY_CARE_PROVIDER_SITE_OTHER): Payer: Medicare Other | Admitting: *Deleted

## 2012-03-10 VITALS — BP 166/94

## 2012-03-10 DIAGNOSIS — I10 Essential (primary) hypertension: Secondary | ICD-10-CM | POA: Diagnosis not present

## 2012-03-15 NOTE — Progress Notes (Signed)
Pt comes in for a BP check.  Pt is taking amlodipine 5 mg daily.  Per Dr Cato Mulligan increase amlodipine to 10 mg and recheck in 2 wks

## 2012-03-28 ENCOUNTER — Ambulatory Visit (INDEPENDENT_AMBULATORY_CARE_PROVIDER_SITE_OTHER): Payer: Medicare Other | Admitting: Internal Medicine

## 2012-03-28 VITALS — BP 164/94

## 2012-03-28 DIAGNOSIS — I10 Essential (primary) hypertension: Secondary | ICD-10-CM | POA: Diagnosis not present

## 2012-03-28 DIAGNOSIS — M5430 Sciatica, unspecified side: Secondary | ICD-10-CM | POA: Diagnosis not present

## 2012-03-31 ENCOUNTER — Other Ambulatory Visit: Payer: Self-pay | Admitting: *Deleted

## 2012-03-31 MED ORDER — LISINOPRIL 20 MG PO TABS: 20.0000 mg | ORAL_TABLET | Freq: Every day | ORAL | Status: DC

## 2012-03-31 NOTE — Progress Notes (Signed)
Pt comes in today for a nurse visit BP check, per Dr Cato Mulligan add lisinopril 20 mg daily and f/u in 1 month

## 2012-04-04 ENCOUNTER — Telehealth: Payer: Self-pay | Admitting: Internal Medicine

## 2012-04-04 DIAGNOSIS — I10 Essential (primary) hypertension: Secondary | ICD-10-CM

## 2012-04-04 MED ORDER — AMLODIPINE BESYLATE 5 MG PO TABS: 10.0000 mg | ORAL_TABLET | Freq: Every day | ORAL | Status: DC

## 2012-04-04 NOTE — Telephone Encounter (Signed)
Pt states that at his last visit Dr. Cato Mulligan changed his dose of amLODipine (NORVASC) 5 MG tablet from 1 time daily to 2 times daily. Pt script was not changed at the pharmacy and is now having issues getting it refilled and is requesting a new script to be sent to the pharmacy [facility] [address]

## 2012-04-04 NOTE — Telephone Encounter (Signed)
rx sent in electronically 

## 2012-04-06 DIAGNOSIS — M719 Bursopathy, unspecified: Secondary | ICD-10-CM | POA: Diagnosis not present

## 2012-04-06 DIAGNOSIS — M545 Low back pain, unspecified: Secondary | ICD-10-CM | POA: Diagnosis not present

## 2012-04-06 DIAGNOSIS — M76899 Other specified enthesopathies of unspecified lower limb, excluding foot: Secondary | ICD-10-CM | POA: Diagnosis not present

## 2012-04-06 DIAGNOSIS — M67919 Unspecified disorder of synovium and tendon, unspecified shoulder: Secondary | ICD-10-CM | POA: Diagnosis not present

## 2012-05-27 ENCOUNTER — Ambulatory Visit (INDEPENDENT_AMBULATORY_CARE_PROVIDER_SITE_OTHER): Payer: Medicare Other | Admitting: Internal Medicine

## 2012-05-27 ENCOUNTER — Encounter: Payer: Self-pay | Admitting: Internal Medicine

## 2012-05-27 ENCOUNTER — Ambulatory Visit: Payer: 59 | Admitting: Internal Medicine

## 2012-05-27 VITALS — BP 118/78 | HR 90 | Temp 98.4°F | Ht 71.0 in | Wt 179.0 lb

## 2012-05-27 DIAGNOSIS — E785 Hyperlipidemia, unspecified: Secondary | ICD-10-CM | POA: Diagnosis not present

## 2012-05-27 DIAGNOSIS — R7309 Other abnormal glucose: Secondary | ICD-10-CM

## 2012-05-27 DIAGNOSIS — I10 Essential (primary) hypertension: Secondary | ICD-10-CM

## 2012-05-27 DIAGNOSIS — M549 Dorsalgia, unspecified: Secondary | ICD-10-CM

## 2012-05-27 DIAGNOSIS — R739 Hyperglycemia, unspecified: Secondary | ICD-10-CM

## 2012-05-27 DIAGNOSIS — E119 Type 2 diabetes mellitus without complications: Secondary | ICD-10-CM | POA: Insufficient documentation

## 2012-05-27 LAB — CBC WITH DIFFERENTIAL/PLATELET
Basophils Absolute: 0 10*3/uL (ref 0.0–0.1)
Basophils Relative: 0.5 % (ref 0.0–3.0)
Eosinophils Absolute: 0.1 10*3/uL (ref 0.0–0.7)
Eosinophils Relative: 1.6 % (ref 0.0–5.0)
HCT: 46.3 % (ref 39.0–52.0)
Hemoglobin: 15.7 g/dL (ref 13.0–17.0)
Lymphocytes Relative: 24.4 % (ref 12.0–46.0)
Lymphs Abs: 1.7 10*3/uL (ref 0.7–4.0)
MCHC: 33.9 g/dL (ref 30.0–36.0)
MCV: 92.4 fl (ref 78.0–100.0)
Monocytes Absolute: 0.6 10*3/uL (ref 0.1–1.0)
Monocytes Relative: 9.3 % (ref 3.0–12.0)
Neutro Abs: 4.4 10*3/uL (ref 1.4–7.7)
Neutrophils Relative %: 64.2 % (ref 43.0–77.0)
Platelets: 313 10*3/uL (ref 150.0–400.0)
RBC: 5.01 Mil/uL (ref 4.22–5.81)
RDW: 13.8 % (ref 11.5–14.6)
WBC: 6.9 10*3/uL (ref 4.5–10.5)

## 2012-05-27 LAB — HEMOGLOBIN A1C: Hgb A1c MFr Bld: 5.9 % (ref 4.6–6.5)

## 2012-05-27 LAB — LIPID PANEL
Cholesterol: 193 mg/dL (ref 0–200)
HDL: 36.2 mg/dL — ABNORMAL LOW (ref >39.00)
Total CHOL/HDL Ratio: 5
Triglycerides: 546 mg/dL — ABNORMAL HIGH (ref 0.0–149.0)
VLDL: 109.2 mg/dL — ABNORMAL HIGH (ref 0.0–40.0)

## 2012-05-27 LAB — BASIC METABOLIC PANEL
BUN: 16 mg/dL (ref 6–23)
CO2: 31 mEq/L (ref 19–32)
Calcium: 9.5 mg/dL (ref 8.4–10.5)
Chloride: 102 mEq/L (ref 96–112)
Creatinine, Ser: 1.1 mg/dL (ref 0.4–1.5)
GFR: 74.14 mL/min (ref >60.00)
Glucose, Bld: 103 mg/dL — ABNORMAL HIGH (ref 70–99)
Potassium: 5.9 mEq/L — ABNORMAL HIGH (ref 3.5–5.1)
Sodium: 140 mEq/L (ref 135–145)

## 2012-05-27 LAB — LDL CHOLESTEROL, DIRECT: Direct LDL: 100.5 mg/dL

## 2012-05-27 NOTE — Progress Notes (Signed)
Patient ID: [name] Allard., male   DOB: [birth year], 46 y.o.   MRN: [id number] Needs DMV form completed  htn- tolerating meds Chronic back pain-- using zanaflex  Reviewed pmh, psh  Exam: nad Chest CTA CV- reg rate Marked scoliosis  A/p-- dmv form completed-- total time 25 minutes

## 2012-05-29 NOTE — Assessment & Plan Note (Signed)
Not using any narcotics

## 2012-06-01 ENCOUNTER — Other Ambulatory Visit (INDEPENDENT_AMBULATORY_CARE_PROVIDER_SITE_OTHER): Payer: Medicare Other

## 2012-06-01 DIAGNOSIS — I10 Essential (primary) hypertension: Secondary | ICD-10-CM | POA: Diagnosis not present

## 2012-06-01 LAB — BASIC METABOLIC PANEL
BUN: 15 mg/dL (ref 6–23)
CO2: 31 mEq/L (ref 19–32)
Calcium: 9.3 mg/dL (ref 8.4–10.5)
Chloride: 102 mEq/L (ref 96–112)
Creatinine, Ser: 0.9 mg/dL (ref 0.4–1.5)
GFR: 90.75 mL/min (ref >60.00)
Glucose, Bld: 64 mg/dL — ABNORMAL LOW (ref 70–99)
Potassium: 4 mEq/L (ref 3.5–5.1)
Sodium: 139 mEq/L (ref 135–145)

## 2012-10-03 ENCOUNTER — Other Ambulatory Visit: Payer: Self-pay | Admitting: Internal Medicine

## 2013-03-14 ENCOUNTER — Other Ambulatory Visit: Payer: Self-pay | Admitting: Internal Medicine

## 2013-04-09 ENCOUNTER — Other Ambulatory Visit: Payer: Self-pay | Admitting: Internal Medicine

## 2013-05-12 ENCOUNTER — Other Ambulatory Visit: Payer: Self-pay | Admitting: Internal Medicine

## 2013-06-17 ENCOUNTER — Other Ambulatory Visit: Payer: Self-pay | Admitting: Internal Medicine

## 2013-06-28 DIAGNOSIS — M47817 Spondylosis without myelopathy or radiculopathy, lumbosacral region: Secondary | ICD-10-CM | POA: Diagnosis not present

## 2013-08-01 ENCOUNTER — Ambulatory Visit (INDEPENDENT_AMBULATORY_CARE_PROVIDER_SITE_OTHER): Payer: Medicare Other

## 2013-08-01 DIAGNOSIS — Z23 Encounter for immunization: Secondary | ICD-10-CM

## 2013-08-14 ENCOUNTER — Telehealth: Payer: Self-pay | Admitting: Internal Medicine

## 2013-08-14 NOTE — Telephone Encounter (Signed)
Patient Information:  Caller Name: Shray  Phone: [phone]  Patient: [name] White, [name] White  Gender: Male  DOB: 1966-05-13  Age: 47 Years  PCP: Birdie Sons (Adults only)  Office Follow Up:  Does the office need to follow up with this patient?: No  Instructions For The Office: N/A  RN Note:  Pt states that he will wait a day or 2 and see if cough goes away; if cough still present in a day or so will call to make an appt  Symptoms  Reason For Call & Symptoms: Pt is calling and states that he has a cough; no other sx besides the cough  Reviewed Health History In EMR: Yes  Reviewed Medications In EMR: Yes  Reviewed Allergies In EMR: Yes  Reviewed Surgeries / Procedures: Yes  Date of Onset of Symptoms: 08/11/2013  Treatments Tried: Nyquil; Halls Cough Drops  Treatments Tried Worked: No  Guideline(s) Used:  Cough  Disposition Per Guideline:   See Within 3 Days in Office  Reason For Disposition Reached:   Taking an ACE Inhibitor medication (e.g., benazepril/LOTENSIN, captopril/CAPOTEN, enalapril/VASOTEC, lisinopril/ZESTRIL)  Advice Given:  Cough Medicines:  Home Remedy - Hard Candy: Hard candy works just as well as medicine-flavored OTC cough drops. Diabetics should use sugar-free candy.  OTC Cough Syrup - Dextromethorphan:  Cough syrups containing the cough suppressant dextromethorphan (DM) may help decrease your cough. Cough syrups work best for coughs that keep you awake at night. They can also sometimes help in the late stages of a respiratory infection when the cough is dry and hacking. They can be used along with cough drops.  Call Back If:  Difficulty breathing  You become worse.  Patient Refused Recommendation:  Patient Will Make Own Appointment  Will monitor cough and call back for an appt

## 2013-09-17 ENCOUNTER — Other Ambulatory Visit: Payer: Self-pay | Admitting: Internal Medicine

## 2013-09-19 ENCOUNTER — Telehealth: Payer: Self-pay | Admitting: Internal Medicine

## 2013-09-19 DIAGNOSIS — M719 Bursopathy, unspecified: Secondary | ICD-10-CM | POA: Diagnosis not present

## 2013-09-19 DIAGNOSIS — M67919 Unspecified disorder of synovium and tendon, unspecified shoulder: Secondary | ICD-10-CM | POA: Diagnosis not present
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
## 2013-09-21 NOTE — Telephone Encounter (Signed)
Pt following up on request to speak w/ dr swords about this issues. Pt has new forms that dr swords needs to fill out asap. pls advise
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
## 2013-09-28 ENCOUNTER — Ambulatory Visit (INDEPENDENT_AMBULATORY_CARE_PROVIDER_SITE_OTHER): Payer: Medicare Other | Admitting: Internal Medicine

## 2013-09-28 ENCOUNTER — Encounter: Payer: Self-pay | Admitting: *Deleted

## 2013-09-28 ENCOUNTER — Encounter: Payer: Self-pay | Admitting: Internal Medicine

## 2013-09-28 VITALS — BP 122/70 | HR 93 | Temp 97.7°F | Ht 71.0 in | Wt 184.0 lb

## 2013-09-28 DIAGNOSIS — I471 Supraventricular tachycardia: Secondary | ICD-10-CM

## 2013-09-28 DIAGNOSIS — I498 Other specified cardiac arrhythmias: Secondary | ICD-10-CM

## 2013-09-28 NOTE — Progress Notes (Signed)
Pre visit review using our clinic review tool, if applicable. No additional management support is needed unless otherwise documented below in the visit note. 

## 2013-09-30 NOTE — Progress Notes (Signed)
Has had trouble with dmv forms  Apparently dmv is requiring forms for an SVT that he had years ago   Discussed x 10 minutes 5 minutes spent writing letter.  There is no need to continue dmv form completion

## 2013-10-06 ENCOUNTER — Telehealth: Payer: Self-pay | Admitting: Internal Medicine

## 2013-10-06 NOTE — Telephone Encounter (Signed)
Pt saw Dr Leanne Chang on 2/12 and got some medical forms filled out that were to be sent to [facility] in  [place].  [place] [employer] states they never received paperwork, and they are going to take pt's license if they do not receive..  Pt needs this paperwork  AND a letter that dr sword wrote sent ASAP.  Can we locate?  Loann QuillLaurin Coder Fax:   [phone]

## 2013-10-09 NOTE — Telephone Encounter (Signed)
Letter was faxed.

## 2013-10-11 ENCOUNTER — Other Ambulatory Visit: Payer: Self-pay | Admitting: Internal Medicine

## 2013-10-11 ENCOUNTER — Telehealth: Payer: Self-pay | Admitting: Internal Medicine

## 2013-10-11 NOTE — Telephone Encounter (Signed)
Pt stated DMV still needs packet completed and fax. Per pt DMV stated the letter was not sufficient,

## 2013-10-13 DIAGNOSIS — M719 Bursopathy, unspecified: Secondary | ICD-10-CM | POA: Diagnosis not present

## 2013-10-13 DIAGNOSIS — M67919 Unspecified disorder of synovium and tendon, unspecified shoulder: Secondary | ICD-10-CM | POA: Diagnosis not present

## 2013-10-13 NOTE — Telephone Encounter (Signed)
Faxed forms to [facility]

## 2013-11-03 ENCOUNTER — Encounter: Payer: Self-pay | Admitting: Family Medicine

## 2013-11-03 ENCOUNTER — Ambulatory Visit (INDEPENDENT_AMBULATORY_CARE_PROVIDER_SITE_OTHER): Payer: Medicare Other | Admitting: Family Medicine

## 2013-11-03 VITALS — BP 130/80 | Temp 98.0°F | Wt 185.0 lb

## 2013-11-03 DIAGNOSIS — J329 Chronic sinusitis, unspecified: Secondary | ICD-10-CM

## 2013-11-03 MED ORDER — HYDROCOD POLST-CHLORPHEN POLST 10-8 MG/5ML PO LQCR: 5.0000 mL | Freq: Two times a day (BID) | ORAL | Status: DC | PRN

## 2013-11-03 MED ORDER — FLUTICASONE PROPIONATE 50 MCG/ACT NA SUSP: 2.0000 | Freq: Every day | NASAL | Status: DC

## 2013-11-03 NOTE — Progress Notes (Signed)
Chief Complaint  Patient presents with  . Cough    sneeing, and congestion in chest     HPI:  -started: bout 4 days ago -symptoms:nasal congestion, sore throat, cough, sneezing, sinus pressure on and off but getting beter -denies:fever, SOB, NVD, tooth pain -has tried: nettie pot and sudafed -sick contacts/travel/risks: denies flu exposure or Ebola risks -Hx of: allergies ROS: See pertinent positives and negatives per HPI.  Past Medical History  Diagnosis Date  . Hypertension   . Chronic pain syndrome   . Blood transfusion without reported diagnosis     1960's  . Nephrolithiasis   . Hyperlipidemia   . AF (atrial fibrillation)     nonsustained  . PSVT (paroxysmal supraventricular tachycardia)   . Cardiomyopathy secondary     Past Surgical History  Procedure Laterality Date  . Back surgery      01/12/00 and 1982  . Replacement total knee      1999  . Av node ablation      09/16/07    Family History  Problem Relation Age of Onset  . Osteoporosis Father   . Rheum arthritis Mother     History   Social History  . Marital Status: Legally Separated    Spouse Name: N/A    Number of Children: N/A  . Years of Education: N/A   Social History Main Topics  . Smoking status: Former Research scientist (life sciences)  . Smokeless tobacco: None  . Alcohol Use: Yes  . Drug Use: No     Comment: narcotic dependent  . Sexual Activity:    Other Topics Concern  . None   Social History Narrative  . None    Current outpatient prescriptions:acetaminophen (TYLENOL) 500 MG tablet, Take 500 mg by mouth as needed.  , Disp: , Rfl: ;  amLODipine (NORVASC) 5 MG tablet, TAKE 2 TABLETS BY MOUTH ONCE DAILY, Disp: 60 tablet, Rfl: 5;  diphenhydrAMINE (BENADRYL) 25 MG tablet, Take 25 mg by mouth as needed.  , Disp: , Rfl: ;  ibuprofen (ADVIL,MOTRIN) 200 MG tablet, Take 200 mg by mouth 3 (three) times daily.  , Disp: , Rfl:  lisinopril (PRINIVIL,ZESTRIL) 20 MG tablet, TAKE 1 TABLET BY MOUTH ONCE DAILY, Disp: 90  tablet, Rfl: 0;  chlorpheniramine-HYDROcodone (TUSSIONEX PENNKINETIC ER) 10-8 MG/5ML LQCR, Take 5 mLs by mouth every 12 (twelve) hours as needed., Disp: 115 mL, Rfl: 0;  fluticasone (FLONASE) 50 MCG/ACT nasal spray, Place 2 sprays into both nostrils daily., Disp: 16 g, Rfl: 0  EXAM:  Filed Vitals:   11/03/13 1408  BP: 130/80  Temp: 98 F (36.7 C)    Body mass index is 25.81 kg/(m^2).  GENERAL: vitals reviewed and listed above, alert, oriented, appears well hydrated and in no acute distress  HEENT: atraumatic, conjunttiva clear, no obvious abnormalities on inspection of external nose and ears, normal appearance of ear canals and TMs, clear nasal congestion, mild post oropharyngeal erythema with PND, no tonsillar edema or exudate, no sinus TTP  NECK: no obvious masses on inspection  LUNGS: clear to auscultation bilaterally, no wheezes, rales or rhonchi, good air movement  CV: HRRR, no peripheral edema  MS: moves all extremities without noticeable abnormality  PSYCH: pleasant and cooperative, no obvious depression or anxiety  ASSESSMENT AND PLAN:  Discussed the following assessment and plan:  Rhinosinusitis - Plan: chlorpheniramine-HYDROcodone (TUSSIONEX PENNKINETIC ER) 10-8 MG/5ML LQCR, fluticasone (FLONASE) 50 MCG/ACT nasal spray  -given HPI and exam findings today, a serious infection or illness is unlikely. We discussed potential  etiologies, with allergic or VURI being most likely, and advised supportive care and monitoring. We discussed treatment side effects, likely course, antibiotic misuse, transmission, and signs of developing a serious illness. -start flonase, antihistamine, supportive care, cough medication and return precuations -of course, we advised to return or notify a doctor immediately if symptoms worsen or persist or new concerns arise.    Patient Instructions  INSTRUCTIONS FOR UPPER RESPIRATORY INFECTION:  -plenty of rest and fluids  -nasal saline wash 2-3  times daily (use prepackaged nasal saline or bottled/distilled water if making your own)   -clean nose with nasal saline before using the nasal steroid or sinex  -start flonase daily and claritin daily  -can use sinex or afrin nasal spray for drainage and nasal congestion - but do NOT use longer then 3-4 days  -can use tylenol or ibuprofen as directed for aches and sorethroat  -in the winter time, using a humidifier at night is helpful (please follow cleaning instructions)  -if you are taking a cough medication - use only as directed, may also try a teaspoon of honey to coat the throat and throat lozenges  -for sore throat, salt water gargles can help  -follow up if you have fevers, facial pain, tooth pain, difficulty breathing or are worsening or not getting better in 5-7 days      Stevens Magwood R.

## 2013-11-03 NOTE — Progress Notes (Signed)
Pre visit review using our clinic review tool, if applicable. No additional management support is needed unless otherwise documented below in the visit note. 

## 2013-11-03 NOTE — Patient Instructions (Signed)
INSTRUCTIONS FOR UPPER RESPIRATORY INFECTION:  -plenty of rest and fluids  -nasal saline wash 2-3 times daily (use prepackaged nasal saline or bottled/distilled water if making your own)   -clean nose with nasal saline before using the nasal steroid or sinex  -start flonase daily and claritin daily  -can use sinex or afrin nasal spray for drainage and nasal congestion - but do NOT use longer then 3-4 days  -can use tylenol or ibuprofen as directed for aches and sorethroat  -in the winter time, using a humidifier at night is helpful (please follow cleaning instructions)  -if you are taking a cough medication - use only as directed, may also try a teaspoon of honey to coat the throat and throat lozenges  -for sore throat, salt water gargles can help  -follow up if you have fevers, facial pain, tooth pain, difficulty breathing or are worsening or not getting better in 5-7 days

## 2013-12-14 ENCOUNTER — Other Ambulatory Visit: Payer: Self-pay | Admitting: Internal Medicine

## 2014-01-22 ENCOUNTER — Telehealth: Payer: Self-pay | Admitting: Internal Medicine

## 2014-01-22 NOTE — Telephone Encounter (Signed)
Pt states the dmv has advised him that he is still in the medical review program.  They advised pt that he (pt) needs to write a letter for consideration to be removed from the medical review program. Pt would like to know if there is anything dr swords can do to help w/ this issues as far as wording his letter to them and his thought on this. Pt thinks this doesn't make since now that they are asking for pt to write a letter, bc dr swords already wrote a letter.

## 2014-01-22 NOTE — Telephone Encounter (Signed)
We wrote the letter to [facility] in February.  Please advise

## 2014-01-25 NOTE — Telephone Encounter (Signed)
He will have to work through [employer]

## 2014-01-26 NOTE — Telephone Encounter (Signed)
Pt aware.

## 2014-04-12 ENCOUNTER — Other Ambulatory Visit: Payer: Self-pay | Admitting: Internal Medicine

## 2014-05-10 MED ORDER — AMLODIPINE BESYLATE 5 MG PO TABS: ORAL_TABLET | ORAL | Status: DC

## 2014-05-10 NOTE — Addendum Note (Signed)
Addended by: Townsend Roger D on: 05/10/2014 10:28 AM   Modules accepted: Orders

## 2014-05-10 NOTE — Telephone Encounter (Signed)
[facility] below medication.

## 2014-06-12 ENCOUNTER — Other Ambulatory Visit: Payer: Self-pay | Admitting: Internal Medicine

## 2014-07-03 ENCOUNTER — Ambulatory Visit (INDEPENDENT_AMBULATORY_CARE_PROVIDER_SITE_OTHER): Payer: Medicare Other

## 2014-07-03 DIAGNOSIS — Z23 Encounter for immunization: Secondary | ICD-10-CM

## 2014-07-06 ENCOUNTER — Encounter: Payer: Self-pay | Admitting: Family Medicine

## 2014-07-06 ENCOUNTER — Ambulatory Visit (INDEPENDENT_AMBULATORY_CARE_PROVIDER_SITE_OTHER): Payer: Medicare Other | Admitting: Family Medicine

## 2014-07-06 VITALS — BP 135/76 | HR 107 | Temp 98.5°F | Ht 71.0 in | Wt 187.0 lb

## 2014-07-06 DIAGNOSIS — J0100 Acute maxillary sinusitis, unspecified: Secondary | ICD-10-CM | POA: Diagnosis not present

## 2014-07-06 MED ORDER — LEVOFLOXACIN 500 MG PO TABS: 500.0000 mg | ORAL_TABLET | Freq: Every day | ORAL | Status: AC

## 2014-07-06 NOTE — Progress Notes (Signed)
   Subjective:    Patient ID: [name] White., male    DOB: 02/06/1966, 48 y.o.   MRN: [id number]  HPI Here for 4 days of sinus pressure, PND, and coughing up green sputum. No fever.    Review of Systems  Constitutional: Negative.   HENT: Positive for congestion, postnasal drip and sinus pressure.   Eyes: Negative.   Respiratory: Positive for cough and chest tightness.        Objective:   Physical Exam  Constitutional: He appears well-developed and well-nourished.  HENT:  Right Ear: External ear normal.  Left Ear: External ear normal.  Nose: Nose normal.  Mouth/Throat: Oropharynx is clear and moist.  Eyes: Conjunctivae are normal.  Pulmonary/Chest: Effort normal and breath sounds normal.  Lymphadenopathy:    He has no cervical adenopathy.          Assessment & Plan:  Add Mucinex

## 2014-07-06 NOTE — Progress Notes (Signed)
Pre visit review using our clinic review tool, if applicable. No additional management support is needed unless otherwise documented below in the visit note. 

## 2014-10-19 DIAGNOSIS — M1711 Unilateral primary osteoarthritis, right knee: Secondary | ICD-10-CM | POA: Diagnosis not present

## 2014-11-06 DIAGNOSIS — M1711 Unilateral primary osteoarthritis, right knee: Secondary | ICD-10-CM | POA: Diagnosis not present

## 2014-11-07 ENCOUNTER — Other Ambulatory Visit: Payer: Self-pay | Admitting: Internal Medicine

## 2014-11-22 ENCOUNTER — Other Ambulatory Visit: Payer: Self-pay | Admitting: Internal Medicine

## 2014-12-04 ENCOUNTER — Encounter: Payer: Self-pay | Admitting: Family Medicine

## 2014-12-04 ENCOUNTER — Ambulatory Visit (INDEPENDENT_AMBULATORY_CARE_PROVIDER_SITE_OTHER): Payer: Medicare Other | Admitting: Family Medicine

## 2014-12-04 VITALS — BP 118/72 | HR 90 | Temp 97.9°F | Wt 185.0 lb

## 2014-12-04 DIAGNOSIS — J329 Chronic sinusitis, unspecified: Secondary | ICD-10-CM

## 2014-12-04 DIAGNOSIS — E785 Hyperlipidemia, unspecified: Secondary | ICD-10-CM | POA: Diagnosis not present

## 2014-12-04 DIAGNOSIS — M4120 Other idiopathic scoliosis, site unspecified: Secondary | ICD-10-CM | POA: Diagnosis not present

## 2014-12-04 DIAGNOSIS — B9789 Other viral agents as the cause of diseases classified elsewhere: Secondary | ICD-10-CM

## 2014-12-04 DIAGNOSIS — R739 Hyperglycemia, unspecified: Secondary | ICD-10-CM

## 2014-12-04 DIAGNOSIS — B349 Viral infection, unspecified: Secondary | ICD-10-CM

## 2014-12-04 DIAGNOSIS — Z20828 Contact with and (suspected) exposure to other viral communicable diseases: Secondary | ICD-10-CM

## 2014-12-04 DIAGNOSIS — Z1211 Encounter for screening for malignant neoplasm of colon: Secondary | ICD-10-CM

## 2014-12-04 DIAGNOSIS — M1711 Unilateral primary osteoarthritis, right knee: Secondary | ICD-10-CM | POA: Insufficient documentation

## 2014-12-04 DIAGNOSIS — Z87891 Personal history of nicotine dependence: Secondary | ICD-10-CM | POA: Insufficient documentation

## 2014-12-04 DIAGNOSIS — I10 Essential (primary) hypertension: Secondary | ICD-10-CM

## 2014-12-04 MED ORDER — PREDNISONE 20 MG PO TABS: ORAL_TABLET | ORAL | Status: DC

## 2014-12-04 MED ORDER — AMLODIPINE BESYLATE 10 MG PO TABS: 10.0000 mg | ORAL_TABLET | Freq: Every day | ORAL | Status: DC

## 2014-12-04 MED ORDER — LISINOPRIL 20 MG PO TABS: 20.0000 mg | ORAL_TABLET | Freq: Every day | ORAL | Status: DC

## 2014-12-04 NOTE — Progress Notes (Signed)
[name] Reddish, MD Phone: [phone]  Subjective:  Patient presents today to establish care with me as their new primary care provider. Patient was formerly a patient of Dr. Leanne Chang. Chief complaint-noted.   See problem oriented charting ROS- no shortness of breath or chest pain. No headache or blurry vision.   The following were reviewed and entered/updated in epic: Past Medical History  Diagnosis Date  . Hypertension   . Chronic pain syndrome   . Blood transfusion without reported diagnosis     1960's  . Nephrolithiasis   . Hyperlipidemia   . AF (atrial fibrillation)     nonsustained  . PSVT (paroxysmal supraventricular tachycardia)   . Cardiomyopathy secondary    Patient Active Problem List   Diagnosis Date Noted  . SVT/ PSVT/ PAT 06/16/2010    Priority: High  . Hyperglycemia 05/27/2012    Priority: Medium  . SCOLIOSIS, THORACOLUMBAR 01/06/2010    Priority: Medium  . Hyperlipidemia 04/14/2007    Priority: Medium  . Essential hypertension 03/07/2007    Priority: Medium  . Osteoarthritis of right knee 12/04/2014    Priority: Low  . Former smoker 12/04/2014    Priority: Low   Past Surgical History  Procedure Laterality Date  . Back surgery      01/12/00 and 1982  . Replacement total knee      1999, L. Murphy/wainer.   . Av node ablation      09/16/07. SVT Dr. Caryl Comes  . Inguinal hernia repair      L    Family History  Problem Relation Age of Onset  . Breast cancer Mother   . Diabetes Father   . Heart attack Father     late 64s  . Atrial fibrillation Father     Medications- reviewed and updated Current Outpatient Prescriptions  Medication Sig Dispense Refill  . acetaminophen (TYLENOL) 500 MG tablet Take 500 mg by mouth as needed.      Marland Kitchen amLODipine (NORVASC) 10 MG tablet Take 1 tablet (10 mg total) by mouth daily. 90 tablet 3  . diphenhydrAMINE (BENADRYL) 25 MG tablet Take 25 mg by mouth as needed.      . fluticasone (FLONASE) 50 MCG/ACT nasal spray  Place 2 sprays into both nostrils daily. (Patient not taking: Reported on 12/04/2014) 16 g 0  . ibuprofen (ADVIL,MOTRIN) 200 MG tablet Take 200 mg by mouth 3 (three) times daily as needed.     Marland Kitchen lisinopril (PRINIVIL,ZESTRIL) 20 MG tablet Take 1 tablet (20 mg total) by mouth daily. 90 tablet 3  . predniSONE (DELTASONE) 20 MG tablet Take 2 tabs for 3 days, then 1 tab for 4 days 10 tablet 0   No current facility-administered medications for this visit.    Allergies-reviewed and updated No Known Allergies  History   Social History  . Marital Status: Legally Separated    Spouse Name: N/A  . Number of Children: N/A  . Years of Education: N/A   Social History Main Topics  . Smoking status: Former Smoker -- 0.50 packs/day for 20 years    Types: Cigarettes    Quit date: 08/18/1991  . Smokeless tobacco: Never Used  . Alcohol Use: 0.0 oz/week    0 Standard drinks or equivalent per week     Comment: occ  . 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 Sexual Activity: Not on file   Other Topics Concern  . None   Social History Narrative   Single, divorced.  2 daughters. No grandkids.       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 Out 2001.           ROS--See HPI   Objective: BP 118/72 mmHg  Pulse 90  Temp(Src) 97.9 F (36.6 C)  Wt 185 lb (83.915 kg) Gen: NAD, resting comfortably HEENT: Mucous membranes are moist. Oropharynx normal. Tender maxillary sinuses. Green/yellow discharge.  Neck: no thyromegaly CV: RRR no murmurs rubs or gallops Lungs: CTAB no crackles, wheeze, rhonchi Abdomen: soft/nontender/nondistended/normal bowel sounds. No rebound or guarding.  Ext: no edema Skin: warm, dry, long scar on back Neuro: grossly normal, moves all extremities, PERRLA  Assessment/Plan:  Sinusitis S: 1 week of worsening symptoms including sinus congestion, productive cough of green sputum, sinus pressure. Has tried mucinex dm and claritin with little relief.  13th was first day of illness so 22nd would be day 10. History of allergy shots. Has had sinus infection before cleared with levaquin A/P: does not meet any criteria for bacterial sinusitis. Treat with prednisone to decrease inflammation. If symptoms persist past day 10, which would be Friday AM-would be willing to call in Augmentin x 7 days.    Hyperlipidemia S: no recent lipids, not on statin. Previously with elevated LDL, low HDL, high trig A/P: check lipids today and calculate 10 year risk. Recommend statin if trig >200.     Essential hypertension S: controlled on Amlodipine 10mg , lisinopril 20mg  A/P: continue current rx.     SCOLIOSIS, THORACOLUMBAR S: Chronic Pain, on disability through [employer] [address]- at [facility] for 3 months. No back issues over 17 years.  Dr. Deri Fuelling did 3 procedures in lumbar spine.  Spine largely fused lumbar and thoracic.  Has been in pain clinics in past with history of narcotics- now just takes aleve or tylenol  A/P: patient walks hunched over and deals with chronic pain but prefers to be off of narcotics. Will continue prn aleve and tylenol. Check Cr to make sure kidney's can tolerate nsaids.     Hyperglycemia a1c 5.9 three years ago, check a1c today   6 month follow up  Future fasting labs Orders Placed This Encounter  Procedures  . CBC    [place]    Standing Status: Future     Number of Occurrences:      Standing Expiration Date: 12/04/2015  . Comprehensive metabolic panel    [place]    Standing Status: Future     Number of Occurrences:      Standing Expiration Date: 12/04/2015    Order Specific Question:  Has the patient fasted?    Answer:  No  . Lipid panel    [place]    Standing Status: Future     Number of Occurrences:      Standing Expiration Date: 12/04/2015    Order Specific Question:  Has the patient fasted?    Answer:  No  . Hemoglobin A1c    [place]    Standing Status: Future     Number  of Occurrences:      Standing Expiration Date: 12/04/2015  . TSH    [place]    Standing Status: Future     Number of Occurrences:      Standing Expiration Date: 12/04/2015  . HIV antibody    [facility]    Standing Status: Future     Number of Occurrences:      Standing Expiration Date: 12/04/2015  . Ambulatory referral to Gastroenterology  Referral Priority:  Routine    Referral Type:  Consultation    Referral Reason:  Specialty Services Required    Requested Specialty:  Gastroenterology    Number of Visits Requested:  1  . POCT urinalysis dipstick    Standing Status: Future     Number of Occurrences:      Standing Expiration Date: 12/04/2015    Meds ordered this encounter  Medications  . predniSONE (DELTASONE) 20 MG tablet    Sig: Take 2 tabs for 3 days, then 1 tab for 4 days    Dispense:  10 tablet    Refill:  0  . amLODipine (NORVASC) 10 MG tablet    Sig: Take 1 tablet (10 mg total) by mouth daily.    Dispense:  90 tablet    Refill:  3  . lisinopril (PRINIVIL,ZESTRIL) 20 MG tablet    Sig: Take 1 tablet (20 mg total) by mouth daily.    Dispense:  90 tablet    Refill:  3   rhe

## 2014-12-04 NOTE — Assessment & Plan Note (Signed)
S: no recent lipids, not on statin. Previously with elevated LDL, low HDL, high trig A/P: check lipids today and calculate 10 year risk. Recommend statin if trig >200.

## 2014-12-04 NOTE — Assessment & Plan Note (Signed)
S: Chronic Pain, on disability through [employer] [address]- at [facility] for 3 months. No back issues over 17 years.  Dr. Deri Fuelling did 3 procedures in lumbar spine.  Spine largely fused lumbar and thoracic.  Has been in pain clinics in past with history of narcotics- now just takes aleve or tylenol  A/P: patient walks hunched over and deals with chronic pain but prefers to be off of narcotics. Will continue prn aleve and tylenol. Check Cr to make sure kidney's can tolerate nsaids.

## 2014-12-04 NOTE — Assessment & Plan Note (Signed)
a1c 5.9 three years ago, check a1c today

## 2014-12-04 NOTE — Assessment & Plan Note (Signed)
S: controlled on Amlodipine 10mg , lisinopril 20mg  A/P: continue current rx.

## 2014-12-04 NOTE — Patient Instructions (Addendum)
Sinusitis-viral per definition < 10 days  Prednisone for 7 days  If not significantly improving by Friday, call in augmentin  Call me early Friday morning and hopeful to send in rx by end of the day  Come back for fasting labs at your convenience in next few weeks. Nothing but water after midnight.   Check in 6 months from now.

## 2014-12-07 ENCOUNTER — Telehealth: Payer: Self-pay | Admitting: Family Medicine

## 2014-12-07 MED ORDER — AMOXICILLIN-POT CLAVULANATE 875-125 MG PO TABS: 1.0000 | ORAL_TABLET | Freq: Two times a day (BID) | ORAL | Status: DC

## 2014-12-07 NOTE — Telephone Encounter (Signed)
[facility], please inform patient

## 2014-12-07 NOTE — Telephone Encounter (Signed)
Pt was seen on 4-19 for sinus and allergy issues. Pt was given prednisone and was told if no better md will [facility] [address]

## 2014-12-07 NOTE — Telephone Encounter (Signed)
What strength of augmentin and instrucstions?

## 2014-12-07 NOTE — Telephone Encounter (Signed)
Pt informed

## 2014-12-11 ENCOUNTER — Other Ambulatory Visit (INDEPENDENT_AMBULATORY_CARE_PROVIDER_SITE_OTHER): Payer: Medicare Other

## 2014-12-11 DIAGNOSIS — R739 Hyperglycemia, unspecified: Secondary | ICD-10-CM | POA: Diagnosis not present

## 2014-12-11 DIAGNOSIS — Z20828 Contact with and (suspected) exposure to other viral communicable diseases: Secondary | ICD-10-CM | POA: Diagnosis not present

## 2014-12-11 DIAGNOSIS — E785 Hyperlipidemia, unspecified: Secondary | ICD-10-CM

## 2014-12-11 DIAGNOSIS — I10 Essential (primary) hypertension: Secondary | ICD-10-CM | POA: Diagnosis not present

## 2014-12-11 LAB — COMPREHENSIVE METABOLIC PANEL
ALT: 27 U/L (ref 0–53)
AST: 26 U/L (ref 0–37)
Albumin: 4.3 g/dL (ref 3.5–5.2)
Alkaline Phosphatase: 52 U/L (ref 39–117)
BUN: 14 mg/dL (ref 6–23)
CO2: 34 mEq/L — ABNORMAL HIGH (ref 19–32)
Calcium: 10 mg/dL (ref 8.4–10.5)
Chloride: 99 mEq/L (ref 96–112)
Creatinine, Ser: 0.82 mg/dL (ref 0.40–1.50)
GFR: 105.16 mL/min (ref >60.00)
Glucose, Bld: 105 mg/dL — ABNORMAL HIGH (ref 70–99)
Potassium: 4.3 mEq/L (ref 3.5–5.1)
Sodium: 140 mEq/L (ref 135–145)
Total Bilirubin: 0.8 mg/dL (ref 0.2–1.2)
Total Protein: 7.1 g/dL (ref 6.0–8.3)

## 2014-12-11 LAB — LIPID PANEL
Cholesterol: 180 mg/dL (ref 0–200)
HDL: 55.2 mg/dL (ref >39.00)
LDL Cholesterol: 98 mg/dL (ref 0–99)
NonHDL: 124.8
Total CHOL/HDL Ratio: 3
Triglycerides: 134 mg/dL (ref 0.0–149.0)
VLDL: 26.8 mg/dL (ref 0.0–40.0)

## 2014-12-11 LAB — CBC
HCT: 46 % (ref 39.0–52.0)
Hemoglobin: 15.5 g/dL (ref 13.0–17.0)
MCHC: 33.7 g/dL (ref 30.0–36.0)
MCV: 90.3 fl (ref 78.0–100.0)
Platelets: 325 10*3/uL (ref 150.0–400.0)
RBC: 5.09 Mil/uL (ref 4.22–5.81)
RDW: 14.1 % (ref 11.5–15.5)
WBC: 8.5 10*3/uL (ref 4.0–10.5)

## 2014-12-11 LAB — HEMOGLOBIN A1C: Hgb A1c MFr Bld: 6 % (ref 4.6–6.5)

## 2014-12-11 LAB — TSH: TSH: 1.88 u[IU]/mL (ref 0.35–4.50)

## 2014-12-12 LAB — HIV ANTIBODY (ROUTINE TESTING W REFLEX): HIV 1&2 Ab, 4th Generation: NONREACTIVE

## 2015-02-05 DIAGNOSIS — M1711 Unilateral primary osteoarthritis, right knee: Secondary | ICD-10-CM | POA: Diagnosis not present

## 2015-06-05 ENCOUNTER — Ambulatory Visit: Payer: Medicare Other | Admitting: Family Medicine

## 2015-06-21 ENCOUNTER — Ambulatory Visit (INDEPENDENT_AMBULATORY_CARE_PROVIDER_SITE_OTHER): Payer: Medicare Other

## 2015-06-21 DIAGNOSIS — Z23 Encounter for immunization: Secondary | ICD-10-CM

## 2015-09-09 ENCOUNTER — Encounter: Payer: Self-pay | Admitting: Family Medicine

## 2015-09-09 ENCOUNTER — Ambulatory Visit (INDEPENDENT_AMBULATORY_CARE_PROVIDER_SITE_OTHER): Payer: Medicare Other | Admitting: Family Medicine

## 2015-09-09 VITALS — BP 120/70 | HR 92 | Temp 97.7°F | Wt 182.5 lb

## 2015-09-09 DIAGNOSIS — J069 Acute upper respiratory infection, unspecified: Secondary | ICD-10-CM | POA: Diagnosis not present

## 2015-09-09 DIAGNOSIS — J309 Allergic rhinitis, unspecified: Secondary | ICD-10-CM

## 2015-09-09 DIAGNOSIS — B9789 Other viral agents as the cause of diseases classified elsewhere: Principal | ICD-10-CM

## 2015-09-09 NOTE — Patient Instructions (Addendum)
Mucinex DM can be used for cough as needed. Use Zyrtec and flonase as discussed for symptoms of allergic rhinitis. Contact clinic for follow up if symptoms do not improve in 3-4 days, worsen, or you develop a tem >101.   Viral Infections A viral infection can be caused by different types of viruses.Most viral infections are not serious and resolve on their own. However, some infections may cause severe symptoms and may lead to further complications. SYMPTOMS Viruses can frequently cause:  Minor sore throat.  Aches and pains.  Headaches.  Runny nose.  Different types of rashes.  Watery eyes.  Tiredness.  Cough.  Loss of appetite.  Gastrointestinal infections, resulting in nausea, vomiting, and diarrhea. These symptoms do not respond to antibiotics because the infection is not caused by bacteria. However, you might catch a bacterial infection following the viral infection. This is sometimes called a "superinfection." Symptoms of such a bacterial infection may include:  Worsening sore throat with pus and difficulty swallowing.  Swollen neck glands.  Chills and a high or persistent fever.  Severe headache.  Tenderness over the sinuses.  Persistent overall ill feeling (malaise), muscle aches, and tiredness (fatigue).  Persistent cough.  Yellow, green, or brown mucus production with coughing. HOME CARE INSTRUCTIONS   Only take over-the-counter or prescription medicines for pain, discomfort, diarrhea, or fever as directed by your caregiver.  Drink enough water and fluids to keep your urine clear or pale yellow. Sports drinks can provide valuable electrolytes, sugars, and hydration.  Get plenty of rest and maintain proper nutrition. Soups and broths with crackers or rice are fine. SEEK IMMEDIATE MEDICAL CARE IF:   You have severe headaches, shortness of breath, chest pain, neck pain, or an unusual rash.  You have uncontrolled vomiting, diarrhea, or you are unable to  keep down fluids.  You or your child has an oral temperature above 102 F (38.9 C), not controlled by medicine.  Your baby is older than 3 months with a rectal temperature of 102 F (38.9 C) or higher.  Your baby is 21 months old or younger with a rectal temperature of 100.4 F (38 C) or higher. MAKE SURE YOU:   Understand these instructions.  Will watch your condition.  Will get help right away if you are not doing well or get worse.   This information is not intended to replace advice given to you by your health care provider. Make sure you discuss any questions you have with your health care provider.   Document Released: 05/13/2005 Document Revised: 10/26/2011 Document Reviewed: 01/09/2015 Elsevier Interactive Patient Education 2016 [employer].  Allergic Rhinitis Allergic rhinitis is when the mucous membranes in the nose respond to allergens. Allergens are particles in the air that cause your body to have an allergic reaction. This causes you to release allergic antibodies. Through a chain of events, these eventually cause you to release histamine into the blood stream. Although meant to protect the body, it is this release of histamine that causes your discomfort, such as frequent sneezing, congestion, and an itchy, runny nose.  CAUSES Seasonal allergic rhinitis (hay fever) is caused by pollen allergens that may come from grasses, trees, and weeds. Year-round allergic rhinitis (perennial allergic rhinitis) is caused by allergens such as house dust mites, pet dander, and mold spores. SYMPTOMS  Nasal stuffiness (congestion).  Itchy, runny nose with sneezing and tearing of the eyes. DIAGNOSIS Your health care provider can help you determine the allergen or allergens that trigger your symptoms.  If you and your health care provider are unable to determine the allergen, skin or blood testing may be used. Your health care provider will diagnose your condition after taking your health  history and performing a physical exam. Your health care provider may assess you for other related conditions, such as asthma, pink eye, or an ear infection. TREATMENT Allergic rhinitis does not have a cure, but it can be controlled by:  Medicines that block allergy symptoms. These may include allergy shots, nasal sprays, and oral antihistamines.  Avoiding the allergen. Hay fever may often be treated with antihistamines in pill or nasal spray forms. Antihistamines block the effects of histamine. There are over-the-counter medicines that may help with nasal congestion and swelling around the eyes. Check with your health care provider before taking or giving this medicine. If avoiding the allergen or the medicine prescribed do not work, there are many new medicines your health care provider can prescribe. Stronger medicine may be used if initial measures are ineffective. Desensitizing injections can be used if medicine and avoidance does not work. Desensitization is when a patient is given ongoing shots until the body becomes less sensitive to the allergen. Make sure you follow up with your health care provider if problems continue. HOME CARE INSTRUCTIONS It is not possible to completely avoid allergens, but you can reduce your symptoms by taking steps to limit your exposure to them. It helps to know exactly what you are allergic to so that you can avoid your specific triggers. SEEK MEDICAL CARE IF:  You have a fever.  You develop a cough that does not stop easily (persistent).  You have shortness of breath.  You start wheezing.  Symptoms interfere with normal daily activities.   This information is not intended to replace advice given to you by your health care provider. Make sure you discuss any questions you have with your health care provider.   Document Released: 04/28/2001 Document Revised: 08/24/2014 Document Reviewed: 04/10/2013 Elsevier Interactive Patient Education [employer].

## 2015-09-09 NOTE — Progress Notes (Signed)
Pre visit review using our clinic review tool, if applicable. No additional management support is needed unless otherwise documented below in the visit note. 

## 2015-09-09 NOTE — Progress Notes (Signed)
Subjective:    Patient ID: [name] Cullinan., male    DOB: 1966-01-08, 50 y.o.   MRN: [id number]  HPI  [name] White is a 50 year old male who presents today with sneezing, nasal congestion, nasal drainage noted as yellow/green at times, fatigue, and cough that can be productive of yellow/clear sputum "occasionally but not always" for 3 days. Cough does not wake patient at night but patient states he is worried about his cough since he is a smoker with a 4 pack year history. Associated symptoms of watery eyes with a history of allergic rhinitis. He denies history of asthma and bronchitis, sinus pain, dental pain, ear pain, fever. Treatments include OTC Alka Seltzer plus which has provided moderate benefit for symptoms.    Review of Systems  Constitutional: Positive for fatigue.  HENT: Positive for congestion, postnasal drip, rhinorrhea and sneezing. Negative for dental problem, ear discharge, ear pain and sore throat.   Eyes:       Watery eyes  Respiratory: Negative for cough, chest tightness, shortness of breath and wheezing.   Cardiovascular: Negative for chest pain and palpitations.  Gastrointestinal: Negative for nausea, vomiting and diarrhea.  Neurological: Negative for light-headedness.   Past Medical History  Diagnosis Date  . Hypertension   . Chronic pain syndrome   . Blood transfusion without reported diagnosis     1960's  . Nephrolithiasis   . Hyperlipidemia   . AF (atrial fibrillation) (HCC)     nonsustained  . PSVT (paroxysmal supraventricular tachycardia) ([place])   . Cardiomyopathy secondary     Social History   Social History  . Marital Status: Legally Separated    Spouse Name: N/A  . Number of Children: N/A  . Years of Education: N/A   Occupational History  . Not on file.   Social History Main Topics  . Smoking status: Former Smoker -- 0.50 packs/day for 20 years    Types: Cigarettes    Quit date: 08/18/1991  . Smokeless tobacco: Never Used  . Alcohol Use: 0.0  oz/week    0 Standard drinks or equivalent per week     Comment: occ  . 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 Sexual Activity: Not on file   Other Topics Concern  . Not on file   Social History Narrative   Single, divorced. 2 daughters. No grandkids.       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 Out 2001.           Past Surgical History  Procedure Laterality Date  . Back surgery      01/12/00 and 1982  . Replacement total knee      1999, L. Murphy/wainer.   . Av node ablation      09/16/07. SVT Dr. Caryl Comes  . Inguinal hernia repair      L    Family History  Problem Relation Age of Onset  . Breast cancer Mother   . Diabetes Father   . Heart attack Father     late 62s  . Atrial fibrillation Father     No Known Allergies  Current Outpatient Prescriptions on File Prior to Visit  Medication Sig Dispense Refill  . acetaminophen (TYLENOL) 500 MG tablet Take 500 mg by mouth as needed.      Marland Kitchen amLODipine (NORVASC) 10 MG tablet Take 1 tablet (10 mg total) by mouth daily. 90 tablet 3  . diphenhydrAMINE (BENADRYL) 25 MG tablet  Take 25 mg by mouth as needed.      Marland Kitchen ibuprofen (ADVIL,MOTRIN) 200 MG tablet Take 200 mg by mouth 3 (three) times daily as needed.     Marland Kitchen lisinopril (PRINIVIL,ZESTRIL) 20 MG tablet Take 1 tablet (20 mg total) by mouth daily. 90 tablet 3   No current facility-administered medications on file prior to visit.    BP 120/70 mmHg  Pulse 92  Temp(Src) 97.7 F (36.5 C) (Oral)  Wt 182 lb 8 oz (82.781 kg)  SpO2 97%       Objective:   Physical Exam  Constitutional: He is oriented to person, place, and time. He appears well-developed and well-nourished.  HENT:  Right Ear: Tympanic membrane normal.  Left Ear: Tympanic membrane normal.  Nose: Rhinorrhea present. Right sinus exhibits no maxillary sinus tenderness and no frontal sinus tenderness. Left sinus exhibits no maxillary sinus tenderness and no frontal sinus tenderness.    Erythematous nasal membranes. Post nasal drip noted  Cardiovascular: Normal rate, regular rhythm and normal heart sounds.   Patient HR decreased from first reading. Patient stated that he had just finished a cigarette prior to stepping into office.  Pulmonary/Chest: Breath sounds normal. No respiratory distress. He has no wheezes. He has no rales.  Abdominal: Soft. Bowel sounds are normal.  Neurological: He is alert and oriented to person, place, and time.  Skin: Skin is warm and dry.          Assessment & Plan:  Viral URI with cough: Advised patient to use Mucinex DM for cough and Zyrtec and flonase for allergic rhinitis symptoms. Discussed the avoidance of antibiotics for viral infections.Encouraged supportive measures to increase fluids, rest, and hand hygiene when coughing.  Advised patient to contact clinic if symptoms of sinus pressure/pain occur or symptoms do not improve or worsen in 4 days, or if he develops a temp >101. Patient voiced understanding.

## 2015-09-11 ENCOUNTER — Telehealth: Payer: Self-pay | Admitting: Family Medicine

## 2015-09-11 MED ORDER — DOXYCYCLINE HYCLATE 100 MG PO TABS: 100.0000 mg | ORAL_TABLET | Freq: Two times a day (BID) | ORAL | Status: DC

## 2015-09-11 NOTE — Telephone Encounter (Signed)
Patient called again about the antibiotic.  He said he is in desperate need of it.

## 2015-09-11 NOTE — Telephone Encounter (Signed)
Patient saw the nurse practitioner Gregary Signs on Monday and he was told to call if he gets worse and they would call in an antibiotic.  He still have the same symptoms but have added a sore throat and chest congestion.  [facility] [address]

## 2015-09-11 NOTE — Telephone Encounter (Signed)
Dr. Hunter, please see message and advise. 

## 2015-09-11 NOTE — Telephone Encounter (Signed)
keba please call in doxycycline 100mg  BID #[id number]. If patient's symptoms worsen or he develops shortness of breath despite antibiotics- will need to be seen.

## 2015-09-11 NOTE — Telephone Encounter (Signed)
Antibiotic sent in and pt notified.

## 2015-11-11 DIAGNOSIS — S139XXA Sprain of joints and ligaments of unspecified parts of neck, initial encounter: Secondary | ICD-10-CM | POA: Diagnosis not present

## 2015-11-29 ENCOUNTER — Other Ambulatory Visit: Payer: Self-pay | Admitting: Family Medicine

## 2015-12-04 DIAGNOSIS — M5126 Other intervertebral disc displacement, lumbar region: Secondary | ICD-10-CM | POA: Diagnosis not present

## 2015-12-04 DIAGNOSIS — M5416 Radiculopathy, lumbar region: Secondary | ICD-10-CM | POA: Diagnosis not present

## 2015-12-09 DIAGNOSIS — M5416 Radiculopathy, lumbar region: Secondary | ICD-10-CM | POA: Diagnosis not present

## 2015-12-11 DIAGNOSIS — M5136 Other intervertebral disc degeneration, lumbar region: Secondary | ICD-10-CM | POA: Diagnosis not present

## 2015-12-11 DIAGNOSIS — M5126 Other intervertebral disc displacement, lumbar region: Secondary | ICD-10-CM | POA: Diagnosis not present

## 2015-12-11 DIAGNOSIS — M5416 Radiculopathy, lumbar region: Secondary | ICD-10-CM | POA: Diagnosis not present

## 2015-12-13 DIAGNOSIS — M1711 Unilateral primary osteoarthritis, right knee: Secondary | ICD-10-CM | POA: Diagnosis not present

## 2015-12-18 DIAGNOSIS — M5416 Radiculopathy, lumbar region: Secondary | ICD-10-CM | POA: Diagnosis not present

## 2015-12-25 DIAGNOSIS — Z01812 Encounter for preprocedural laboratory examination: Secondary | ICD-10-CM | POA: Diagnosis not present

## 2015-12-25 DIAGNOSIS — M5416 Radiculopathy, lumbar region: Secondary | ICD-10-CM | POA: Diagnosis not present

## 2015-12-31 DIAGNOSIS — M1711 Unilateral primary osteoarthritis, right knee: Secondary | ICD-10-CM | POA: Diagnosis not present

## 2016-01-01 DIAGNOSIS — M4807 Spinal stenosis, lumbosacral region: Secondary | ICD-10-CM | POA: Diagnosis not present

## 2016-01-01 DIAGNOSIS — M5127 Other intervertebral disc displacement, lumbosacral region: Secondary | ICD-10-CM | POA: Diagnosis not present

## 2016-01-01 DIAGNOSIS — M21371 Foot drop, right foot: Secondary | ICD-10-CM | POA: Diagnosis not present

## 2016-01-01 DIAGNOSIS — M4806 Spinal stenosis, lumbar region: Secondary | ICD-10-CM | POA: Diagnosis not present

## 2016-01-01 DIAGNOSIS — M4727 Other spondylosis with radiculopathy, lumbosacral region: Secondary | ICD-10-CM | POA: Diagnosis not present

## 2016-01-10 DIAGNOSIS — M1711 Unilateral primary osteoarthritis, right knee: Secondary | ICD-10-CM | POA: Diagnosis not present

## 2016-01-17 DIAGNOSIS — M1711 Unilateral primary osteoarthritis, right knee: Secondary | ICD-10-CM | POA: Diagnosis not present

## 2016-04-01 ENCOUNTER — Encounter: Payer: Self-pay | Admitting: Family Medicine

## 2016-04-01 ENCOUNTER — Ambulatory Visit (INDEPENDENT_AMBULATORY_CARE_PROVIDER_SITE_OTHER): Payer: Medicare Other | Admitting: Family Medicine

## 2016-04-01 VITALS — BP 130/84 | Wt 176.5 lb

## 2016-04-01 DIAGNOSIS — L989 Disorder of the skin and subcutaneous tissue, unspecified: Secondary | ICD-10-CM

## 2016-04-01 DIAGNOSIS — L237 Allergic contact dermatitis due to plants, except food: Secondary | ICD-10-CM | POA: Diagnosis not present

## 2016-04-01 MED ORDER — TRIAMCINOLONE ACETONIDE 0.1 % EX CREA: 1.0000 "application " | TOPICAL_CREAM | Freq: Two times a day (BID) | CUTANEOUS | 0 refills | Status: DC

## 2016-04-01 NOTE — Patient Instructions (Addendum)
Steroid cream for 7-10 days  We will call you within a week about your referral to dermatology. If you do not hear within 2 weeks, give [place] a call.     Poison [employer] ivy is a inflammation of the skin (contact dermatitis) caused by touching the allergens on the leaves of the ivy plant following previous exposure to the plant. The rash usually appears 48 hours after exposure. The rash is usually bumps (papules) or blisters (vesicles) in a linear pattern. Depending on your own sensitivity, the rash may simply cause redness and itching, or it may also progress to blisters which may break open. These must be well cared for to prevent secondary bacterial (germ) infection, followed by scarring. Keep any open areas dry, clean, dressed, and covered with an antibacterial ointment if needed. The eyes may also get puffy. The puffiness is worst in the morning and gets better as the day progresses. This dermatitis usually heals without scarring, within 2 to 3 weeks without treatment. HOME CARE INSTRUCTIONS  Thoroughly wash with soap and water as soon as you have been exposed to poison ivy. You have about one half hour to remove the plant resin before it will cause the rash. This washing will destroy the oil or antigen on the skin that is causing, or will cause, the rash. Be sure to wash under your fingernails as any plant resin there will continue to spread the rash. Do not rub skin vigorously when washing affected area. Poison ivy cannot spread if no oil from the plant remains on your body. A rash that has progressed to weeping sores will not spread the rash unless you have not washed thoroughly. It is also important to wash any clothes you have been wearing as these may carry active allergens. The rash will return if you wear the unwashed clothing, even several days later. Avoidance of the plant in the future is the best measure. Poison ivy plant can be recognized by the number of leaves. Generally, poison ivy has  three leaves with flowering branches on a single stem. Diphenhydramine may be purchased over the counter and used as needed for itching. Do not drive with this medication if it makes you drowsy.Ask your caregiver about medication for children. SEEK MEDICAL CARE IF:  Open sores develop.  Redness spreads beyond area of rash.  You notice purulent (pus-like) discharge.  You have increased pain.  Other signs of infection develop (such as fever).   This information is not intended to replace advice given to you by your health care provider. Make sure you discuss any questions you have with your health care provider.   Document Released: 07/31/2000 Document Revised: 10/26/2011 Document Reviewed: 01/09/2015 Elsevier Interactive Patient Education [employer].

## 2016-04-01 NOTE — Progress Notes (Signed)
Subjective:  [name] White. is a 50 y.o. year old very pleasant male patient who presents for/with See problem oriented charting ROS- see any ROS included in HPI as well.   Past Medical History-  Patient Active Problem List   Diagnosis Date Noted  . SVT/ PSVT/ PAT 06/16/2010    Priority: High  . Hyperglycemia 05/27/2012    Priority: Medium  . SCOLIOSIS, THORACOLUMBAR 01/06/2010    Priority: Medium  . Hyperlipidemia 04/14/2007    Priority: Medium  . Essential hypertension 03/07/2007    Priority: Medium  . Osteoarthritis of right knee 12/04/2014    Priority: Low  . Former smoker 12/04/2014    Priority: Low    Medications- reviewed and updated Current Outpatient Prescriptions  Medication Sig Dispense Refill  . acetaminophen (TYLENOL) 500 MG tablet Take 500 mg by mouth as needed.      Marland Kitchen amLODipine (NORVASC) 10 MG tablet TAKE 1 TABLET EVERY DAY 90 tablet 1  . diphenhydrAMINE (BENADRYL) 25 MG tablet Take 25 mg by mouth as needed.      Marland Kitchen ibuprofen (ADVIL,MOTRIN) 200 MG tablet Take 200 mg by mouth 3 (three) times daily as needed.     Marland Kitchen lisinopril (PRINIVIL,ZESTRIL) 20 MG tablet TAKE 1 TABLET (20 MG TOTAL) BY MOUTH DAILY. 90 tablet 1  . meloxicam (MOBIC) 15 MG tablet      No current facility-administered medications for this visit.     Objective: BP 130/84   Wt 176 lb 8 oz (80.1 kg)   BMI 24.62 kg/m  Gen: NAD, resting comfortably CV: RRR no murmurs rubs or gallops Lungs: CTAB no crackles, wheeze, rhonchi Ext: no edema Skin: warm, dry, excoriated area left wrist palmar sides- small vesicles notes- some smaller erythematous papules on left and right hand and onto arms Right groin 2 x 2 cm brown lesion circular Neuro: grossly normal, moves all extremities, antalgic gait  Assessment/Plan:  Rash S: several days ago was working in his yard and got into some poison ivy. Got onto left wrist as well as some small spots on right arm. Very itchy. Tried benadryl and alcohol- both help  some. Seems to not be improving ROS-not ill appearing, no fever/chills. No new medications. Not immunocompromised. No mucus membrane involvement.  A/P: triamcinolone to be used BID 7-10 days. Given most of rash on arms- reasonable to apply to full area. Could consider prednisone later but patient at risk for diabetes so want to avoid if possible  Skin lesion - Plan: Ambulatory referral to Dermatology S: lesion in left groin that has been growing now probably 2 x 2 cm A/P: refer to derm for further evaluation  Asked him to schedule a regular follow up visit with me for chronic medical conditions and to update bloodwork  Orders Placed This Encounter  Procedures  . Ambulatory referral to Dermatology    Referral Priority:   Routine    Referral Type:   Consultation    Referral Reason:   Specialty Services Required    Requested Specialty:   Dermatology    Number of Visits Requested:   1    Meds ordered this encounter  Medications  . triamcinolone cream (KENALOG) 0.1 %    Sig: Apply 1 application topically 2 (two) times daily.    Dispense:  80 g    Refill:  0    Return precautions advised.  Garret Reddish, MD

## 2016-04-16 DIAGNOSIS — M5416 Radiculopathy, lumbar region: Secondary | ICD-10-CM | POA: Diagnosis not present

## 2016-04-16 DIAGNOSIS — Z6825 Body mass index (BMI) 25.0-25.9, adult: Secondary | ICD-10-CM | POA: Diagnosis not present

## 2016-05-05 DIAGNOSIS — L820 Inflamed seborrheic keratosis: Secondary | ICD-10-CM | POA: Diagnosis not present

## 2016-05-05 DIAGNOSIS — L814 Other melanin hyperpigmentation: Secondary | ICD-10-CM | POA: Diagnosis not present

## 2016-06-01 ENCOUNTER — Other Ambulatory Visit: Payer: Self-pay | Admitting: Family Medicine

## 2016-06-04 DIAGNOSIS — L820 Inflamed seborrheic keratosis: Secondary | ICD-10-CM | POA: Diagnosis not present

## 2016-07-02 DIAGNOSIS — A630 Anogenital (venereal) warts: Secondary | ICD-10-CM | POA: Diagnosis not present

## 2016-07-02 DIAGNOSIS — B079 Viral wart, unspecified: Secondary | ICD-10-CM | POA: Diagnosis not present

## 2016-07-06 ENCOUNTER — Encounter: Payer: Self-pay | Admitting: Family Medicine

## 2016-07-06 ENCOUNTER — Ambulatory Visit (INDEPENDENT_AMBULATORY_CARE_PROVIDER_SITE_OTHER): Payer: Medicare Other | Admitting: Family Medicine

## 2016-07-06 VITALS — BP 130/76 | HR 95 | Temp 97.8°F | Ht 71.0 in | Wt 167.2 lb

## 2016-07-06 DIAGNOSIS — I10 Essential (primary) hypertension: Secondary | ICD-10-CM

## 2016-07-06 DIAGNOSIS — A630 Anogenital (venereal) warts: Secondary | ICD-10-CM | POA: Diagnosis not present

## 2016-07-06 DIAGNOSIS — Z1211 Encounter for screening for malignant neoplasm of colon: Secondary | ICD-10-CM

## 2016-07-06 DIAGNOSIS — Z125 Encounter for screening for malignant neoplasm of prostate: Secondary | ICD-10-CM

## 2016-07-06 DIAGNOSIS — R739 Hyperglycemia, unspecified: Secondary | ICD-10-CM | POA: Diagnosis not present

## 2016-07-06 DIAGNOSIS — E785 Hyperlipidemia, unspecified: Secondary | ICD-10-CM | POA: Diagnosis not present

## 2016-07-06 DIAGNOSIS — Z202 Contact with and (suspected) exposure to infections with a predominantly sexual mode of transmission: Secondary | ICD-10-CM

## 2016-07-06 DIAGNOSIS — Z7251 High risk heterosexual behavior: Secondary | ICD-10-CM

## 2016-07-06 DIAGNOSIS — Z7289 Other problems related to lifestyle: Secondary | ICD-10-CM

## 2016-07-06 NOTE — Patient Instructions (Addendum)
Schedule a lab visit at the check out desk for a few days before next visit with me. Return for future fasting labs meaning nothing but water after midnight please. Ok to take your medications with water.   STD screening included except HIV which was done last year  We will call you within a week about your referral to GI for colonoscopy. If you do not hear within 2 weeks, give [place] a call.

## 2016-07-06 NOTE — Progress Notes (Signed)
Subjective:  [name] White. is a 50 y.o. year old very pleasant male patient who presents for/with See problem oriented charting ROS- growths on right side of penis and in left groin- several of thse s/p cryotherapy and improving. Admits to anxiety related to warts.see any ROS included in HPI as well.   Past Medical History-  Patient Active Problem List   Diagnosis Date Noted  . SVT/ PSVT/ PAT 06/16/2010    Priority: High  . Hyperglycemia 05/27/2012    Priority: Medium  . SCOLIOSIS, THORACOLUMBAR 01/06/2010    Priority: Medium  . Hyperlipidemia 04/14/2007    Priority: Medium  . Essential hypertension 03/07/2007    Priority: Medium  . Osteoarthritis of right knee 12/04/2014    Priority: Low  . Former smoker 12/04/2014    Priority: Low  . Genital warts 07/06/2016    Medications- reviewed and updated Current Outpatient Prescriptions  Medication Sig Dispense Refill  . acetaminophen (TYLENOL) 500 MG tablet Take 500 mg by mouth as needed.      Marland Kitchen amLODipine (NORVASC) 10 MG tablet TAKE 1 TABLET BY MOUTH EVERY DAY 90 tablet 1  . diphenhydrAMINE (BENADRYL) 25 MG tablet Take 25 mg by mouth as needed.      Marland Kitchen ibuprofen (ADVIL,MOTRIN) 200 MG tablet Take 200 mg by mouth 3 (three) times daily as needed.     . imiquimod (ALDARA) 5 % cream Apply topically 3 (three) times a week.    Marland Kitchen lisinopril (PRINIVIL,ZESTRIL) 20 MG tablet TAKE 1 TABLET BY MOUTH EVERY DAY 90 tablet 1  . meloxicam (MOBIC) 15 MG tablet     . triamcinolone cream (KENALOG) 0.1 % Apply 1 application topically 2 (two) times daily. 80 g 0   No current facility-administered medications for this visit.     Objective: BP 130/76 (BP Location: Left Arm, Patient Position: Sitting, Cuff Size: Large)   Pulse 95   Temp 97.8 F (36.6 C) (Oral)   Ht 5\' 11"  (1.803 m)   Wt 167 lb 3.2 oz (75.8 kg)   SpO2 97%   BMI 23.32 kg/m  Gen: NAD, resting comfortably GU: healing wounds after cryotherapy in groin- several warts untreated  remain   Assessment/Plan:  Genital warts S: treated by dermatology with cryotherapy as well as well as aldara. He wanted my perspective on disease A/P: extended counseling provided today on transmission risks, how this could have been obtained, fact that HPV remains present even after treatment of warts, risks related to cervical cancer. We also discussed protected sex if has sex but will not guarantee lack of transmission. He would also like to be screened for RPR. Gonorrhea/chlamydia/trichomas. HIV last year- negative and no partners in 2 years.   The duration of face-to-face time during this visit was greater than 20 minutes. Greater than 50% of this time was spent in counseling about genital warts as above  Also wants to do fasting labs before next visit Orders Placed This Encounter  Procedures  . CBC    [place]  . Comprehensive metabolic panel    [place]    Order Specific Question:   Has the patient fasted?    Answer:   No  . Lipid panel    [place]    Order Specific Question:   Has the patient fasted?    Answer:   No  . PSA  . Hemoglobin A1c    [place]  . RPR    [facility]  . Hepatitis C antibody, reflex    [facility]  Standing Status:   Future    Standing Expiration Date:   07/06/2017  . Ambulatory referral to Gastroenterology    Referral Priority:   Routine    Referral Type:   Consultation    Referral Reason:   Specialty Services Required    Number of Visits Requested:   1  . POCT Urinalysis Dipstick (Automated)    Meds ordered this encounter  Medications  . imiquimod (ALDARA) 5 % cream    Sig: Apply topically 3 (three) times a week.    Return precautions advised.  Garret Reddish, MD

## 2016-07-06 NOTE — Assessment & Plan Note (Signed)
S: treated by dermatology with cryotherapy as well as well as aldara. He wanted my perspective on disease A/P: extended counseling provided today on transmission risks, how this could have been obtained, fact that HPV remains present even after treatment of warts, risks related to cervical cancer. We also discussed protected sex if has sex but will not guarantee lack of transmission. He would also like to be screened for RPR. Gonorrhea/chlamydia/trichomas. HIV last year- negative and no partners in 2 years.

## 2016-07-06 NOTE — Progress Notes (Signed)
Pre visit review using our clinic review tool, if applicable. No additional management support is needed unless otherwise documented below in the visit note. 

## 2016-07-07 ENCOUNTER — Encounter: Payer: Self-pay | Admitting: Gastroenterology

## 2016-07-10 DIAGNOSIS — H6122 Impacted cerumen, left ear: Secondary | ICD-10-CM | POA: Diagnosis not present

## 2016-07-13 ENCOUNTER — Ambulatory Visit (INDEPENDENT_AMBULATORY_CARE_PROVIDER_SITE_OTHER)
Admission: RE | Admit: 2016-07-13 | Discharge: 2016-07-13 | Disposition: A | Discharge status: Home / Self Care | Payer: Medicare Other | Source: Ambulatory Visit | Attending: Family Medicine | Admitting: Family Medicine

## 2016-07-13 ENCOUNTER — Other Ambulatory Visit (INDEPENDENT_AMBULATORY_CARE_PROVIDER_SITE_OTHER): Payer: Medicare Other

## 2016-07-13 ENCOUNTER — Other Ambulatory Visit: Payer: Self-pay | Admitting: Family Medicine

## 2016-07-13 ENCOUNTER — Encounter: Payer: Self-pay | Admitting: Family Medicine

## 2016-07-13 ENCOUNTER — Ambulatory Visit (INDEPENDENT_AMBULATORY_CARE_PROVIDER_SITE_OTHER): Payer: Medicare Other | Admitting: Family Medicine

## 2016-07-13 ENCOUNTER — Telehealth: Payer: Self-pay | Admitting: Family Medicine

## 2016-07-13 ENCOUNTER — Other Ambulatory Visit (HOSPITAL_COMMUNITY)
Admission: RE | Admit: 2016-07-13 | Discharge: 2016-07-13 | Disposition: A | Discharge status: Home / Self Care | Payer: Medicare Other | Source: Ambulatory Visit | Attending: Family Medicine | Admitting: Family Medicine

## 2016-07-13 VITALS — BP 138/86 | HR 85 | Temp 98.4°F | Wt 169.4 lb

## 2016-07-13 DIAGNOSIS — S4991XA Unspecified injury of right shoulder and upper arm, initial encounter: Secondary | ICD-10-CM

## 2016-07-13 DIAGNOSIS — I10 Essential (primary) hypertension: Secondary | ICD-10-CM

## 2016-07-13 DIAGNOSIS — R319 Hematuria, unspecified: Secondary | ICD-10-CM

## 2016-07-13 DIAGNOSIS — E785 Hyperlipidemia, unspecified: Secondary | ICD-10-CM

## 2016-07-13 DIAGNOSIS — Z7251 High risk heterosexual behavior: Secondary | ICD-10-CM

## 2016-07-13 DIAGNOSIS — R739 Hyperglycemia, unspecified: Secondary | ICD-10-CM | POA: Diagnosis not present

## 2016-07-13 DIAGNOSIS — Z7289 Other problems related to lifestyle: Secondary | ICD-10-CM

## 2016-07-13 DIAGNOSIS — A630 Anogenital (venereal) warts: Secondary | ICD-10-CM

## 2016-07-13 DIAGNOSIS — M25511 Pain in right shoulder: Secondary | ICD-10-CM | POA: Diagnosis not present

## 2016-07-13 DIAGNOSIS — Z125 Encounter for screening for malignant neoplasm of prostate: Secondary | ICD-10-CM | POA: Diagnosis not present

## 2016-07-13 DIAGNOSIS — Z113 Encounter for screening for infections with a predominantly sexual mode of transmission: Secondary | ICD-10-CM | POA: Insufficient documentation

## 2016-07-13 DIAGNOSIS — Z202 Contact with and (suspected) exposure to infections with a predominantly sexual mode of transmission: Secondary | ICD-10-CM | POA: Diagnosis not present

## 2016-07-13 LAB — COMPREHENSIVE METABOLIC PANEL
ALT: 23 U/L (ref 0–53)
AST: 22 U/L (ref 0–37)
Albumin: 4.2 g/dL (ref 3.5–5.2)
Alkaline Phosphatase: 51 U/L (ref 39–117)
BUN: 11 mg/dL (ref 6–23)
CO2: 32 mEq/L (ref 19–32)
Calcium: 9.4 mg/dL (ref 8.4–10.5)
Chloride: 100 mEq/L (ref 96–112)
Creatinine, Ser: 0.77 mg/dL (ref 0.40–1.50)
GFR: 112.38 mL/min (ref >60.00)
Glucose, Bld: 111 mg/dL — ABNORMAL HIGH (ref 70–99)
Potassium: 4.2 mEq/L (ref 3.5–5.1)
Sodium: 138 mEq/L (ref 135–145)
Total Bilirubin: 1.2 mg/dL (ref 0.2–1.2)
Total Protein: 6.8 g/dL (ref 6.0–8.3)

## 2016-07-13 LAB — POC URINALSYSI DIPSTICK (AUTOMATED)
Bilirubin, UA: NEGATIVE
Glucose, UA: NEGATIVE
Ketones, UA: NEGATIVE
Leukocytes, UA: NEGATIVE
Nitrite, UA: NEGATIVE
Protein, UA: NEGATIVE
Spec Grav, UA: 1.015
Urobilinogen, UA: 0.2
pH, UA: 7.5

## 2016-07-13 LAB — LIPID PANEL
Cholesterol: 161 mg/dL (ref 0–200)
HDL: 56.4 mg/dL (ref >39.00)
LDL Cholesterol: 81 mg/dL (ref 0–99)
NonHDL: 104.75
Total CHOL/HDL Ratio: 3
Triglycerides: 121 mg/dL (ref 0.0–149.0)
VLDL: 24.2 mg/dL (ref 0.0–40.0)

## 2016-07-13 LAB — CBC
HCT: 45.2 % (ref 39.0–52.0)
Hemoglobin: 15.6 g/dL (ref 13.0–17.0)
MCHC: 34.5 g/dL (ref 30.0–36.0)
MCV: 90.6 fl (ref 78.0–100.0)
Platelets: 261 10*3/uL (ref 150.0–400.0)
RBC: 4.99 Mil/uL (ref 4.22–5.81)
RDW: 13.8 % (ref 11.5–15.5)
WBC: 5.1 10*3/uL (ref 4.0–10.5)

## 2016-07-13 LAB — URINALYSIS, MICROSCOPIC ONLY

## 2016-07-13 LAB — PSA: PSA: 2.57 ng/mL (ref 0.10–4.00)

## 2016-07-13 LAB — HEMOGLOBIN A1C: Hgb A1c MFr Bld: 5.4 % (ref 4.6–6.5)

## 2016-07-13 NOTE — Progress Notes (Signed)
Pre visit review using our clinic review tool, if applicable. No additional management support is needed unless otherwise documented below in the visit note. 

## 2016-07-13 NOTE — Telephone Encounter (Signed)
Pt would like have his xray results if they are back

## 2016-07-13 NOTE — Patient Instructions (Signed)
Please go to [place] at [facility] for the X-ray of your shoulder. You may continue with Tylenol as needed and results will be called to you and further treatment will be discussed as needed.

## 2016-07-13 NOTE — Progress Notes (Signed)
Subjective:    Patient ID: [name] Ramm., male    DOB: August 12, 1966, 50 y.o.   MRN: [id number]  HPI  [name] White is a 50 year old male who presents today with right shoulder pain due to a mechanical fall after tripping on a curb and falling onto concrete 2 days ago.  Denies hitting his head, LOC, or neuro/cardio prodrome prior to fall. Pain is noted as an 8 and described as deep and aching.  Pain is aggravated with movement and is improved with sitting up.  He denies radiation of pain, numbness, weakness,or tingling present. He denies fever, chills, sweats, chest pain, palpitations, N/V, diaphoresis, headaches, or dizziness.  Tylenol 650 mg has provided moderate benefit.   Review of Systems  Constitutional: Negative for chills, fatigue and fever.  Respiratory: Negative for cough, shortness of breath and wheezing.   Cardiovascular: Negative for chest pain and palpitations.  Gastrointestinal: Negative for abdominal pain, constipation, diarrhea, nausea and vomiting.  Musculoskeletal: Negative for joint swelling, myalgias and neck stiffness.       Right Shoulder Pain  Skin: Negative for rash.  Neurological: Negative for dizziness, weakness, light-headedness, numbness and headaches.   Past Medical History:  Diagnosis Date  . AF (atrial fibrillation) (HCC)    nonsustained  . Blood transfusion without reported diagnosis    1960's  . Cardiomyopathy secondary   . Chronic pain syndrome   . Hyperlipidemia   . Hypertension   . Nephrolithiasis   . PSVT (paroxysmal supraventricular tachycardia) (HCC)      Social History   Social History  . Marital status: Legally Separated    Spouse name: N/A  . Number of children: N/A  . Years of education: N/A   Occupational History  . Not on file.   Social History Main Topics  . Smoking status: Former Smoker    Packs/day: 0.50    Years: 20.00    Types: Cigarettes    Quit date: 08/18/1991  . Smokeless tobacco: Never Used  . Alcohol use 0.0 oz/week      Comment: occ  . 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 Sexual activity: Not on file   Other Topics Concern  . Not on file   Social History Narrative   Single, divorced. 2 daughters. No grandkids.       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 Out 2001.           Past Surgical History:  Procedure Laterality Date  . AV NODE ABLATION     09/16/07. SVT Dr. Caryl Comes  . BACK SURGERY     01/12/00 and 1982  . INGUINAL HERNIA REPAIR     L  . REPLACEMENT TOTAL KNEE     1999, L. Murphy/wainer.     Family History  Problem Relation Age of Onset  . Breast cancer Mother   . Diabetes Father   . Heart attack Father     late 85s  . Atrial fibrillation Father     No Known Allergies  Current Outpatient Prescriptions on File Prior to Visit  Medication Sig Dispense Refill  . acetaminophen (TYLENOL) 500 MG tablet Take 500 mg by mouth as needed.      Marland Kitchen amLODipine (NORVASC) 10 MG tablet TAKE 1 TABLET BY MOUTH EVERY DAY 90 tablet 1  . diphenhydrAMINE (BENADRYL) 25 MG tablet Take 25 mg by mouth as needed.      Marland Kitchen ibuprofen (ADVIL,MOTRIN) 200 MG tablet  Take 200 mg by mouth 3 (three) times daily as needed.     . imiquimod (ALDARA) 5 % cream Apply topically 3 (three) times a week.    Marland Kitchen lisinopril (PRINIVIL,ZESTRIL) 20 MG tablet TAKE 1 TABLET BY MOUTH EVERY DAY 90 tablet 1  . meloxicam (MOBIC) 15 MG tablet     . triamcinolone cream (KENALOG) 0.1 % Apply 1 application topically 2 (two) times daily. 80 g 0   No current facility-administered medications on file prior to visit.     BP 138/86 (BP Location: Left Arm, Patient Position: Sitting, Cuff Size: Normal)   Pulse 85   Temp 98.4 F (36.9 C) (Oral)   Wt 169 lb 6.4 oz (76.8 kg)   SpO2 97%   BMI 23.63 kg/m       Objective:   Physical Exam  Constitutional: He is oriented to person, place, and time. He appears well-developed and well-nourished.  Eyes: Pupils are equal, round, and reactive to light. No scleral  icterus.  Neck: Neck supple.  Cardiovascular: Normal rate and regular rhythm.   Pulmonary/Chest: Effort normal and breath sounds normal. He has no wheezes. He has no rales.  Musculoskeletal:  Severe scoliosis present. Patient is followed by orthopedics for this concern. Minor discomfort of shoulder with Hawkins-Kennedy test. Minor discomfort with passive, painful arc maneuver. Overhead reaching causes discomfort that is reported as "deep" by patient. No drop arm sign. No weakness present. Pain is elicited with direct pressure and active reaching. ROM is full but discomfort is present with active and passive ROM. Negative empty can test  Lymphadenopathy:    He has no cervical adenopathy.  Neurological: He is alert and oriented to person, place, and time. Coordination normal.  Skin: Skin is warm and dry. No rash noted.      Assessment & Plan:  1. Injury of right shoulder, initial encounter Tenderness with palpation and pain with ROM; will obtain an X-ray to rule out fracture. We discussed potential concerns of rotator cuff tear and tendon tear. Also advised patient that if symptoms persist and X-ray is negative for fracture, follow up with PCP or his orthopedic provider as further imaging such as a MRI may be indicated. Patient will continue Tylenol for discomfort.  - DG Shoulder Right; Future  Delano Metz, FNP-C

## 2016-07-14 LAB — RPR

## 2016-07-14 LAB — HEPATITIS C ANTIBODY: HCV Ab: NEGATIVE

## 2016-07-14 NOTE — Telephone Encounter (Signed)
Spoke with patient who states someone called him this morning and gave him the results of his xray.

## 2016-07-15 LAB — URINE CYTOLOGY ANCILLARY ONLY
Chlamydia: NEGATIVE
Neisseria Gonorrhea: NEGATIVE
Trichomonas: NEGATIVE

## 2016-07-20 ENCOUNTER — Ambulatory Visit (INDEPENDENT_AMBULATORY_CARE_PROVIDER_SITE_OTHER): Payer: Medicare Other | Admitting: Family Medicine

## 2016-07-20 ENCOUNTER — Encounter: Payer: Self-pay | Admitting: Family Medicine

## 2016-07-20 VITALS — BP 110/72 | HR 96 | Temp 98.3°F | Ht 67.0 in | Wt 164.6 lb

## 2016-07-20 DIAGNOSIS — Z0000 Encounter for general adult medical examination without abnormal findings: Secondary | ICD-10-CM | POA: Diagnosis not present

## 2016-07-20 NOTE — Progress Notes (Signed)
Phone: [phone]  Subjective:  Patient presents today for their annual wellness visit  Preventive Screening-Counseling & Management  Smoking Status: former Smoker quit 1991-11-22, 10 pack years Second Hand Smoking status: No smokers in home  Risk Factors Regular exercise: none due to chronic pain and scoliosis Diet: appetite comes and goes- weight down some, not really trying to lose Fall Risk: yes,  Knee buckled on him going up on a curb and fell 2 saturdays ago and then this past Friday- both fridays fell onto right shoulder. Twice as painful now. Discussed knee replacement may help considering falls caused by buckling  Cardiac risk factors:  male Hyperlipidemia - controlled without meds No diabetes. some hyperglycemia Family History: dad with heart disease(40s)  and grandfathers died of young age November 22, 2047, 58)  Depression Screen None. PHQ2 0   Activities of Daily Living Independent ADLs and IADLs   Hearing Difficulties: patient declines  Cognitive Testing No reported trouble.   Normal 3 word recall  List the Names of Other Physician/Practitioners you currently use: -Dr. Caryl Comes cardiology -Dr. Oneita Kras sports medicine. Dr. Noemi Chapel would replace joint - Dr. Allyson Sabal - genital warts  Immunization History  Administered Date(s) Administered  . Influenza Split 06/19/2011  . Influenza Whole 05/20/2009, 06/16/2010  . Influenza,inj,Quad PF,36+ Mos 08/01/2013, 07/03/2014, 06/21/2015  . Influenza-Unspecified 06/22/2016  . Pneumococcal Conjugate-13 07/03/2014  . Td 01/06/2010   Required Immunizations needed today none  Screening tests- up to date Health Maintenance Due  Topic Date Due  . COLONOSCOPY - set up January 23rd 08/26/2013   ROS- right shoulder pain after recent fall. No chest pain or shortness of breath. No fecal or urinary incontinence  The following were reviewed and entered/updated in epic: Past Medical History:  Diagnosis Date  . AF (atrial fibrillation)  (HCC)    nonsustained  . Blood transfusion without reported diagnosis    1960's  . Cardiomyopathy secondary   . Chronic pain syndrome   . Hyperlipidemia   . Hypertension   . Nephrolithiasis   . PSVT (paroxysmal supraventricular tachycardia) [facility])    Patient Active Problem List   Diagnosis Date Noted  . SVT/ PSVT/ PAT 06/16/2010    Priority: High  . Genital warts 07/06/2016    Priority: Medium  . Hyperglycemia 05/27/2012    Priority: Medium  . SCOLIOSIS, THORACOLUMBAR 01/06/2010    Priority: Medium  . Hyperlipidemia 04/14/2007    Priority: Medium  . Essential hypertension 03/07/2007    Priority: Medium  . Osteoarthritis of right knee 12/04/2014    Priority: Low  . Former smoker 12/04/2014    Priority: Low   Past Surgical History:  Procedure Laterality Date  . AV NODE ABLATION     09/16/07. SVT Dr. Caryl Comes  . BACK SURGERY     01/12/00 and 11-21-1980  . INGUINAL HERNIA REPAIR     L  . REPLACEMENT TOTAL KNEE     1999, L. Murphy/wainer.     Family History  Problem Relation Age of Onset  . Breast cancer Mother   . Diabetes Father   . Heart attack Father     late 66s  . Atrial fibrillation Father     Medications- reviewed and updated Current Outpatient Prescriptions  Medication Sig Dispense Refill  . acetaminophen (TYLENOL) 500 MG tablet Take 500 mg by mouth as needed.      Marland Kitchen amLODipine (NORVASC) 10 MG tablet TAKE 1 TABLET BY MOUTH EVERY DAY 90 tablet 1  . diphenhydrAMINE (BENADRYL) 25 MG tablet Take 25 mg  by mouth as needed.      Marland Kitchen ibuprofen (ADVIL,MOTRIN) 200 MG tablet Take 200 mg by mouth 3 (three) times daily as needed.     . imiquimod (ALDARA) 5 % cream Apply topically 3 (three) times a week.    Marland Kitchen lisinopril (PRINIVIL,ZESTRIL) 20 MG tablet TAKE 1 TABLET BY MOUTH EVERY DAY 90 tablet 1  . meloxicam (MOBIC) 15 MG tablet Daily for new     . triamcinolone cream (KENALOG) 0.1 % Apply 1 application topically 2 (two) times daily. 80 g 0   Allergies-reviewed and updated No  Known Allergies  Social History   Social History  . Marital status: Legally Separated    Spouse name: N/A  . Number of children: N/A  . Years of education: N/A   Social History Main Topics  . Smoking status: Former Smoker    Packs/day: 0.50    Years: 20.00    Types: Cigarettes    Quit date: 08/18/1991  . Smokeless tobacco: Never Used  . Alcohol use 0.0 oz/week     Comment: occ  . 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 Sexual activity: Not Asked   Other Topics Concern  . None   Social History Narrative   Single, divorced. 2 daughters. No grandkids.       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 Out 2001.           Objective: BP 110/72 (BP Location: Left Arm, Patient Position: Sitting, Cuff Size: Normal)   Pulse 96   Temp 98.3 F (36.8 C) (Oral)   Ht 5\' 7"  (1.702 m)   Wt 164 lb 9.6 oz (74.7 kg)   SpO2 98%   BMI 25.78 kg/m  Gen: NAD, resting comfortably HEENT: Mucous membranes are moist. Oropharynx normal Neck: no thyromegaly CV: RRR no murmurs rubs or gallops Lungs: CTAB no crackles, wheeze, rhonchi Severe scoliosis noted Abdomen: soft/nontender/nondistended/normal bowel sounds. No rebound or guarding.  Ext: no edema Skin: warm, dry Neuro: grossly normal, moves all extremities, PERRLA  Assessment/Plan:  AWV completed- discussed recommended screenings anddocumented any personalized health advice and referrals for preventive counseling. See AVS as well which was given to patient.   Status of chronic or acute concerns   Follows with Dr. Caryl Comes with SVT history. Sugar high but a1c not elevated. LDL <100 and does not want to push for <70 despite family history due to increased diabetes risk.  HTN- controlled on amlodipine 10mg  and lisinopril 20mg . Right shoulder pain to see orthopedics. Genital warts- coping with this better.   1 year AWV or prn  Return precautions advised.  Garret Reddish, MD

## 2016-07-20 NOTE — Progress Notes (Signed)
Pre visit review using our clinic review tool, if applicable. No additional management support is needed unless otherwise documented below in the visit note. 

## 2016-07-20 NOTE — Patient Instructions (Signed)
  [name] White , Thank you for taking time to come for your Medicare Wellness Visit. I appreciate your ongoing commitment to your health goals. Please review the following plan we discussed and let me know if I can assist you in the future.   These are the goals we discussed: 1. Exercise as able 2.  Follow up with murphy/wainer if shoulder not improving 3. Overall labs look ok- some increased risk of diabetes. Cholesterol ok- opted not to be more aggressive at this time  This is a list of the screening recommended for you and due dates:  Health Maintenance  Topic Date Due  . Colon Cancer Screening  08/26/2013  . Tetanus Vaccine  01/07/2020  . Flu Shot  Completed  .  Hepatitis C: One time screening is recommended by Center for Disease Control  (CDC) for  adults born from [birth year] through 1967.   Completed  . HIV Screening  Completed

## 2016-07-30 DIAGNOSIS — A630 Anogenital (venereal) warts: Secondary | ICD-10-CM | POA: Diagnosis not present

## 2016-08-06 ENCOUNTER — Telehealth: Payer: Self-pay | Admitting: Family Medicine

## 2016-08-06 NOTE — Telephone Encounter (Signed)
Pt states he received automated call for his annual wellness visit. Pt states he had CPE on 12/04, and wants to know if Dr Yong Channel thinks this is absolutely necessary.  He would rather not if he doesn't have to.

## 2016-08-06 NOTE — Telephone Encounter (Signed)
Called and spoke with patient and apologized for the confusion. I informed him the call was in error and that he had completed his AWV already. Patient verbalized understanding.

## 2016-08-06 NOTE — Telephone Encounter (Signed)
[name] Bo- will still need your help to figure out why this happened (if it came from [place])

## 2016-08-06 NOTE — Telephone Encounter (Signed)
Call was in error. Calls should not go out to patients that already had AWV (he had AWV on 07/20/16)  Roselyn Reef- please call patient.   Rachel Bo- what can we do to prevent calls to patients that have had these visits?

## 2016-08-12 NOTE — Telephone Encounter (Signed)
Any word on this Dustin?

## 2016-08-13 NOTE — Telephone Encounter (Signed)
The Call the patient is referring to is the [name] call that went out prior to the holidays to get individuals in for their AWV bufor the new year. The Patient was coded for an AWV, which means the [name] call was basing its call list off old data,and therefore a mistake. The [name] calls were a one time thing that happened over a three to 4 day period. The patient should not be receiving anymore calls.

## 2016-08-19 NOTE — Telephone Encounter (Signed)
Perfect. Thanks.

## 2016-08-20 DIAGNOSIS — A630 Anogenital (venereal) warts: Secondary | ICD-10-CM | POA: Diagnosis not present

## 2016-08-25 ENCOUNTER — Ambulatory Visit (AMBULATORY_SURGERY_CENTER): Payer: Self-pay

## 2016-08-25 VITALS — Ht 67.5 in | Wt 159.0 lb

## 2016-08-25 DIAGNOSIS — Z1211 Encounter for screening for malignant neoplasm of colon: Secondary | ICD-10-CM

## 2016-08-25 MED ORDER — NA SULFATE-K SULFATE-MG SULF 17.5-3.13-1.6 GM/177ML PO SOLN: ORAL | 0 refills | Status: DC

## 2016-08-25 NOTE — Progress Notes (Signed)
Per pt, no allergies to soy or egg products.Pt not taking any weight loss meds or using  O2 at home. 

## 2016-09-08 ENCOUNTER — Encounter: Payer: Self-pay | Admitting: Gastroenterology

## 2016-09-08 ENCOUNTER — Ambulatory Visit (AMBULATORY_SURGERY_CENTER): Payer: Medicare Other | Admitting: Gastroenterology

## 2016-09-08 VITALS — BP 114/78 | HR 85 | Temp 97.5°F | Resp 15 | Ht 67.5 in | Wt 159.0 lb

## 2016-09-08 DIAGNOSIS — D122 Benign neoplasm of ascending colon: Secondary | ICD-10-CM

## 2016-09-08 DIAGNOSIS — I10 Essential (primary) hypertension: Secondary | ICD-10-CM | POA: Diagnosis not present

## 2016-09-08 DIAGNOSIS — Z1211 Encounter for screening for malignant neoplasm of colon: Secondary | ICD-10-CM | POA: Diagnosis not present

## 2016-09-08 DIAGNOSIS — D120 Benign neoplasm of cecum: Secondary | ICD-10-CM

## 2016-09-08 DIAGNOSIS — D126 Benign neoplasm of colon, unspecified: Secondary | ICD-10-CM

## 2016-09-08 DIAGNOSIS — K635 Polyp of colon: Secondary | ICD-10-CM

## 2016-09-08 DIAGNOSIS — Z1212 Encounter for screening for malignant neoplasm of rectum: Secondary | ICD-10-CM

## 2016-09-08 MED ORDER — SODIUM CHLORIDE 0.9 % IV SOLN: 500.0000 mL | INTRAVENOUS | Status: DC

## 2016-09-08 NOTE — Patient Instructions (Signed)
Discharge instructions given. Handouts on polyps,diverticulosis and hemorrhoids. No ibuprofen,naproxen,or other non-steroidal anti-inflammatory drugs for 2 weeks. Resume previous medications. YOU HAD AN ENDOSCOPIC PROCEDURE TODAY AT THE [facility]:   Refer to the procedure report that was given to you for any specific questions about what was found during the examination.  If the procedure report does not answer your questions, please call your gastroenterologist to clarify.  If you requested that your care partner not be given the details of your procedure findings, then the procedure report has been included in a sealed envelope for you to review at your convenience later.  YOU SHOULD EXPECT: Some feelings of bloating in the abdomen. Passage of more gas than usual.  Walking can help get rid of the air that was put into your GI tract during the procedure and reduce the bloating. If you had a lower endoscopy (such as a colonoscopy or flexible sigmoidoscopy) you may notice spotting of blood in your stool or on the toilet paper. If you underwent a bowel prep for your procedure, you may not have a normal bowel movement for a few days.  Please Note:  You might notice some irritation and congestion in your nose or some drainage.  This is from the oxygen used during your procedure.  There is no need for concern and it should clear up in a day or so.  SYMPTOMS TO REPORT IMMEDIATELY:   Following lower endoscopy (colonoscopy or flexible sigmoidoscopy):  Excessive amounts of blood in the stool  Significant tenderness or worsening of abdominal pains  Swelling of the abdomen that is new, acute  Fever of 100F or higher   For urgent or emergent issues, a gastroenterologist can be reached at any hour by calling [phone].   DIET:  We do recommend a small meal at first, but then you may proceed to your regular diet.  Drink plenty of fluids but you should avoid alcoholic beverages for 24  hours.  ACTIVITY:  You should plan to take it easy for the rest of today and you should NOT DRIVE or use heavy machinery until tomorrow (because of the sedation medicines used during the test).    FOLLOW UP: Our staff will call the number listed on your records the next business day following your procedure to check on you and address any questions or concerns that you may have regarding the information given to you following your procedure. If we do not reach you, we will leave a message.  However, if you are feeling well and you are not experiencing any problems, there is no need to return our call.  We will assume that you have returned to your regular daily activities without incident.  If any biopsies were taken you will be contacted by phone or by letter within the next 1-3 weeks.  Please call us at [phone] if you have not heard about the biopsies in 3 weeks.    SIGNATURES/CONFIDENTIALITY: You and/or your care partner have signed paperwork which will be entered into your electronic medical record.  These signatures attest to the fact that that the information above on your After Visit Summary has been reviewed and is understood.  Full responsibility of the confidentiality of this discharge information lies with you and/or your care-partner. 

## 2016-09-08 NOTE — Progress Notes (Signed)
Spontaneous respirations throughout. VSS. Resting comfortably. To PACU on room air. Report to  Celia RN. 

## 2016-09-08 NOTE — Progress Notes (Signed)
Called to room to assist during endoscopic procedure.  Patient ID and intended procedure confirmed with present staff. Received instructions for my participation in the procedure from the performing physician.  

## 2016-09-08 NOTE — Op Note (Signed)
[facility] Patient Name: [name] White Procedure Date: 09/08/2016 11:03 AM MRN: [id number] Endoscopist: Remo Lipps P. Marquelle Balow MD, MD Age: 51 Referring MD:  Date of Birth: 07-Jun-1966 Gender: Male Account #: [id number] Procedure:                Colonoscopy Indications:              Screening for colorectal malignant neoplasm, This                            is the patient's first colonoscopy Medicines:                Monitored Anesthesia Care Procedure:                Pre-Anesthesia Assessment:                           - Prior to the procedure, a History and Physical                            was performed, and patient medications and                            allergies were reviewed. The patient's tolerance of                            previous anesthesia was also reviewed. The risks                            and benefits of the procedure and the sedation                            options and risks were discussed with the patient.                            All questions were answered, and informed consent                            was obtained. Prior Anticoagulants: The patient has                            taken no previous anticoagulant or antiplatelet                            agents. ASA Grade Assessment: II - A patient with                            mild systemic disease. After reviewing the risks                            and benefits, the patient was deemed in                            satisfactory condition to undergo the procedure.  After obtaining informed consent, the colonoscope                            was passed under direct vision. Throughout the                            procedure, the patient's blood pressure, pulse, and                            oxygen saturations were monitored continuously. The                            Model CF-HQ190L [id number]) scope was introduced                            through the anus and  advanced to the the cecum,                            identified by appendiceal orifice and ileocecal                            valve. The colonoscopy was performed without                            difficulty. The patient tolerated the procedure                            well. The quality of the bowel preparation was                            adequate. The ileocecal valve, appendiceal orifice,                            and rectum were photographed. Scope In: 11:10:10 AM Scope Out: 11:26:00 AM Scope Withdrawal Time: 0 hours 13 minutes 52 seconds  Total Procedure Duration: 0 hours 15 minutes 50 seconds  Findings:                 The perianal and digital rectal examinations were                            normal.                           A diminutive polyp was found in the cecum. The                            polyp was sessile. The polyp was removed with a                            cold biopsy forceps. Resection and retrieval were                            complete.  A 7 mm polyp was found in the ascending colon. The                            polyp was flat. The polyp was removed with a cold                            snare. Resection and retrieval were complete.                           Scattered small-mouthed diverticula were found in                            the transverse colon and left colon.                           Internal hemorrhoids were found during retroflexion.                           The exam was otherwise without abnormality. Complications:            No immediate complications. Estimated blood loss:                            Minimal. Estimated Blood Loss:     Estimated blood loss was minimal. Impression:               - One diminutive polyp in the cecum, removed with a                            cold biopsy forceps. Resected and retrieved.                           - One 7 mm polyp in the ascending colon, removed                             with a cold snare. Resected and retrieved.                           - Diverticulosis in the transverse colon and in the                            left colon.                           - Internal hemorrhoids.                           - The examination was otherwise normal. Recommendation:           - Patient has a contact number available for                            emergencies. The signs and symptoms of potential  delayed complications were discussed with the                            patient. Return to normal activities tomorrow.                            Written discharge instructions were provided to the                            patient.                           - Resume previous diet.                           - Continue present medications.                           - No ibuprofen, naproxen, or other non-steroidal                            anti-inflammatory drugs for 2 weeks after polyp                            removal.                           - Await pathology results.                           - Repeat colonoscopy is recommended for                            surveillance. The colonoscopy date will be                            determined after pathology results from today's                            exam become available for review. Remo Lipps P. Junella Domke MD, MD 09/08/2016 11:30:13 AM This report has been signed electronically.

## 2016-09-09 ENCOUNTER — Telehealth: Payer: Self-pay

## 2016-09-09 NOTE — Telephone Encounter (Signed)
  Follow up Call-  Call back number 09/08/2016  Post procedure Call Back phone  # [phone]  Permission to leave phone message Yes  Some recent data might be hidden     Patient questions:  Do you have a fever, pain , or abdominal swelling? No. Pain Score  0 *  Have you tolerated food without any problems? Yes.    Have you been able to return to your normal activities? Yes.    Do you have any questions about your discharge instructions: Diet   No. Medications  No. Follow up visit  No.  Do you have questions or concerns about your Care? No.  Actions: * If pain score is 4 or above: No action needed, pain <4.

## 2016-09-10 DIAGNOSIS — A630 Anogenital (venereal) warts: Secondary | ICD-10-CM | POA: Diagnosis not present

## 2016-09-10 DIAGNOSIS — B079 Viral wart, unspecified: Secondary | ICD-10-CM | POA: Diagnosis not present

## 2016-09-14 ENCOUNTER — Encounter: Payer: Self-pay | Admitting: Gastroenterology

## 2016-09-16 ENCOUNTER — Encounter: Payer: Self-pay | Admitting: Family Medicine

## 2016-09-16 DIAGNOSIS — Z86010 Personal history of colonic polyps: Secondary | ICD-10-CM | POA: Insufficient documentation

## 2016-10-08 DIAGNOSIS — A630 Anogenital (venereal) warts: Secondary | ICD-10-CM | POA: Diagnosis not present

## 2016-11-05 DIAGNOSIS — M1611 Unilateral primary osteoarthritis, right hip: Secondary | ICD-10-CM | POA: Diagnosis not present

## 2016-11-05 DIAGNOSIS — M545 Low back pain: Secondary | ICD-10-CM | POA: Diagnosis not present

## 2016-11-05 DIAGNOSIS — M1711 Unilateral primary osteoarthritis, right knee: Secondary | ICD-10-CM | POA: Diagnosis not present

## 2016-11-10 DIAGNOSIS — M1611 Unilateral primary osteoarthritis, right hip: Secondary | ICD-10-CM | POA: Diagnosis not present

## 2016-11-11 ENCOUNTER — Ambulatory Visit (INDEPENDENT_AMBULATORY_CARE_PROVIDER_SITE_OTHER): Payer: Medicare Other | Admitting: Family Medicine

## 2016-11-11 ENCOUNTER — Encounter: Payer: Self-pay | Admitting: Family Medicine

## 2016-11-11 VITALS — BP 108/70 | HR 90 | Temp 98.3°F | Wt 159.6 lb

## 2016-11-11 DIAGNOSIS — R109 Unspecified abdominal pain: Secondary | ICD-10-CM | POA: Diagnosis not present

## 2016-11-11 DIAGNOSIS — M4120 Other idiopathic scoliosis, site unspecified: Secondary | ICD-10-CM | POA: Diagnosis not present

## 2016-11-11 LAB — POCT URINALYSIS DIPSTICK
Bilirubin, UA: NEGATIVE
Blood, UA: NEGATIVE
Glucose, UA: NEGATIVE
Ketones, UA: NEGATIVE
Leukocytes, UA: NEGATIVE
Nitrite, UA: NEGATIVE
Protein, UA: NEGATIVE
Spec Grav, UA: 1.025 (ref 1.030–1.035)
Urobilinogen, UA: 0.2 (ref ?–2.0)
pH, UA: 6 (ref 5.0–8.0)

## 2016-11-11 NOTE — Patient Instructions (Signed)
Go for x-rays as discussed.

## 2016-11-11 NOTE — Progress Notes (Signed)
Subjective:     Patient ID: [name] White., male   DOB: Dec 25, 1965, 51 y.o.   MRN: [id number]  HPI Patient seen with left flank pain for about a week and half. He has history of severe scoliosis and severe osteoarthritis of the right hip and is looking at probable total hip replacement soon. Denies any injury. He describes a dull ache which is intermittent and sometimes 7 out of 10 pain. No alleviating or exacerbating factors. No pleuritic pain. No cough. No dyspnea. He does have past history of kidney stones. No radiation anterior. No gross hematuria. No fevers or chills. No appetite or weight changes. No night sweats.  Past Medical History:  Diagnosis Date  . AF (atrial fibrillation) ([facility]) 2009   nonsustained  . Blood transfusion without reported diagnosis    1960's/ as baby  . Cardiomyopathy secondary 2009   had ablation  . Chronic pain syndrome    back pain  . Hyperlipidemia   . Hypertension   . Nephrolithiasis    2 kidney stones  . PSVT (paroxysmal supraventricular tachycardia) ([facility])    had ablation  . Scoliosis    severe/ has a lot metal in back per pt/    Past Surgical History:  Procedure Laterality Date  . AV NODE ABLATION     09/16/07. SVT Dr. Caryl Comes  . BACK SURGERY     01/12/00 and 1982/ has had 6 surgeries on back  . INGUINAL HERNIA REPAIR     Left  . REPLACEMENT TOTAL KNEE     1999, L. Murphy/wainer.     reports that he quit smoking about 25 years ago. His smoking use included Cigarettes. He has a 10.00 pack-year smoking history. He has never used smokeless tobacco. He reports that he drinks about 14.4 oz of alcohol per week . He reports that he does not use drugs. family history includes Atrial fibrillation in his father; Breast cancer in his mother; Diabetes in his father; Heart attack in his father; Liver disease in his paternal uncle. No Known Allergies   Review of Systems  Constitutional: Negative for appetite change, chills, fever and unexpected weight change.   Respiratory: Negative for shortness of breath.   Cardiovascular: Negative for chest pain.  Gastrointestinal: Negative for abdominal pain, nausea and vomiting.  Genitourinary: Positive for flank pain. Negative for frequency and hematuria.  Skin: Negative for rash.  Neurological: Negative for weakness.  Hematological: Negative for adenopathy.       Objective:   Physical Exam  Constitutional: He appears well-developed and well-nourished.  Cardiovascular: Normal rate and regular rhythm.   Pulmonary/Chest: Effort normal and breath sounds normal. No respiratory distress. He has no wheezes. He has no rales.  Abdominal: Soft. Bowel sounds are normal. He exhibits no distension and no mass. There is no tenderness. There is no rebound and no guarding.  Musculoskeletal:  She has severe scoliosis. No reproducible flank tenderness or rib cage pain       Assessment:     Left flank pain. This is not reproducible or alleviated by movement or change of position which suggests against likely acute musculoskeletal cause. He does have remote history of kidney stones    Plan:     -Check urine dipstick -Obtain x-rays including KUB and chest x-ray -Follow-up immediately for any changes symptoms such as fever or worsening pain -Consider CT study if above unrevealing and pain persists.    Eulas Post MD [facility] at [facility]

## 2016-11-11 NOTE — Progress Notes (Signed)
Pre visit review using our clinic review tool, if applicable. No additional management support is needed unless otherwise documented below in the visit note. 

## 2016-11-24 DIAGNOSIS — M1611 Unilateral primary osteoarthritis, right hip: Secondary | ICD-10-CM | POA: Diagnosis not present

## 2016-11-25 ENCOUNTER — Other Ambulatory Visit: Payer: Self-pay | Admitting: Orthopedic Surgery

## 2016-11-25 ENCOUNTER — Other Ambulatory Visit: Payer: Self-pay | Admitting: Family Medicine

## 2016-11-30 NOTE — Pre-Procedure Instructions (Addendum)
EYTHAN JAYNE Jr.  11/30/2016      [id number] - [place], [place] - [address] AT [address] [place] [address] Phone: [phone] Fax: [phone]  [id number] - [place], [place] - [address] [place] [address] Phone: [phone] Fax: [phone]    Your procedure is scheduled on April 23.  Report to [facility] Admitting at 930 A.M.  Call this number if you have problems the morning of surgery:  [phone]   Remember:  Do not eat food or drink liquids after midnight.  Take these medicines the morning of surgery with A SIP OF WATER Amlodipine (Norvasc), tramadol (Ultram) if needed  Stop taking Asprin, BC's, Goody's, Herbal medications, Fish Oil, Vitamins, Meloxicam (Mobic), Ibuprofen, Advil, Motrin, Aleve   Do not wear jewelry, make-up or nail polish.  Do not wear lotions, powders, or perfumes, or deoderant.  Do not shave 48 hours prior to surgery.  Men may shave face and neck.  Do not bring valuables to the hospital.  [facility] is not responsible for any belongings or valuables.  Contacts, dentures or bridgework may not be worn into surgery.  Leave your suitcase in the car.  After surgery it may be brought to your room.  For patients admitted to the hospital, discharge time will be determined by your treatment team.  Patients discharged the day of surgery will not be allowed to drive home.   Special instructions:  [facility] - Preparing for Surgery  Before surgery, you can play an important role.  Because skin is not sterile, your skin needs to be as free of germs as possible.  You can reduce the number of germs on you skin by washing with CHG (chlorahexidine gluconate) soap before surgery.  CHG is an antiseptic cleaner which kills germs and bonds with the skin to continue killing germs even after washing.  Please DO NOT  use if you have an allergy to CHG or antibacterial soaps.  If your skin becomes reddened/irritated stop using the CHG and inform your nurse when you arrive at Short Stay.  Do not shave (including legs and underarms) for at least 48 hours prior to the first CHG shower.  You may shave your face.  Please follow these instructions carefully:   1.  Shower with CHG Soap the night before surgery and the                                morning of Surgery.  2.  If you choose to wash your hair, wash your hair first as usual with your       normal shampoo.  3.  After you shampoo, rinse your hair and body thoroughly to remove the                      Shampoo.  4.  Use CHG as you would any other liquid soap.  You can apply chg directly       to the skin and wash gently with scrungie or a clean washcloth.  5.  Apply the CHG Soap to your body ONLY FROM THE NECK DOWN.        Do not use on open wounds or open sores.  Avoid contact with your eyes,  ears, mouth and genitals (private parts).  Wash genitals (private parts)       with your normal soap.  6.  Wash thoroughly, paying special attention to the area where your surgery        will be performed.  7.  Thoroughly rinse your body with warm water from the neck down.  8.  DO NOT shower/wash with your normal soap after using and rinsing off       the CHG Soap.  9.  Pat yourself dry with a clean towel.            10.  Wear clean pajamas.            11.  Place clean sheets on your bed the night of your first shower and do not        sleep with pets.  Day of Surgery  Do not apply any lotions/deoderants the morning of surgery.  Please wear clean clothes to the hospital/surgery center.     Please read over the following fact sheets that you were given. Pain Booklet, Coughing and Deep Breathing, MRSA Information and Surgical Site Infection Prevention , Incentive Spirometry

## 2016-12-01 ENCOUNTER — Ambulatory Visit (HOSPITAL_COMMUNITY)
Admission: RE | Admit: 2016-12-01 | Discharge: 2016-12-01 | Disposition: A | Discharge status: Home / Self Care | Payer: Medicare Other | Source: Ambulatory Visit | Attending: Orthopedic Surgery | Admitting: Orthopedic Surgery

## 2016-12-01 ENCOUNTER — Encounter (HOSPITAL_COMMUNITY): Payer: Self-pay

## 2016-12-01 ENCOUNTER — Encounter (HOSPITAL_COMMUNITY)
Admission: RE | Admit: 2016-12-01 | Discharge: 2016-12-01 | Disposition: A | Discharge status: Home / Self Care | Payer: Medicare Other | Source: Ambulatory Visit | Attending: Orthopedic Surgery | Admitting: Orthopedic Surgery

## 2016-12-01 DIAGNOSIS — Z01818 Encounter for other preprocedural examination: Secondary | ICD-10-CM

## 2016-12-01 DIAGNOSIS — J449 Chronic obstructive pulmonary disease, unspecified: Secondary | ICD-10-CM | POA: Diagnosis not present

## 2016-12-01 DIAGNOSIS — Z79899 Other long term (current) drug therapy: Secondary | ICD-10-CM | POA: Diagnosis not present

## 2016-12-01 DIAGNOSIS — M4184 Other forms of scoliosis, thoracic region: Secondary | ICD-10-CM | POA: Insufficient documentation

## 2016-12-01 DIAGNOSIS — A630 Anogenital (venereal) warts: Secondary | ICD-10-CM | POA: Diagnosis not present

## 2016-12-01 DIAGNOSIS — I10 Essential (primary) hypertension: Secondary | ICD-10-CM | POA: Diagnosis not present

## 2016-12-01 DIAGNOSIS — I700 Atherosclerosis of aorta: Secondary | ICD-10-CM | POA: Insufficient documentation

## 2016-12-01 DIAGNOSIS — Z87891 Personal history of nicotine dependence: Secondary | ICD-10-CM | POA: Diagnosis not present

## 2016-12-01 DIAGNOSIS — Z01810 Encounter for preprocedural cardiovascular examination: Secondary | ICD-10-CM | POA: Diagnosis not present

## 2016-12-01 DIAGNOSIS — M1611 Unilateral primary osteoarthritis, right hip: Secondary | ICD-10-CM | POA: Insufficient documentation

## 2016-12-01 DIAGNOSIS — I471 Supraventricular tachycardia: Secondary | ICD-10-CM | POA: Diagnosis not present

## 2016-12-01 HISTORY — DX: Unspecified osteoarthritis, unspecified site: M19.90

## 2016-12-01 HISTORY — DX: Personal history of urinary calculi: Z87.442

## 2016-12-01 HISTORY — DX: Cardiac arrhythmia, unspecified: I49.9

## 2016-12-01 HISTORY — DX: Cardiac murmur, unspecified: R01.1

## 2016-12-01 LAB — URINALYSIS, ROUTINE W REFLEX MICROSCOPIC
Bilirubin Urine: NEGATIVE
Glucose, UA: NEGATIVE mg/dL
Hgb urine dipstick: NEGATIVE
Ketones, ur: NEGATIVE mg/dL
Leukocytes, UA: NEGATIVE
Nitrite: NEGATIVE
Protein, ur: NEGATIVE mg/dL
Specific Gravity, Urine: 1.018 (ref 1.005–1.030)
pH: 6 (ref 5.0–8.0)

## 2016-12-01 LAB — CBC WITH DIFFERENTIAL/PLATELET
Basophils Absolute: 0 10*3/uL (ref 0.0–0.1)
Basophils Relative: 0 %
Eosinophils Absolute: 0 10*3/uL (ref 0.0–0.7)
Eosinophils Relative: 1 %
HCT: 46.4 % (ref 39.0–52.0)
Hemoglobin: 15.8 g/dL (ref 13.0–17.0)
Lymphocytes Relative: 16 %
Lymphs Abs: 1.1 10*3/uL (ref 0.7–4.0)
MCH: 31.5 pg (ref 26.0–34.0)
MCHC: 34.1 g/dL (ref 30.0–36.0)
MCV: 92.4 fL (ref 78.0–100.0)
Monocytes Absolute: 0.5 10*3/uL (ref 0.1–1.0)
Monocytes Relative: 8 %
Neutro Abs: 5 10*3/uL (ref 1.7–7.7)
Neutrophils Relative %: 75 %
Platelets: 297 10*3/uL (ref 150–400)
RBC: 5.02 MIL/uL (ref 4.22–5.81)
RDW: 13.1 % (ref 11.5–15.5)
WBC: 6.6 10*3/uL (ref 4.0–10.5)

## 2016-12-01 LAB — TYPE AND SCREEN
ABO/RH(D): A POS
Antibody Screen: NEGATIVE

## 2016-12-01 LAB — BASIC METABOLIC PANEL
Anion gap: 8 (ref 5–15)
BUN: 12 mg/dL (ref 6–20)
CO2: 30 mmol/L (ref 22–32)
Calcium: 9.8 mg/dL (ref 8.9–10.3)
Chloride: 98 mmol/L — ABNORMAL LOW (ref 101–111)
Creatinine, Ser: 0.82 mg/dL (ref 0.61–1.24)
GFR calc Af Amer: >60 mL/min (ref >60)
GFR calc non Af Amer: >60 mL/min (ref >60)
Glucose, Bld: 113 mg/dL — ABNORMAL HIGH (ref 65–99)
Potassium: 4 mmol/L (ref 3.5–5.1)
Sodium: 136 mmol/L (ref 135–145)

## 2016-12-01 LAB — SURGICAL PCR SCREEN
MRSA, PCR: NEGATIVE
Staphylococcus aureus: POSITIVE — AB

## 2016-12-01 LAB — PROTIME-INR
INR: 1.02
Prothrombin Time: 13.4 seconds (ref 11.4–15.2)

## 2016-12-01 LAB — ABO/RH: ABO/RH(D): A POS

## 2016-12-01 LAB — APTT: aPTT: 28 seconds (ref 24–36)

## 2016-12-01 NOTE — Progress Notes (Addendum)
[facility] [place] Called and let [name] White know of PCR results and the need to pick up script. Voices understanding.

## 2016-12-01 NOTE — Progress Notes (Addendum)
PCP is Dr. Rushie Chestnut Cardiologist is Dr Virl Axe  Had an ablation 2008 for SVT states he last saw Dr Caryl Comes in 2009.  Echo noted in 2008 Denies ever having a card cath, or echo. Denies any chest pain or shortness of breath.

## 2016-12-02 DIAGNOSIS — M1611 Unilateral primary osteoarthritis, right hip: Secondary | ICD-10-CM | POA: Diagnosis present

## 2016-12-02 NOTE — H&P (Signed)
TOTAL HIP ADMISSION H&P  Patient is admitted for right total hip arthroplasty.  Subjective:  Chief Complaint: right hip pain  HPI: [name] White., 51 y.o. male, has a history of pain and functional disability in the right hip(s) due to arthritis and patient has failed non-surgical conservative treatments for greater than 12 weeks to include NSAID's and/or analgesics, corticosteriod injections, flexibility and strengthening excercises, weight reduction as appropriate and activity modification.  Onset of symptoms was gradual starting 3 years ago with gradually worsening course since that time.The patient noted no past surgery on the right hip(s).  Patient currently rates pain in the right hip at 10 out of 10 with activity. Patient has night pain, worsening of pain with activity and weight bearing, trendelenberg gait, pain that interfers with activities of daily living and pain with passive range of motion. Patient has evidence of subchondral cysts and joint space narrowing by imaging studies. This condition presents safety issues increasing the risk of falls.    There is no current active infection.  Patient Active Problem List   Diagnosis Date Noted  . History of colon polyps 09/16/2016  . Genital warts 07/06/2016  . Osteoarthritis of right knee 12/04/2014  . Former smoker 12/04/2014  . Hyperglycemia 05/27/2012  . SVT/ PSVT/ PAT 06/16/2010  . SCOLIOSIS, THORACOLUMBAR 01/06/2010  . Hyperlipidemia 04/14/2007  . Essential hypertension 03/07/2007   Past Medical History:  Diagnosis Date  . AF (atrial fibrillation) ([facility]) 2009   nonsustained  . Arthritis   . Blood transfusion without reported diagnosis    1960's/ as baby  . Cardiomyopathy secondary 2009   had ablation  . Chronic pain syndrome    back pain  . Dysrhythmia    states no longer a problem  . Heart murmur    states no longer there  . History of kidney stones   . Hyperlipidemia   . Hypertension   . Nephrolithiasis    2  kidney stones  . PSVT (paroxysmal supraventricular tachycardia) ([place])    had ablation  . Scoliosis    severe/ has a lot metal in back per pt/     Past Surgical History:  Procedure Laterality Date  . AV NODE ABLATION     09/16/07. SVT Dr. Caryl Comes  . BACK SURGERY     01/12/00 and 1982/ has had 6 surgeries on back  . COLONOSCOPY    . INGUINAL HERNIA REPAIR     Left  . REPLACEMENT TOTAL KNEE     1999, L. Murphy/wainer.     No prescriptions prior to admission.   No Known Allergies  Social History  Substance Use Topics  . Smoking status: Former Smoker    Packs/day: 0.50    Years: 20.00    Types: Cigarettes    Quit date: 08/18/1991  . Smokeless tobacco: Never Used  . Alcohol use 7.2 oz/week    12 Cans of beer per week    Family History  Problem Relation Age of Onset  . Breast cancer Mother   . Diabetes Father   . Heart attack Father     late 46s  . Atrial fibrillation Father   . Liver disease Paternal Uncle      Review of Systems  Constitutional: Negative.   HENT: Negative.   Eyes: Negative.   Respiratory: Negative.   Cardiovascular:       HTN  Gastrointestinal: Negative.   Genitourinary: Negative.   Musculoskeletal: Positive for joint pain and myalgias.  Skin: Negative.  Neurological: Positive for focal weakness.       Poor balance  Endo/Heme/Allergies: Negative.   Psychiatric/Behavioral: Negative.     Objective:  Physical Exam  Constitutional: He is oriented to person, place, and time. He appears well-developed and well-nourished.  HENT:  Head: Normocephalic and atraumatic.  Eyes: Pupils are equal, round, and reactive to light.  Neck: Normal range of motion. Neck supple.  Cardiovascular: Intact distal pulses.   Respiratory: Effort normal.  Musculoskeletal: He exhibits tenderness.  the patient's left hip has good strength good range of motion and no pain.  Patient's right hip has severe pain with any motion.  Increased pain with any attempted internal and  external rotation.  He has less than 5 of internal and external rotation.  His calves are soft and nontender.  He is neurovascularly intact distally.    Neurological: He is alert and oriented to person, place, and time.  Skin: Skin is warm and dry.  Psychiatric: He has a normal mood and affect. His behavior is normal. Judgment and thought content normal.    Vital signs in last 24 hours: Temp:  [97.5 F (36.4 C)] 97.5 F (36.4 C) (04/17 1115) Pulse Rate:  [88] 88 (04/17 1115) Resp:  [20] 20 (04/17 1115) BP: (134)/(74) 134/74 (04/17 1115) SpO2:  [99 %] 99 % (04/17 1115) Weight:  [72.3 kg (159 lb 4.8 oz)] 72.3 kg (159 lb 4.8 oz) (04/17 1115)  Labs:   Estimated body mass index is 24.22 kg/m as calculated from the following:   Height as of 12/01/16: 5\' 8"  (1.727 m).   Weight as of 12/01/16: 72.3 kg (159 lb 4.8 oz).   Imaging Review Plain radiographs demonstrate  arthritis of the right hip with subchondral cyst formation.  Patient's right hip is shortened approximately three fourths of an inch.  Assessment/Plan:  End-stage arthritis right hip bone-on-bone with subluxation of the hip laterally and subchondral cyst formation and a patient with thoracolumbar fusion  The patient history, physical examination, clinical judgement of the provider and imaging studies are consistent with end stage degenerative joint disease of the right hip(s) and total hip arthroplasty is deemed medically necessary. The treatment options including medical management, injection therapy, arthroscopy and arthroplasty were discussed at length. The risks and benefits of total hip arthroplasty were presented and reviewed. The risks due to aseptic loosening, infection, stiffness, dislocation/subluxation,  thromboembolic complications and other imponderables were discussed.  The patient acknowledged the explanation, agreed to proceed with the plan and consent was signed. Patient is being admitted for inpatient treatment for  surgery, pain control, PT, OT, prophylactic antibiotics, VTE prophylaxis, progressive ambulation and ADL's and discharge planning.The patient is planning to be discharged home with home health services

## 2016-12-03 ENCOUNTER — Other Ambulatory Visit: Payer: Self-pay | Admitting: Orthopedic Surgery

## 2016-12-03 NOTE — Progress Notes (Signed)
Anesthesia Chart Review: Patient is a 51 year old male scheduled for right anterior THA on 12/07/16 by Dr. Mayer Camel.  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 Virl Axe; EF 55-60% 07/2007), murmur ("no longer there" per patient; only trivial PR/TR 07/2007 echo), multiple spinal surgeries of scoliosis (thoracolumbar fusion through L3 as adolescent; s/p removal of removal of thoracolumbar instrumentation, L3-4 decompressive laminectomy with bilateral L3 and L4 foraminotomies, L3-4 posterior lumbar interbody fusion utilizing tangent wedges and local autograph, revision of L4-5 instrumentation, L3 through L5 segmental instrumentation, posterior lateral effusion with local autograft L3-4 04/13/00).  PCP is Dr. Garret Reddish with [facility].  Meds include amlodipine, lisinopril, tramadol.  BP 134/74   Pulse 88   Temp 36.4 C   Resp 20   Ht 5\' 8"  (1.727 m)   Wt 159 lb 4.8 oz (72.3 kg)   SpO2 99%   BMI 24.22 kg/m   EKG 12/01/16: NSR.  Echo 08/03/07:SUMMARY - Overall left ventricular systolic function was normal. Left    ventricular ejection fraction was estimated , range being 55    % to 60 %. This study was inadequate for the evaluation of    left ventricular regional wall motion. - There was mild mitral annular calcification. - The pulmonary veins were grossly normal.  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 COPD. No pneumonia, CHF, nor other acute cardiopulmonary abnormality. Thoracic aortic atherosclerosis.  Preoperative labs noted.  If no acute changes then I anticipate that he can proceed as planned. Anesthesia type this posted as Choice. He does have a history of scoliosis with multiple lumbar surgeries. Definitive anesthesia plan following anesthesiologist evaluation  on the day of surgery.  George Hugh [facility]/Anesthesiology Phone [phone] 12/03/2016 11:44 AM

## 2016-12-04 ENCOUNTER — Other Ambulatory Visit: Payer: Self-pay | Admitting: Family Medicine

## 2016-12-04 MED ORDER — BUPIVACAINE LIPOSOME 1.3 % IJ SUSP
20.0000 mL | Freq: Once | INTRAMUSCULAR | Status: AC
  Administered 2016-12-07: 20 mL
  Filled 2016-12-04: qty 20

## 2016-12-04 MED ORDER — SODIUM CHLORIDE 0.9 % IV SOLN
1000.0000 mg | INTRAVENOUS | Status: AC
  Administered 2016-12-07: 1000 mg via INTRAVENOUS
  Filled 2016-12-04: qty 10

## 2016-12-04 MED ORDER — SODIUM CHLORIDE 0.9 % IV SOLN
2000.0000 mg | INTRAVENOUS | Status: AC
  Administered 2016-12-07: 2000 mg via TOPICAL
  Filled 2016-12-04: qty 20

## 2016-12-07 ENCOUNTER — Other Ambulatory Visit: Payer: Self-pay | Admitting: Orthopedic Surgery

## 2016-12-07 ENCOUNTER — Inpatient Hospital Stay (HOSPITAL_COMMUNITY): Payer: 59

## 2016-12-07 ENCOUNTER — Inpatient Hospital Stay (HOSPITAL_COMMUNITY)
Admission: RE | Admit: 2016-12-07 | Discharge: 2016-12-08 | DRG: 470 | Disposition: A | Discharge status: Home / Self Care | Payer: 59 | Source: Ambulatory Visit | Attending: Orthopedic Surgery | Admitting: Orthopedic Surgery

## 2016-12-07 ENCOUNTER — Inpatient Hospital Stay (HOSPITAL_COMMUNITY): Payer: 59 | Admitting: Vascular Surgery

## 2016-12-07 ENCOUNTER — Encounter (HOSPITAL_COMMUNITY): Payer: Self-pay | Admitting: Certified Registered Nurse Anesthetist

## 2016-12-07 ENCOUNTER — Inpatient Hospital Stay (HOSPITAL_COMMUNITY): Payer: 59 | Admitting: Anesthesiology

## 2016-12-07 ENCOUNTER — Encounter (HOSPITAL_COMMUNITY)
Admission: RE | Disposition: A | Discharge status: Home / Self Care | Payer: Self-pay | Source: Ambulatory Visit | Attending: Orthopedic Surgery

## 2016-12-07 DIAGNOSIS — M1711 Unilateral primary osteoarthritis, right knee: Secondary | ICD-10-CM | POA: Diagnosis present

## 2016-12-07 DIAGNOSIS — Z471 Aftercare following joint replacement surgery: Secondary | ICD-10-CM | POA: Diagnosis not present

## 2016-12-07 DIAGNOSIS — M1611 Unilateral primary osteoarthritis, right hip: Principal | ICD-10-CM | POA: Diagnosis present

## 2016-12-07 DIAGNOSIS — Z803 Family history of malignant neoplasm of breast: Secondary | ICD-10-CM

## 2016-12-07 DIAGNOSIS — D62 Acute posthemorrhagic anemia: Secondary | ICD-10-CM | POA: Diagnosis not present

## 2016-12-07 DIAGNOSIS — Z9181 History of falling: Secondary | ICD-10-CM

## 2016-12-07 DIAGNOSIS — Z87891 Personal history of nicotine dependence: Secondary | ICD-10-CM

## 2016-12-07 DIAGNOSIS — I471 Supraventricular tachycardia: Secondary | ICD-10-CM | POA: Diagnosis present

## 2016-12-07 DIAGNOSIS — I10 Essential (primary) hypertension: Secondary | ICD-10-CM | POA: Diagnosis present

## 2016-12-07 DIAGNOSIS — Z86010 Personal history of colonic polyps: Secondary | ICD-10-CM | POA: Diagnosis not present

## 2016-12-07 DIAGNOSIS — Z8249 Family history of ischemic heart disease and other diseases of the circulatory system: Secondary | ICD-10-CM

## 2016-12-07 DIAGNOSIS — E785 Hyperlipidemia, unspecified: Secondary | ICD-10-CM | POA: Diagnosis present

## 2016-12-07 DIAGNOSIS — Z96652 Presence of left artificial knee joint: Secondary | ICD-10-CM | POA: Diagnosis present

## 2016-12-07 DIAGNOSIS — G894 Chronic pain syndrome: Secondary | ICD-10-CM | POA: Diagnosis present

## 2016-12-07 DIAGNOSIS — Z96641 Presence of right artificial hip joint: Secondary | ICD-10-CM | POA: Diagnosis not present

## 2016-12-07 DIAGNOSIS — M419 Scoliosis, unspecified: Secondary | ICD-10-CM | POA: Diagnosis present

## 2016-12-07 DIAGNOSIS — Z419 Encounter for procedure for purposes other than remedying health state, unspecified: Secondary | ICD-10-CM

## 2016-12-07 HISTORY — PX: TOTAL HIP ARTHROPLASTY: SHX124

## 2016-12-07 SURGERY — ARTHROPLASTY, HIP, TOTAL, ANTERIOR APPROACH
Anesthesia: Monitor Anesthesia Care | Laterality: Right

## 2016-12-07 MED ORDER — DOCUSATE SODIUM 100 MG PO CAPS
100.0000 mg | ORAL_CAPSULE | Freq: Two times a day (BID) | ORAL | Status: DC
  Administered 2016-12-07: 100 mg via ORAL
  Filled 2016-12-07 (×2): qty 1

## 2016-12-07 MED ORDER — HYDROMORPHONE HCL 1 MG/ML IJ SOLN
INTRAMUSCULAR | Status: AC
  Filled 2016-12-07: qty 0.5

## 2016-12-07 MED ORDER — GABAPENTIN 300 MG PO CAPS
300.0000 mg | ORAL_CAPSULE | Freq: Three times a day (TID) | ORAL | Status: DC
  Administered 2016-12-07 – 2016-12-08 (×2): 300 mg via ORAL
  Filled 2016-12-07 (×2): qty 1

## 2016-12-07 MED ORDER — DEXAMETHASONE SODIUM PHOSPHATE 10 MG/ML IJ SOLN
10.0000 mg | Freq: Once | INTRAMUSCULAR | Status: AC
  Administered 2016-12-08: 10 mg via INTRAVENOUS
  Filled 2016-12-07: qty 1

## 2016-12-07 MED ORDER — DIPHENHYDRAMINE HCL 25 MG PO TABS
25.0000 mg | ORAL_TABLET | Freq: Every evening | ORAL | Status: DC | PRN
  Administered 2016-12-07: 25 mg via ORAL
  Filled 2016-12-07 (×3): qty 1

## 2016-12-07 MED ORDER — DIPHENHYDRAMINE HCL 12.5 MG/5ML PO ELIX: 12.5000 mg | ORAL_SOLUTION | ORAL | Status: DC | PRN

## 2016-12-07 MED ORDER — BUPIVACAINE HCL (PF) 0.5 % IJ SOLN
INTRAMUSCULAR | Status: DC | PRN
  Administered 2016-12-07: 2.6 mL

## 2016-12-07 MED ORDER — ONDANSETRON HCL 4 MG PO TABS: 4.0000 mg | ORAL_TABLET | Freq: Four times a day (QID) | ORAL | Status: DC | PRN

## 2016-12-07 MED ORDER — LACTATED RINGERS IV SOLN
INTRAVENOUS | Status: DC
  Administered 2016-12-07 (×2): via INTRAVENOUS

## 2016-12-07 MED ORDER — FLEET ENEMA 7-19 GM/118ML RE ENEM: 1.0000 | ENEMA | Freq: Once | RECTAL | Status: DC | PRN

## 2016-12-07 MED ORDER — IMIQUIMOD 5 % EX CREA: 1.0000 "application " | TOPICAL_CREAM | CUTANEOUS | Status: DC

## 2016-12-07 MED ORDER — DEXTROSE 5 % IV SOLN
500.0000 mg | Freq: Four times a day (QID) | INTRAVENOUS | Status: DC | PRN
  Filled 2016-12-07: qty 5

## 2016-12-07 MED ORDER — METOCLOPRAMIDE HCL 5 MG/ML IJ SOLN: 5.0000 mg | Freq: Three times a day (TID) | INTRAMUSCULAR | Status: DC | PRN

## 2016-12-07 MED ORDER — DIPHENHYDRAMINE HCL 25 MG PO CAPS: 25.0000 mg | ORAL_CAPSULE | Freq: Every evening | ORAL | Status: DC | PRN

## 2016-12-07 MED ORDER — BISACODYL 5 MG PO TBEC: 5.0000 mg | DELAYED_RELEASE_TABLET | Freq: Every day | ORAL | Status: DC | PRN

## 2016-12-07 MED ORDER — ACETAMINOPHEN 325 MG PO TABS
650.0000 mg | ORAL_TABLET | Freq: Four times a day (QID) | ORAL | Status: DC | PRN
  Administered 2016-12-08: 650 mg via ORAL
  Filled 2016-12-07: qty 2

## 2016-12-07 MED ORDER — ACETAMINOPHEN 650 MG RE SUPP
650.0000 mg | Freq: Four times a day (QID) | RECTAL | Status: DC | PRN
Start: 2016-12-07 — End: 2016-12-08

## 2016-12-07 MED ORDER — ALUM & MAG HYDROXIDE-SIMETH 200-200-20 MG/5ML PO SUSP: 30.0000 mL | ORAL | Status: DC | PRN

## 2016-12-07 MED ORDER — KCL IN DEXTROSE-NACL 20-5-0.45 MEQ/L-%-% IV SOLN: INTRAVENOUS | Status: DC

## 2016-12-07 MED ORDER — METOCLOPRAMIDE HCL 5 MG PO TABS: 5.0000 mg | ORAL_TABLET | Freq: Three times a day (TID) | ORAL | Status: DC | PRN

## 2016-12-07 MED ORDER — LISINOPRIL 20 MG PO TABS
20.0000 mg | ORAL_TABLET | Freq: Every day | ORAL | Status: DC
  Administered 2016-12-07: 20 mg via ORAL
  Filled 2016-12-07 (×2): qty 1

## 2016-12-07 MED ORDER — SENNOSIDES-DOCUSATE SODIUM 8.6-50 MG PO TABS: 1.0000 | ORAL_TABLET | Freq: Every evening | ORAL | Status: DC | PRN

## 2016-12-07 MED ORDER — HYDROMORPHONE HCL 1 MG/ML IJ SOLN
0.2500 mg | INTRAMUSCULAR | Status: DC | PRN
  Administered 2016-12-07 (×3): 0.5 mg via INTRAVENOUS

## 2016-12-07 MED ORDER — OXYCODONE HCL 5 MG PO TABS: 5.0000 mg | ORAL_TABLET | Freq: Once | ORAL | Status: DC | PRN

## 2016-12-07 MED ORDER — TIZANIDINE HCL 2 MG PO TABS: 2.0000 mg | ORAL_TABLET | Freq: Four times a day (QID) | ORAL | 0 refills | Status: DC | PRN

## 2016-12-07 MED ORDER — OXYCODONE HCL 5 MG PO TABS
5.0000 mg | ORAL_TABLET | ORAL | Status: DC | PRN
  Administered 2016-12-07 – 2016-12-08 (×5): 10 mg via ORAL
  Filled 2016-12-07 (×5): qty 2

## 2016-12-07 MED ORDER — ASPIRIN EC 325 MG PO TBEC
325.0000 mg | DELAYED_RELEASE_TABLET | Freq: Every day | ORAL | Status: DC
  Administered 2016-12-08: 325 mg via ORAL
  Filled 2016-12-07: qty 1

## 2016-12-07 MED ORDER — PROPOFOL 500 MG/50ML IV EMUL
INTRAVENOUS | Status: DC | PRN
  Administered 2016-12-07: 75 ug/kg/min via INTRAVENOUS

## 2016-12-07 MED ORDER — KETAMINE HCL 10 MG/ML IJ SOLN
INTRAMUSCULAR | Status: DC | PRN
  Administered 2016-12-07: 10 mg via INTRAVENOUS

## 2016-12-07 MED ORDER — ONDANSETRON HCL 4 MG/2ML IJ SOLN: 4.0000 mg | Freq: Four times a day (QID) | INTRAMUSCULAR | Status: DC | PRN

## 2016-12-07 MED ORDER — HYDROMORPHONE HCL 1 MG/ML IJ SOLN
1.0000 mg | INTRAMUSCULAR | Status: DC | PRN
  Administered 2016-12-08: 1 mg via INTRAVENOUS
  Filled 2016-12-07 (×3): qty 1

## 2016-12-07 MED ORDER — CHLORHEXIDINE GLUCONATE 4 % EX LIQD: 60.0000 mL | Freq: Once | CUTANEOUS | Status: DC

## 2016-12-07 MED ORDER — FENTANYL CITRATE (PF) 100 MCG/2ML IJ SOLN
INTRAMUSCULAR | Status: DC | PRN
  Administered 2016-12-07: 100 ug via INTRAVENOUS
  Administered 2016-12-07 (×3): 50 ug via INTRAVENOUS

## 2016-12-07 MED ORDER — NAPHAZOLINE-GLYCERIN 0.012-0.2 % OP SOLN: 1.0000 [drp] | Freq: Four times a day (QID) | OPHTHALMIC | Status: DC | PRN

## 2016-12-07 MED ORDER — PHENOL 1.4 % MT LIQD: 1.0000 | OROMUCOSAL | Status: DC | PRN

## 2016-12-07 MED ORDER — HYDROMORPHONE HCL 1 MG/ML IJ SOLN: 0.2500 mg | INTRAMUSCULAR | Status: DC | PRN

## 2016-12-07 MED ORDER — CEFAZOLIN SODIUM-DEXTROSE 2-4 GM/100ML-% IV SOLN
2.0000 g | INTRAVENOUS | Status: AC
  Administered 2016-12-07: 2 g via INTRAVENOUS

## 2016-12-07 MED ORDER — PROPOFOL 10 MG/ML IV BOLUS
INTRAVENOUS | Status: AC
  Filled 2016-12-07: qty 20

## 2016-12-07 MED ORDER — TRANEXAMIC ACID 1000 MG/10ML IV SOLN
1000.0000 mg | Freq: Once | INTRAVENOUS | Status: DC
  Filled 2016-12-07: qty 10

## 2016-12-07 MED ORDER — KETAMINE HCL-SODIUM CHLORIDE 100-0.9 MG/10ML-% IV SOSY
PREFILLED_SYRINGE | INTRAVENOUS | Status: AC
  Filled 2016-12-07: qty 10

## 2016-12-07 MED ORDER — TRANEXAMIC ACID 1000 MG/10ML IV SOLN
2000.0000 mg | INTRAVENOUS | Status: AC
  Administered 2016-12-07: 13:00:00 via TOPICAL
  Filled 2016-12-07: qty 20

## 2016-12-07 MED ORDER — EPINEPHRINE PF 1 MG/ML IJ SOLN
INTRAMUSCULAR | Status: AC
  Filled 2016-12-07: qty 1

## 2016-12-07 MED ORDER — PROPOFOL 10 MG/ML IV BOLUS: INTRAVENOUS | Status: DC | PRN

## 2016-12-07 MED ORDER — AMLODIPINE BESYLATE 10 MG PO TABS
10.0000 mg | ORAL_TABLET | Freq: Every day | ORAL | Status: DC
  Filled 2016-12-07: qty 1

## 2016-12-07 MED ORDER — BUPIVACAINE-EPINEPHRINE (PF) 0.5% -1:200000 IJ SOLN
INTRAMUSCULAR | Status: DC | PRN
  Administered 2016-12-07: 50 mL

## 2016-12-07 MED ORDER — MIDAZOLAM HCL 2 MG/2ML IJ SOLN
INTRAMUSCULAR | Status: AC
  Filled 2016-12-07: qty 2

## 2016-12-07 MED ORDER — PROPOFOL 10 MG/ML IV BOLUS
INTRAVENOUS | Status: DC | PRN
Start: 2016-12-07 — End: 2016-12-07
  Administered 2016-12-07 (×2): 40 mg via INTRAVENOUS
  Administered 2016-12-07: 30 mg via INTRAVENOUS

## 2016-12-07 MED ORDER — METHOCARBAMOL 500 MG PO TABS
500.0000 mg | ORAL_TABLET | Freq: Four times a day (QID) | ORAL | Status: DC | PRN
  Administered 2016-12-07 – 2016-12-08 (×3): 500 mg via ORAL
  Filled 2016-12-07 (×3): qty 1

## 2016-12-07 MED ORDER — CELECOXIB 200 MG PO CAPS
200.0000 mg | ORAL_CAPSULE | Freq: Two times a day (BID) | ORAL | Status: DC
  Administered 2016-12-07 – 2016-12-08 (×2): 200 mg via ORAL
  Filled 2016-12-07 (×2): qty 1

## 2016-12-07 MED ORDER — MIDAZOLAM HCL 5 MG/5ML IJ SOLN
INTRAMUSCULAR | Status: DC | PRN
  Administered 2016-12-07: 2 mg via INTRAVENOUS

## 2016-12-07 MED ORDER — ASPIRIN EC 325 MG PO TBEC: 325.0000 mg | DELAYED_RELEASE_TABLET | Freq: Two times a day (BID) | ORAL | 0 refills | Status: DC

## 2016-12-07 MED ORDER — OXYCODONE-ACETAMINOPHEN 5-325 MG PO TABS: 1.0000 | ORAL_TABLET | ORAL | 0 refills | Status: DC | PRN

## 2016-12-07 MED ORDER — FENTANYL CITRATE (PF) 250 MCG/5ML IJ SOLN
INTRAMUSCULAR | Status: AC
  Filled 2016-12-07: qty 5

## 2016-12-07 MED ORDER — CEFAZOLIN SODIUM-DEXTROSE 2-4 GM/100ML-% IV SOLN
INTRAVENOUS | Status: AC
  Filled 2016-12-07: qty 100

## 2016-12-07 MED ORDER — OXYCODONE HCL 5 MG/5ML PO SOLN: 5.0000 mg | Freq: Once | ORAL | Status: DC | PRN

## 2016-12-07 MED ORDER — BUPIVACAINE HCL (PF) 0.5 % IJ SOLN
INTRAMUSCULAR | Status: AC
  Filled 2016-12-07: qty 60

## 2016-12-07 MED ORDER — MENTHOL 3 MG MT LOZG: 1.0000 | LOZENGE | OROMUCOSAL | Status: DC | PRN

## 2016-12-07 SURGICAL SUPPLY — 45 items
BAG DECANTER FOR FLEXI CONT (MISCELLANEOUS) ×3 IMPLANT
BLADE SAW SGTL 18X1.27X75 (BLADE) ×2 IMPLANT
BLADE SAW SGTL 18X1.27X75MM (BLADE) ×1
CAPT HIP TOTAL 2 ×2 IMPLANT
COVER PERINEAL POST (MISCELLANEOUS) ×3 IMPLANT
COVER SURGICAL LIGHT HANDLE (MISCELLANEOUS) ×3 IMPLANT
DRAPE C-ARM 42X72 X-RAY (DRAPES) ×3 IMPLANT
DRAPE STERI IOBAN 125X83 (DRAPES) ×3 IMPLANT
DRAPE U-SHAPE 47X51 STRL (DRAPES) ×6 IMPLANT
DRSG AQUACEL AG ADV 3.5X10 (GAUZE/BANDAGES/DRESSINGS) ×3 IMPLANT
DURAPREP 26ML APPLICATOR (WOUND CARE) ×3 IMPLANT
ELECT BLADE 4.0 EZ CLEAN MEGAD (MISCELLANEOUS) ×3
ELECT REM PT RETURN 9FT ADLT (ELECTROSURGICAL) ×3
ELECTRODE BLDE 4.0 EZ CLN MEGD (MISCELLANEOUS) ×1 IMPLANT
ELECTRODE REM PT RTRN 9FT ADLT (ELECTROSURGICAL) ×1 IMPLANT
FACESHIELD WRAPAROUND (MASK) ×6 IMPLANT
FACESHIELD WRAPAROUND OR TEAM (MASK) ×2 IMPLANT
GLOVE BIO SURGEON STRL SZ7.5 (GLOVE) ×3 IMPLANT
GLOVE BIO SURGEON STRL SZ8.5 (GLOVE) ×3 IMPLANT
GLOVE BIOGEL PI IND STRL 8 (GLOVE) ×1 IMPLANT
GLOVE BIOGEL PI IND STRL 9 (GLOVE) ×1 IMPLANT
GLOVE BIOGEL PI INDICATOR 8 (GLOVE) ×2
GLOVE BIOGEL PI INDICATOR 9 (GLOVE) ×2
GOWN STRL REUS W/ TWL LRG LVL3 (GOWN DISPOSABLE) ×1 IMPLANT
GOWN STRL REUS W/ TWL XL LVL3 (GOWN DISPOSABLE) ×2 IMPLANT
GOWN STRL REUS W/TWL LRG LVL3 (GOWN DISPOSABLE) ×3
GOWN STRL REUS W/TWL XL LVL3 (GOWN DISPOSABLE) ×6
KIT BASIN OR (CUSTOM PROCEDURE TRAY) ×3 IMPLANT
KIT ROOM TURNOVER OR (KITS) ×3 IMPLANT
MANIFOLD NEPTUNE II (INSTRUMENTS) ×3 IMPLANT
NEEDLE HYPO 22GX1.5 SAFETY (NEEDLE) ×6 IMPLANT
NS IRRIG 1000ML POUR BTL (IV SOLUTION) ×3 IMPLANT
PACK TOTAL JOINT (CUSTOM PROCEDURE TRAY) ×3 IMPLANT
PAD ARMBOARD 7.5X6 YLW CONV (MISCELLANEOUS) ×6 IMPLANT
SUT VIC AB 1 CTX 36 (SUTURE) ×3
SUT VIC AB 1 CTX36XBRD ANBCTR (SUTURE) ×1 IMPLANT
SUT VIC AB 2-0 CT1 27 (SUTURE) ×3
SUT VIC AB 2-0 CT1 TAPERPNT 27 (SUTURE) ×1 IMPLANT
SUT VIC AB 3-0 PS2 18 (SUTURE) ×3
SUT VIC AB 3-0 PS2 18XBRD (SUTURE) ×1 IMPLANT
SYR CONTROL 10ML LL (SYRINGE) ×6 IMPLANT
TOWEL OR 17X24 6PK STRL BLUE (TOWEL DISPOSABLE) ×1 IMPLANT
TOWEL OR 17X26 10 PK STRL BLUE (TOWEL DISPOSABLE) ×1 IMPLANT
TRAY CATH 16FR W/PLASTIC CATH (SET/KITS/TRAYS/PACK) IMPLANT
TRAY FOLEY CATH SILVER 14FR (SET/KITS/TRAYS/PACK) ×2 IMPLANT

## 2016-12-07 NOTE — Anesthesia Preprocedure Evaluation (Addendum)
Anesthesia Evaluation  Patient identified by MRN, date of birth, ID band Patient awake    Reviewed: Allergy & Precautions, H&P , NPO status , Patient's Chart, lab work & pertinent test results  History of Anesthesia Complications Negative for: history of anesthetic complications  Airway Mallampati: I  TM Distance: >3 FB Neck ROM: Full    Dental  (+) Upper Dentures, Lower Dentures   Pulmonary neg pulmonary ROS, former smoker,    Pulmonary exam normal        Cardiovascular hypertension, Pt. on medications + dysrhythmias  Rhythm:Regular  H/o afib ablation   Neuro/Psych negative neurological ROS  negative psych ROS   GI/Hepatic negative GI ROS, Neg liver ROS,   Endo/Other  negative endocrine ROS  Renal/GU negative Renal ROS  negative genitourinary   Musculoskeletal  (+) Arthritis , Osteoarthritis,    Abdominal   Peds  Hematology negative hematology ROS (+)   Anesthesia Other Findings   Reproductive/Obstetrics negative OB ROS                            Anesthesia Physical Anesthesia Plan  ASA: II  Anesthesia Plan: General   Post-op Pain Management:    Induction: Intravenous  Airway Management Planned: Natural Airway, Nasal Cannula and Simple Face Mask  Additional Equipment: None  Intra-op Plan:   Post-operative Plan: Extubation in OR  Informed Consent: I have reviewed the patients History and Physical, chart, labs and discussed the procedure including the risks, benefits and alternatives for the proposed anesthesia with the patient or authorized representative who has indicated his/her understanding and acceptance.   Dental advisory given  Plan Discussed with: CRNA and Surgeon  Anesthesia Plan Comments:        Anesthesia Quick Evaluation

## 2016-12-07 NOTE — Anesthesia Procedure Notes (Signed)
Spinal  Patient location during procedure: OR Staffing Anesthesiologist: Lyndle Herrlich Preanesthetic Checklist Completed: patient identified, site marked, surgical consent, pre-op evaluation, timeout performed, IV checked, risks and benefits discussed and monitors and equipment checked Spinal Block Patient position: sitting Prep: DuraPrep Patient monitoring: heart rate, cardiac monitor, continuous pulse ox and blood pressure Approach: midline Location: L3-4 Injection technique: single-shot Needle Needle type: Sprotte  Needle gauge: 24 G Needle length: 9 cm Assessment Sensory level: T4 Additional Notes Spinal Dosage in OR  .5% Bupivicaine ml       2.6  (+) asp CSF

## 2016-12-07 NOTE — Op Note (Signed)
OPERATIVE REPORT    DATE OF PROCEDURE:  12/07/2016       PREOPERATIVE DIAGNOSIS:  RIGHT HIP OSTEOARTHRITIS                                                          POSTOPERATIVE DIAGNOSIS:  RIGHT HIP OSTEOARTHRITIS                                                             PROCEDURE: Anterior R total hip arthroplasty using a 52 mm DePuy Pinnacle  Cup, [employer], 0-degree polyethylene liner, a +1.5 36 mm ceramic head, a 7 Depuy Triloc stem   SURGEON: Frederik Pear J    ASSISTANT:   Eric K. [employer]  (present throughout entire procedure and necessary for timely completion of the procedure)   ANESTHESIA: Spinal BLOOD LOSS: 300 FLUID REPLACEMENT: 1600 crystalloid Antibiotic: 2gm ancef Tranexamic Acid: 1gm iv, 2gm topical COMPLICATIONS: none    INDICATIONS FOR PROCEDURE: A 51 y.o. year-old With  RIGHT HIP OSTEOARTHRITIS   for 3 years, x-rays show bone-on-bone arthritic changes, and osteophytes. Despite conservative measures with observation, anti-inflammatory medicine, narcotics, use of a cane, has severe unremitting pain and can ambulate only a few blocks before resting. Patient desires elective R total hip arthroplasty to decrease pain and increase function. The risks, benefits, and alternatives were discussed at length including but not limited to the risks of infection, bleeding, nerve injury, stiffness, blood clots, the need for revision surgery, cardiopulmonary complications, among others, and they were willing to proceed. Questions answered     PROCEDURE IN DETAIL: The patient was identified by armband,  received preoperative IV antibiotics in the holding area at [facility] taken to the operating room , appropriate anesthetic monitors  were attached and  anesthesia was induced with the patienton the gurney. The HANA boots were applied to the feet and he was then transferred to the HANA table with a peroneal post and support underneath the non-operative  le, which was locked in 5 lb traction. Theoperative lower extremity was then prepped and draped in the usual sterile fashion from just above the iliac crest to the knee. And a timeout procedure was performed. We then made a 10 cm incision along the interval at the leading edge of the tensor fascia lata of starting at 2 cm lateral to and 2 cm distal to the ASIS. Small bleeders in the skin and subcutaneous tissue identified and cauterized we dissected down to the fascia and made an incision in the fascia allowing [place] to elevate the fascia of the tensor muscle and exploited the interval between the rectus and the tensor fascia lata. A Hohmann retractor was then placed along the superior neck of the femur and a Cobra retractor along the inferior neck of the femur we teed the capsule starting out at the superior anterior aspect of the acetabulum going distally and made the T along the neck both leaflets of the T were tagged with #2 Ethibond suture. Cobra retractors were then placed along the inferior and superior neck allowing [place] to perform a standard neck cut  and removed the femoral head with a power corkscrew. We then placed a right angle Hohmann retractor along the anterior aspect of the acetabulum a spiked Cobra in the cotyloid notch and posteriorly a Muelller retractor. We then sequentially reamed up to a 51 mm basket reamer obtaining good coverage in all quadrants, verified by C-arm imaging. Under C-arm control with and hammered into place a 52 mm Pinnacle cup in 45 of abduction and 15 of anteversion. The cup seated nicely and required no supplemental screws. We then placed a central hole Eliminator and a 0 polyethylene liner. The foot was then externally rotated to 110, the HANA elevator was placed around the flare of the greater trochanter and the limb was extended and abducted delivering the proximal femur up into the wound. A medium Hohmann retractor was placed over the greater trochanter and a Mueller  retractor along the posterior femoral neck completing the exposure. We then performed releases superiorly and and inferiorly of the capsule going back to the pirformis fossa superiorly and to the lesser trochanter inferiorly. We then entered the proximal femur with the box cutting offset chisel followed by, a canal sounder, the chili pepper and broaching up to a 7 broach. This seated nicely and we reamed the calcar. A trial reduction was performed with a 1.5 mm 36 mm head.The limb lengths were excellent the hip was stable in 90 of external rotation. At this point the trial components removed and we hammered into place a # 7 Tri-Lock stem with G[employer] coating. This was a std offset stem and a + 1.5 35 mm ceramic ball was then hammered into place the hip was reduced and final C-arm images obtained. The wound was thoroughly irrigated with normal saline solution. We repaired the ant capsule and the tensor fascia lot a with running 0 vicryl suture. the subcutaneous tissue was closed with 2-0 and 3-0 Vicryl suture followed by an Aquacil dressing. At this point the patient was awaken and transferred to hospital gurney without difficulty. The subcutaneous tissue with 0 and 2-0 undyed Vicryl suture and the skin with running  3-0 vicryl subcuticular suture. Aquacil dressing was applied. The patient was then unclamped, rolled supine, awaken extubated and taken to recovery room without difficulty in stable condition.   Kerin Salen 12/07/2016, 2:08 PM

## 2016-12-07 NOTE — Transfer of Care (Addendum)
Immediate Anesthesia Transfer of Care Note  Patient: [name] White.  Procedure(s) Performed: Procedure(s): TOTAL HIP ARTHROPLASTY ANTERIOR APPROACH (Right)  Patient Location: PACU  Anesthesia Type:MAC and Spinal  Level of Consciousness: awake, alert , oriented and patient cooperative  Airway & Oxygen Therapy: Patient Spontanous Breathing and Patient connected to face mask oxygen  Post-op Assessment: Report given to RN and Post -op Vital signs reviewed and stable  Post vital signs: Reviewed and stable  Last Vitals:  Vitals:   12/07/16 1529 12/07/16 1530  BP: 116/76   Pulse: 60 (!) 54  Resp: 10 (!) 9  Temp:      Last Pain:  Vitals:   12/07/16 1645  TempSrc:   PainSc: 7          Complications: No apparent anesthesia complications

## 2016-12-07 NOTE — Interval H&P Note (Signed)
History and Physical Interval Note:  12/07/2016 9:51 AM  [name] White.  has presented today for surgery, with the diagnosis of RIGHT HIP OSTEOARTHRITIS  The various methods of treatment have been discussed with the patient and family. After consideration of risks, benefits and other options for treatment, the patient has consented to  Procedure(s): TOTAL HIP ARTHROPLASTY ANTERIOR APPROACH (Right) as a surgical intervention .  The patient's history has been reviewed, patient examined, no change in status, stable for surgery.  I have reviewed the patient's chart and labs.  Questions were answered to the patient's satisfaction.     Kerin Salen

## 2016-12-07 NOTE — Discharge Instructions (Signed)

## 2016-12-08 ENCOUNTER — Encounter (HOSPITAL_COMMUNITY): Payer: Self-pay | Admitting: Orthopedic Surgery

## 2016-12-08 LAB — CBC
HCT: 32.6 % — ABNORMAL LOW (ref 39.0–52.0)
Hemoglobin: 10.8 g/dL — ABNORMAL LOW (ref 13.0–17.0)
MCH: 30.5 pg (ref 26.0–34.0)
MCHC: 33.1 g/dL (ref 30.0–36.0)
MCV: 92.1 fL (ref 78.0–100.0)
Platelets: 229 10*3/uL (ref 150–400)
RBC: 3.54 MIL/uL — ABNORMAL LOW (ref 4.22–5.81)
RDW: 12.8 % (ref 11.5–15.5)
WBC: 6.4 10*3/uL (ref 4.0–10.5)

## 2016-12-08 NOTE — Progress Notes (Signed)
Pt ready for d/c home today after PT. Pt met all PT goals, equipment was delivered to pt's room. Discharge instructions and prescriptions reviewed with pt, all questions answered.   [place], Jerry Caras

## 2016-12-08 NOTE — Progress Notes (Signed)
PATIENT ID: [name] White.  MRN: [id number]  DOB/AGE:  [birth year] / 51 y.o.  1 Day Post-Op Procedure(s) (LRB): TOTAL HIP ARTHROPLASTY ANTERIOR APPROACH (Right)    PROGRESS NOTE Subjective: Patient is alert, oriented, no Nausea, no Vomiting, yes passing gas, . Taking PO well. Denies SOB, Chest or Calf Pain. Using Incentive Spirometer, PAS in place. Ambulate: Patient has gotten up to the bathroom without difficulty Patient reports pain as  3/10  .    Objective: Vital signs in last 24 hours: Vitals:   12/07/16 2033 12/08/16 0008 12/08/16 0356 12/08/16 0618  BP: 126/68 (!) 109/53  (!) 90/55  Pulse: (!) 115 99  83  Resp: 17 18 18 18   Temp: 98.6 F (37 C) 98.6 F (37 C)  98.5 F (36.9 C)  TempSrc: Oral Oral  Oral  SpO2: 97% 97%  96%      Intake/Output from previous day: I/O last 3 completed shifts: In: 2590 [P.O.:840; I.V.:1750] Out: 1300 [Urine:900; Blood:400]   Intake/Output this shift: No intake/output data recorded.   LABORATORY DATA:  Recent Labs  12/08/16 0444  WBC 6.4  HGB 10.8*  HCT 32.6*  PLT 229    Examination: Neurologically intact ABD soft Neurovascular intact Sensation intact distally Intact pulses distally Dorsiflexion/Plantar flexion intact Incision: moderate drainage No cellulitis present Compartment soft} Drainage covers 20% of dressing window will not change at this time XR AP&Lat of hip shows well placed\fixed THA  Assessment:   1 Day Post-Op Procedure(s) (LRB): TOTAL HIP ARTHROPLASTY ANTERIOR APPROACH (Right) ADDITIONAL DIAGNOSIS:  Expected Acute Blood Loss Anemia, Hypertension, History of supraventricular tachycardia  Plan: PT/OT WBAT, THA  DVT Prophylaxis: SCDx72 hrs, ASA 325 mg BID x 2 weeks  DISCHARGE PLAN: Home, When passes physical therapy.  DISCHARGE NEEDS: HHPT, Walker and 3-in-1 comode seatPatient ID: [name] Hyun., male   DOB: [birth year], 51 y.o. y.o.   MRN: [id number]

## 2016-12-08 NOTE — Care Management Note (Signed)
Case Management Note  Patient Details  Name: Kellar Westberg. MRN: [id number] Date of Birth: 1966/03/09  Subjective/Objective:   51 yr old male s/p right total hip arthroplasty.                 Action/Plan: Patient will go to outpatient therapy. He will have family support at discharge.   Expected Discharge Date:  12/08/16               Expected Discharge Plan:  Home/Self Care  In-House Referral:  NA  Discharge planning Services  CM Consult  Post Acute Care Choice:  Durable Medical Equipment Choice offered to:  Patient  DME Arranged:  3-N-1, Walker rolling DME Agency:  [place]:  NA [place] Agency:  NA  Status of Service:  Completed, signed off  If discussed at [place] of Stay Meetings, dates discussed:    Additional Comments:  Ninfa Meeker, RN 12/08/2016, 12:07 PM

## 2016-12-08 NOTE — Anesthesia Postprocedure Evaluation (Signed)
Anesthesia Post Note  Patient: [name] White.  Procedure(s) Performed: Procedure(s) (LRB): TOTAL HIP ARTHROPLASTY ANTERIOR APPROACH (Right)  Patient location during evaluation: PACU Anesthesia Type: MAC and Spinal Level of consciousness: oriented and awake and alert Pain management: pain level controlled Vital Signs Assessment: post-procedure vital signs reviewed and stable Respiratory status: spontaneous breathing, respiratory function stable and patient connected to nasal cannula oxygen Cardiovascular status: blood pressure returned to baseline and stable Postop Assessment: no headache and no backache Anesthetic complications: no       Last Vitals:  Vitals:   12/08/16 0356 12/08/16 0618  BP:  (!) 90/55  Pulse:  83  Resp: 18 18  Temp:  36.9 C    Last Pain:  Vitals:   12/08/16 0700  TempSrc:   PainSc: [place]

## 2016-12-08 NOTE — Progress Notes (Signed)
Physical Therapy Treatment Patient Details Name: [name] White. MRN: [id number] DOB: 09/27/1965 Today's Date: 12/08/2016    History of Present Illness Pt is a 51 yo male with R hip pain secondary to end stage R hip OA., s/p RTHA 12/07/16. PMH significant for scoliosis, OA, HTN, and chronic back pain.     PT Comments    Patient is making good progress with PT.  From a mobility standpoint anticipate patient will be ready for DC home when medically ready.    Follow Up Recommendations  Outpatient PT     Equipment Recommendations  Rolling walker with 5" wheels;3in1 (PT)    Recommendations for Other Services       Precautions / Restrictions Precautions Precautions: Fall Restrictions Weight Bearing Restrictions: Yes RLE Weight Bearing: Weight bearing as tolerated    Mobility  Bed Mobility Overal bed mobility: Independent             General bed mobility comments: Pt able to get in and out of bed without use of bed rails.   Transfers Overall transfer level: Modified independent Equipment used: Rolling walker (2 wheeled)             General transfer comment: increased effort  Ambulation/Gait Ambulation/Gait assistance: Supervision Ambulation Distance (Feet): 200 Feet Assistive device: Rolling walker (2 wheeled) Gait Pattern/deviations: Step-through pattern;Decreased stride length;Trunk flexed Gait velocity: slowed Gait velocity interpretation: Below normal speed for age/gender General Gait Details: multimodal cues for posture; increased cadence    Stairs Stairs: Yes   Stair Management: No rails;One rail Right;Forwards;Backwards Number of Stairs:  (3 steps X2) General stair comments: cues for sequencing and technique; assist to stabilize RW; pt demonstrated carry over from previous session  Wheelchair Mobility    Modified Rankin (Stroke Patients Only)       Balance Overall balance assessment: Independent                                           Cognition Arousal/Alertness: Awake/alert Behavior During Therapy: WFL for tasks assessed/performed Overall Cognitive Status: Within Functional Limits for tasks assessed                                        Exercises Total Joint Exercises Ankle Circles/Pumps: AROM;Both;10 reps;Supine Quad Sets: AROM;Right;10 reps;Supine Short Arc Quad: AROM;Right;10 reps;Supine Heel Slides: AROM;Right;10 reps;Supine Hip ABduction/ADduction: AROM;Right;10 reps;Supine Straight Leg Raises: AROM;Right;10 reps;Supine Marching in Standing: AROM;Both;10 reps;Standing Standing Hip Extension: AROM;Both;10 reps;Standing    General Comments General comments (skin integrity, edema, etc.): reviewed activity progression, HEP frequency, and use of ice      Pertinent Vitals/Pain Pain Assessment: 0-10 Pain Score: 5  Pain Location: R hip/thigh Pain Descriptors / Indicators: Aching;Guarding;Sore Pain Intervention(s): Monitored during session;Premedicated before session;Repositioned    Home Living Family/patient expects to be discharged to:: Private residence Living Arrangements: Spouse/significant other Available Help at Discharge: Friend(s);Available 24 hours/day Type of Home: House Home Access: Stairs to enter Entrance Stairs-Rails: None Home Layout: Two level;Able to live on main level with bedroom/bathroom        Prior Function Level of Independence: Independent      Comments: community ambulator and driver   PT Goals (current goals can now be found in the care plan section) Acute Rehab PT Goals Patient Stated Goal: go home  today PT Goal Formulation: With patient Time For Goal Achievement: 12/15/16 Potential to Achieve Goals: Good Progress towards PT goals: Progressing toward goals    Frequency    7X/week      PT Plan Current plan remains appropriate    Co-evaluation             End of Session Equipment Utilized During Treatment: Gait  belt Activity Tolerance: Patient tolerated treatment well Patient left: with call bell/phone within reach;in chair Nurse Communication: Mobility status PT Visit Diagnosis: Other abnormalities of gait and mobility (R26.89);Pain Pain - Right/Left: Right Pain - part of body: Hip     Time: 8833-7445 PT Time Calculation (min) (ACUTE ONLY): 14 min  Charges:  $Gait Training: 8-22 mins                    G Codes:       Earney Navy, PTA Pager: [phone]     Darliss Cheney 12/08/2016, 12:21 PM

## 2016-12-08 NOTE — Evaluation (Signed)
Physical Therapy Evaluation Patient Details Name: [name] White. MRN: [id number] DOB: 1965/09/06 Today's Date: 12/08/2016   History of Present Illness  Pt is a 51 yo male with R hip pain secondary to end stage R hip OA., s/p RTHA 12/07/16. PMH significant for scoliosis, OA, HTN, and chronic back pain.   Clinical Impression  Pt is s/p THA resulting in the deficits listed below (see PT Problem List). Pt is independent with bed mobility and transfers and ambulates 510 feet with RW and min guard and ascend/descends 3 stairs with RW with min guard. Pt will benefit from skilled PT to increase their independence and safety with mobility to allow discharge to the venue listed below.      Follow Up Recommendations Outpatient PT    Equipment Recommendations  Rolling walker with 5" wheels;3in1 (PT)    Recommendations for Other Services       Precautions / Restrictions Precautions Precautions: Fall Restrictions Weight Bearing Restrictions: Yes RLE Weight Bearing: Weight bearing as tolerated      Mobility  Bed Mobility Overal bed mobility: Independent             General bed mobility comments: Pt able to get in and out of bed without use of bed rails.   Transfers Overall transfer level: Modified independent Equipment used: Rolling walker (2 wheeled)             General transfer comment: Pt able to come up to RW with push off from bed and no assist. Pt steadied himself before reaching for the RW.  Ambulation/Gait Ambulation/Gait assistance: Min guard Ambulation Distance (Feet): 510 Feet Assistive device: Rolling walker (2 wheeled) Gait Pattern/deviations: Step-through pattern;Decreased stride length;Trunk flexed Gait velocity: slowed Gait velocity interpretation: Below normal speed for age/gender General Gait Details: pt ambulates with slow steady cadence, no LoB, no buckling, vc for more upright posture and anterior pelvic tilt  Stairs Stairs: Yes Stairs assistance:  Min guard Stair Management: No rails;One rail Right;Forwards;Backwards Number of Stairs: 3 General stair comments: Pt ascend/descend 3 stairs with handrail on R with min guard, pt also ascend/descend stairs using the RW backwards up and forward down with min guard to hold RW      Balance Overall balance assessment: Independent                                           Pertinent Vitals/Pain Pain Assessment: 0-10 Pain Score: 5  Pain Location: R hip Pain Descriptors / Indicators: Constant;Sharp;Aching;Dull Pain Intervention(s): Monitored during session;Limited activity within patient's tolerance;Premedicated before session  [place] expects to be discharged to:: Private residence Living Arrangements: Spouse/significant other Available Help at Discharge: Friend(s);Available 24 hours/day Type of Home: House Home Access: Stairs to enter Entrance Stairs-Rails: None Entrance Stairs-Number of Steps: 3 Home Layout: Two level;Able to live on main level with bedroom/bathroom        Prior Function Level of Independence: Independent         Comments: community ambulator and driver     Hand Dominance        Extremity/Trunk Assessment   Upper Extremity Assessment Upper Extremity Assessment: Overall WFL for tasks assessed    Lower Extremity Assessment Lower Extremity Assessment: RLE deficits/detail RLE Deficits / Details: R hip ROM and strength limited by surgical pain and edema, R knee and ankle ROM WFL RLE: Unable  to fully assess due to pain RLE Sensation:  (intact)    Cervical / Trunk Assessment Cervical / Trunk Assessment: Other exceptions;Kyphotic (scoliosis)  Communication   Communication: No difficulties  Cognition Arousal/Alertness: Awake/alert Behavior During Therapy: WFL for tasks assessed/performed Overall Cognitive Status: Within Functional Limits for tasks assessed                                            Exercises Total Joint Exercises Ankle Circles/Pumps: AROM;Both;10 reps;Supine Quad Sets: AROM;Right;10 reps;Supine Short Arc Quad: AROM;Right;10 reps;Supine Heel Slides: AROM;Right;10 reps;Supine Hip ABduction/ADduction: AROM;Right;10 reps;Supine Straight Leg Raises: AROM;Right;10 reps;Supine Marching in Standing: AROM;Both;10 reps;Standing Standing Hip Extension: AROM;Both;10 reps;Standing   Assessment/Plan    PT Assessment Patient needs continued PT services  PT Problem List Decreased range of motion;Decreased strength;Decreased activity tolerance;Pain       PT Treatment Interventions DME instruction;Gait training;Stair training;Functional mobility training;Therapeutic activities;Therapeutic exercise;Patient/family education    PT Goals (Current goals can be found in the Care Plan section)  Acute Rehab PT Goals Patient Stated Goal: go home today PT Goal Formulation: With patient Time For Goal Achievement: 12/15/16 Potential to Achieve Goals: Good    Frequency 7X/week    End of Session Equipment Utilized During Treatment: Gait belt Activity Tolerance: Patient tolerated treatment well Patient left: in bed;with call bell/phone within reach Nurse Communication: Mobility status PT Visit Diagnosis: Other abnormalities of gait and mobility (R26.89);Pain Pain - Right/Left: Right Pain - part of body: Hip    Time: 0263-7858 PT Time Calculation (min) (ACUTE ONLY): 33 min   Charges:   PT Evaluation $PT Eval Low Complexity: 1 Procedure PT Treatments $Gait Training: 8-22 mins   PT G Codes:        Zenita Kister B. Migdalia Dk PT, DPT Acute Rehabilitation  [phone] Pager [phone]    [place] 12/08/2016, 10:46 AM

## 2016-12-08 NOTE — Discharge Summary (Signed)
Patient ID: [name] White. MRN: [id number] DOB/AGE: February 28, 1966 51 y.o.  Admit date: 12/07/2016 Discharge date: 12/08/2016  Admission Diagnoses:  Principal Problem:   Primary osteoarthritis of right hip Active Problems:   Arthritis of right hip   Discharge Diagnoses:  Same  Past Medical History:  Diagnosis Date  . AF (atrial fibrillation) ([place]) 2009   nonsustained  . Arthritis   . Blood transfusion without reported diagnosis    1960's/ as baby  . Cardiomyopathy secondary 2009   had ablation  . Chronic pain syndrome    back pain  . Dysrhythmia    states no longer a problem  . Heart murmur    states no longer there  . History of kidney stones   . Hyperlipidemia   . Hypertension   . Nephrolithiasis    2 kidney stones  . PSVT (paroxysmal supraventricular tachycardia) ([place])    had ablation  . Scoliosis    severe/ has a lot metal in back per pt/     Surgeries: Procedure(s): TOTAL HIP ARTHROPLASTY ANTERIOR APPROACH on 12/07/2016   Consultants:   Discharged Condition: Improved  Hospital Course: [name] White. is an 51 y.o. male who was admitted 12/07/2016 for operative treatment ofPrimary osteoarthritis of right hip. Patient has severe unremitting pain that affects sleep, daily activities, and work/hobbies. After pre-op clearance the patient was taken to the operating room on 12/07/2016 and underwent  Procedure(s): TOTAL HIP ARTHROPLASTY ANTERIOR APPROACH.    Patient was given perioperative antibiotics: Anti-infectives    Start     Dose/Rate Route Frequency Ordered Stop   12/07/16 0935  ceFAZolin (ANCEF) 2-4 GM/100ML-% IVPB    Comments:  Vira Agar, Beth   : cabinet override      12/07/16 0935 12/07/16 1236   12/07/16 0930  ceFAZolin (ANCEF) IVPB 2g/100 mL premix     2 g 200 mL/hr over 30 Minutes Intravenous On call to O.R. 12/07/16 0930 12/07/16 1236       Patient was given sequential compression devices, early ambulation, and chemoprophylaxis to prevent  DVT.  Patient benefited maximally from hospital stay and there were no complications.    Recent vital signs: Patient Vitals for the past 24 hrs:  BP Temp Temp src Pulse Resp SpO2 Height Weight  12/08/16 1100 - - - - - - 5\' 8"  (1.727 m) 72.6 kg (160 lb)  12/08/16 0618 (!) 90/55 98.5 F (36.9 C) Oral 83 18 96 % - -  12/08/16 0356 - - - - 18 - - -  12/08/16 0008 (!) 109/53 98.6 F (37 C) Oral 99 18 97 % - -  12/07/16 2033 126/68 98.6 F (37 C) Oral (!) 115 17 97 % - -  12/07/16 1825 (!) 144/77 97.6 F (36.4 C) - - 16 97 % - -  12/07/16 1810 - 97.7 F (36.5 C) - - - - - -  12/07/16 1800 - - - 95 17 100 % - -  12/07/16 1759 (!) 144/84 - - 92 (!) 24 95 % - -  12/07/16 1745 - - - 87 18 100 % - -  12/07/16 1744 136/83 - - 75 16 100 % - -  12/07/16 1730 - - - 83 (!) 28 99 % - -  12/07/16 1729 (!) 143/86 - - 79 14 99 % - -  12/07/16 1715 - - - 83 15 100 % - -  12/07/16 1714 135/89 - - 80 12 100 % - -  12/07/16 1700 - - -  82 15 100 % - -  12/07/16 1659 130/81 - - 73 13 100 % - -  12/07/16 1645 - - - (!) 58 13 100 % - -  12/07/16 1644 121/76 - - 63 (!) 9 100 % - -  12/07/16 1631 120/76 - - (!) 56 12 100 % - -  12/07/16 1630 - - - 64 13 100 % - -  12/07/16 1615 - - - 71 17 100 % - -  12/07/16 1614 126/90 - - 62 16 100 % - -  12/07/16 1600 - - - (!) 56 (!) 28 (!) 86 % - -  12/07/16 1559 121/78 - - (!) 52 12 100 % - -  12/07/16 1545 - - - (!) 115 14 96 % - -  12/07/16 1544 115/83 - - (!) 58 12 100 % - -  12/07/16 1530 - - - (!) 54 (!) 9 100 % - -  12/07/16 1529 116/76 - - 60 10 100 % - -  12/07/16 1515 - - - 73 17 100 % - -  12/07/16 1514 120/75 - - 60 11 97 % - -  12/07/16 1500 - - - 72 18 100 % - -  12/07/16 1459 119/67 - - 67 (!) 22 100 % - -  12/07/16 1445 104/75 97.5 F (36.4 C) - 82 (!) 23 100 % - -  12/07/16 1444 - - - 81 (!) 28 100 % - -     Recent laboratory studies:  Recent Labs  12/08/16 0444  WBC 6.4  HGB 10.8*  HCT 32.6*  PLT 229     Discharge Medications:    Allergies as of 12/08/2016      Reactions   No Known Allergies       Medication List    STOP taking these medications   meloxicam 15 MG tablet Commonly known as:  MOBIC   TYLENOL 8 HOUR ARTHRITIS PAIN 650 MG CR tablet Generic drug:  acetaminophen     TAKE these medications   amLODipine 10 MG tablet Commonly known as:  NORVASC TAKE 1 TABLET BY MOUTH EVERY DAY   aspirin EC 325 MG tablet Take 1 tablet (325 mg total) by mouth 2 (two) times daily.   diphenhydrAMINE 25 MG tablet Commonly known as:  BENADRYL Take 25 mg by mouth at bedtime as needed for allergies.   imiquimod 5 % cream Commonly known as:  ALDARA Apply 1 application topically 3 (three) times a week.   lisinopril 20 MG tablet Commonly known as:  PRINIVIL,ZESTRIL TAKE 1 TABLET BY MOUTH EVERY DAY   loperamide 2 MG tablet Commonly known as:  IMODIUM A-D Take 2-4 mg by mouth 3 (three) times daily as needed for diarrhea or loose stools.   multivitamin with minerals Tabs tablet Take 1 tablet by mouth daily. Centrum Silver   oxyCODONE-acetaminophen 5-325 MG tablet Commonly known as:  ROXICET Take 1-2 tablets by mouth every 4 (four) hours as needed.   tetrahydrozoline 0.05 % ophthalmic solution Place 1 drop into both eyes daily as needed (for dry/irritated eyes).   tiZANidine 2 MG tablet Commonly known as:  ZANAFLEX Take 1 tablet (2 mg total) by mouth every 6 (six) hours as needed for muscle spasms.   traMADol 50 MG tablet Commonly known as:  ULTRAM Take 50 mg by mouth every 6 (six) hours as needed (for pain.). Every 6-8 hours as needed for pain            Durable  Medical Equipment        Start     Ordered   12/07/16 1826  DME Walker rolling  Once    Question:  Patient needs a walker to treat with the following condition  Answer:  Arthritis of right hip   12/07/16 1825   12/07/16 1826  DME 3 n 1  Once     12/07/16 1825   12/07/16 1826  DME Bedside commode  Once    Question:  Patient needs a  bedside commode to treat with the following condition  Answer:  Arthritis of right hip   12/07/16 1825      Diagnostic Studies: Dg Chest 2 View  Result Date: 12/01/2016 CLINICAL DATA:  Pre-admission study prior total right hip joint replacement. History of hypertension, atrial fibrillation, former smoker. Known scoliosis. EXAM: CHEST  2 VIEW COMPARISON:  Chest x-ray of February 26, 2010 FINDINGS: The lungs are mildly hyper inflated. There is no focal infiltrate. There is no pleural effusion. The heart and pulmonary vascularity are normal. There is calcification in the wall of the aortic arch. The mediastinum is normal in width. There is severe dextroscoliosis centered at approximately T9-T10. 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 IMPRESSION: Severe dextroscoliosis centered at T9-T10, stable. COPD. No pneumonia, CHF, nor other acute cardiopulmonary abnormality. Thoracic aortic atherosclerosis. Electronically Signed   By: David  Martinique M.D.   On: 12/01/2016 14:27   Dg C-arm 1-60 Min  Result Date: 12/07/2016 CLINICAL DATA:  Right anterior hip arthroplasty. EXAM: OPERATIVE right HIP (WITH PELVIS IF PERFORMED) 2 VIEWS TECHNIQUE: Fluoroscopic spot image(s) were submitted for interpretation post-operatively. FLUOROSCOPY TIME:  17 seconds. COMPARISON:  None. FINDINGS: Status post right total hip arthroplasty. The acetabular and femoral components appear to be well situated. No fracture or dislocation is noted. IMPRESSION: Status post right hip arthroplasty. Electronically Signed   By: Marijo Conception, M.D.   On: 12/07/2016 14:21   Dg Hip Operative Unilat W Or W/o Pelvis Right  Result Date: 12/07/2016 CLINICAL DATA:  Right anterior hip arthroplasty. EXAM: OPERATIVE right HIP (WITH PELVIS IF PERFORMED) 2 VIEWS TECHNIQUE: Fluoroscopic spot image(s) were submitted for interpretation post-operatively. FLUOROSCOPY TIME:  17 seconds. COMPARISON:  None. FINDINGS: Status post right total hip arthroplasty.  The acetabular and femoral components appear to be well situated. No fracture or dislocation is noted. IMPRESSION: Status post right hip arthroplasty. Electronically Signed   By: Marijo Conception, M.D.   On: 12/07/2016 14:21    Disposition:   Discharge Instructions    Call MD / Call 911    Complete by:  As directed    If you experience chest pain or shortness of breath, CALL 911 and be transported to the hospital emergency room.  If you develope a fever above 101 F, pus (white drainage) or increased drainage or redness at the wound, or calf pain, call your surgeon's office.   Constipation Prevention    Complete by:  As directed    Drink plenty of fluids.  Prune juice may be helpful.  You may use a stool softener, such as Colace (over the counter) 100 mg twice a day.  Use MiraLax (over the counter) for constipation as needed.   Diet - low sodium heart healthy    Complete by:  As directed    Driving restrictions    Complete by:  As directed    No driving for 2 weeks   Follow the hip precautions as taught in Physical Therapy  Complete by:  As directed    Increase activity slowly as tolerated    Complete by:  As directed    Patient may shower    Complete by:  As directed    You may shower without a dressing once there is no drainage.  Do not wash over the wound.  If drainage remains, cover wound with plastic wrap and then shower.      Follow-up Information    Kerin Salen, MD Follow up in 2 week(s).   Specialty:  Orthopedic Surgery Contact information: [place] [address] [phone]            Signed: Theodosia Quay 12/08/2016, 11:33 AM

## 2016-12-10 DIAGNOSIS — G894 Chronic pain syndrome: Secondary | ICD-10-CM | POA: Diagnosis not present

## 2016-12-10 DIAGNOSIS — I10 Essential (primary) hypertension: Secondary | ICD-10-CM | POA: Diagnosis not present

## 2016-12-10 DIAGNOSIS — Z471 Aftercare following joint replacement surgery: Secondary | ICD-10-CM | POA: Diagnosis not present

## 2016-12-10 DIAGNOSIS — M1711 Unilateral primary osteoarthritis, right knee: Secondary | ICD-10-CM | POA: Diagnosis not present

## 2016-12-10 DIAGNOSIS — E785 Hyperlipidemia, unspecified: Secondary | ICD-10-CM | POA: Diagnosis not present

## 2016-12-10 DIAGNOSIS — Z96641 Presence of right artificial hip joint: Secondary | ICD-10-CM | POA: Diagnosis not present

## 2016-12-15 DIAGNOSIS — M1711 Unilateral primary osteoarthritis, right knee: Secondary | ICD-10-CM | POA: Diagnosis not present

## 2016-12-15 DIAGNOSIS — Z471 Aftercare following joint replacement surgery: Secondary | ICD-10-CM | POA: Diagnosis not present

## 2016-12-15 DIAGNOSIS — G894 Chronic pain syndrome: Secondary | ICD-10-CM | POA: Diagnosis not present

## 2016-12-15 DIAGNOSIS — Z96641 Presence of right artificial hip joint: Secondary | ICD-10-CM | POA: Diagnosis not present

## 2016-12-15 DIAGNOSIS — I10 Essential (primary) hypertension: Secondary | ICD-10-CM | POA: Diagnosis not present

## 2016-12-15 DIAGNOSIS — E785 Hyperlipidemia, unspecified: Secondary | ICD-10-CM | POA: Diagnosis not present

## 2016-12-17 DIAGNOSIS — M25551 Pain in right hip: Secondary | ICD-10-CM | POA: Diagnosis not present

## 2016-12-18 DIAGNOSIS — S7011XA Contusion of right thigh, initial encounter: Secondary | ICD-10-CM | POA: Diagnosis not present

## 2016-12-18 DIAGNOSIS — Z96641 Presence of right artificial hip joint: Secondary | ICD-10-CM | POA: Diagnosis not present

## 2016-12-18 DIAGNOSIS — M96840 Postprocedural hematoma of a musculoskeletal structure following a musculoskeletal system procedure: Secondary | ICD-10-CM | POA: Diagnosis not present

## 2016-12-22 DIAGNOSIS — M25551 Pain in right hip: Secondary | ICD-10-CM | POA: Diagnosis not present

## 2016-12-22 DIAGNOSIS — Z96641 Presence of right artificial hip joint: Secondary | ICD-10-CM | POA: Diagnosis not present

## 2016-12-22 DIAGNOSIS — Z471 Aftercare following joint replacement surgery: Secondary | ICD-10-CM | POA: Diagnosis not present

## 2016-12-29 DIAGNOSIS — Z471 Aftercare following joint replacement surgery: Secondary | ICD-10-CM | POA: Diagnosis not present

## 2016-12-29 DIAGNOSIS — Z96651 Presence of right artificial knee joint: Secondary | ICD-10-CM | POA: Diagnosis not present

## 2016-12-29 DIAGNOSIS — M25551 Pain in right hip: Secondary | ICD-10-CM | POA: Diagnosis not present

## 2016-12-29 DIAGNOSIS — B078 Other viral warts: Secondary | ICD-10-CM | POA: Diagnosis not present

## 2016-12-31 DIAGNOSIS — Z4789 Encounter for other orthopedic aftercare: Secondary | ICD-10-CM | POA: Diagnosis not present

## 2016-12-31 DIAGNOSIS — M25551 Pain in right hip: Secondary | ICD-10-CM | POA: Diagnosis not present

## 2016-12-31 DIAGNOSIS — Z471 Aftercare following joint replacement surgery: Secondary | ICD-10-CM | POA: Diagnosis not present

## 2017-01-05 DIAGNOSIS — Z471 Aftercare following joint replacement surgery: Secondary | ICD-10-CM | POA: Diagnosis not present

## 2017-01-05 DIAGNOSIS — M25551 Pain in right hip: Secondary | ICD-10-CM | POA: Diagnosis not present

## 2017-01-05 DIAGNOSIS — Z4789 Encounter for other orthopedic aftercare: Secondary | ICD-10-CM | POA: Diagnosis not present

## 2017-01-07 DIAGNOSIS — Z471 Aftercare following joint replacement surgery: Secondary | ICD-10-CM | POA: Diagnosis not present

## 2017-01-07 DIAGNOSIS — M25551 Pain in right hip: Secondary | ICD-10-CM | POA: Diagnosis not present

## 2017-01-07 DIAGNOSIS — Z4789 Encounter for other orthopedic aftercare: Secondary | ICD-10-CM | POA: Diagnosis not present

## 2017-01-12 DIAGNOSIS — Z471 Aftercare following joint replacement surgery: Secondary | ICD-10-CM | POA: Diagnosis not present

## 2017-01-12 DIAGNOSIS — M25551 Pain in right hip: Secondary | ICD-10-CM | POA: Diagnosis not present

## 2017-01-12 DIAGNOSIS — Z4789 Encounter for other orthopedic aftercare: Secondary | ICD-10-CM | POA: Diagnosis not present

## 2017-01-14 DIAGNOSIS — M25551 Pain in right hip: Secondary | ICD-10-CM | POA: Diagnosis not present

## 2017-01-14 DIAGNOSIS — Z4789 Encounter for other orthopedic aftercare: Secondary | ICD-10-CM | POA: Diagnosis not present

## 2017-01-14 DIAGNOSIS — Z471 Aftercare following joint replacement surgery: Secondary | ICD-10-CM | POA: Diagnosis not present

## 2017-01-18 DIAGNOSIS — Z4789 Encounter for other orthopedic aftercare: Secondary | ICD-10-CM | POA: Diagnosis not present

## 2017-01-18 DIAGNOSIS — M25551 Pain in right hip: Secondary | ICD-10-CM | POA: Diagnosis not present

## 2017-01-18 DIAGNOSIS — Z471 Aftercare following joint replacement surgery: Secondary | ICD-10-CM | POA: Diagnosis not present

## 2017-01-21 DIAGNOSIS — Z471 Aftercare following joint replacement surgery: Secondary | ICD-10-CM | POA: Diagnosis not present

## 2017-01-21 DIAGNOSIS — Z4789 Encounter for other orthopedic aftercare: Secondary | ICD-10-CM | POA: Diagnosis not present

## 2017-01-21 DIAGNOSIS — M25551 Pain in right hip: Secondary | ICD-10-CM | POA: Diagnosis not present

## 2017-01-28 DIAGNOSIS — M25551 Pain in right hip: Secondary | ICD-10-CM | POA: Diagnosis not present

## 2017-01-28 DIAGNOSIS — Z471 Aftercare following joint replacement surgery: Secondary | ICD-10-CM | POA: Diagnosis not present

## 2017-01-28 DIAGNOSIS — Z4789 Encounter for other orthopedic aftercare: Secondary | ICD-10-CM | POA: Diagnosis not present

## 2017-01-29 DIAGNOSIS — B078 Other viral warts: Secondary | ICD-10-CM | POA: Diagnosis not present

## 2017-01-29 DIAGNOSIS — A630 Anogenital (venereal) warts: Secondary | ICD-10-CM | POA: Diagnosis not present

## 2017-02-02 DIAGNOSIS — Z4789 Encounter for other orthopedic aftercare: Secondary | ICD-10-CM | POA: Diagnosis not present

## 2017-02-02 DIAGNOSIS — Z471 Aftercare following joint replacement surgery: Secondary | ICD-10-CM | POA: Diagnosis not present

## 2017-02-02 DIAGNOSIS — M25551 Pain in right hip: Secondary | ICD-10-CM | POA: Diagnosis not present

## 2017-02-04 DIAGNOSIS — Z471 Aftercare following joint replacement surgery: Secondary | ICD-10-CM | POA: Diagnosis not present

## 2017-02-04 DIAGNOSIS — Z4789 Encounter for other orthopedic aftercare: Secondary | ICD-10-CM | POA: Diagnosis not present

## 2017-02-04 DIAGNOSIS — M25551 Pain in right hip: Secondary | ICD-10-CM | POA: Diagnosis not present

## 2017-02-09 DIAGNOSIS — M25551 Pain in right hip: Secondary | ICD-10-CM | POA: Diagnosis not present

## 2017-02-09 DIAGNOSIS — Z4789 Encounter for other orthopedic aftercare: Secondary | ICD-10-CM | POA: Diagnosis not present

## 2017-02-09 DIAGNOSIS — Z471 Aftercare following joint replacement surgery: Secondary | ICD-10-CM | POA: Diagnosis not present

## 2017-02-11 DIAGNOSIS — M25551 Pain in right hip: Secondary | ICD-10-CM | POA: Diagnosis not present

## 2017-02-11 DIAGNOSIS — Z471 Aftercare following joint replacement surgery: Secondary | ICD-10-CM | POA: Diagnosis not present

## 2017-02-11 DIAGNOSIS — Z4789 Encounter for other orthopedic aftercare: Secondary | ICD-10-CM | POA: Diagnosis not present

## 2017-03-09 DIAGNOSIS — B078 Other viral warts: Secondary | ICD-10-CM | POA: Diagnosis not present

## 2017-07-02 ENCOUNTER — Other Ambulatory Visit: Payer: Self-pay | Admitting: Orthopedic Surgery

## 2017-07-02 DIAGNOSIS — M545 Low back pain: Secondary | ICD-10-CM

## 2017-10-13 ENCOUNTER — Ambulatory Visit (INDEPENDENT_AMBULATORY_CARE_PROVIDER_SITE_OTHER): Payer: 59 | Admitting: Family Medicine

## 2017-10-13 ENCOUNTER — Encounter: Payer: Self-pay | Admitting: Family Medicine

## 2017-10-13 VITALS — BP 140/90 | HR 100 | Temp 98.6°F | Wt 189.4 lb

## 2017-10-13 DIAGNOSIS — H60392 Other infective otitis externa, left ear: Secondary | ICD-10-CM | POA: Diagnosis not present

## 2017-10-13 MED ORDER — NEOMYCIN-POLYMYXIN-HC 3.5-10000-1 OT SOLN: 3.0000 [drp] | Freq: Four times a day (QID) | OTIC | 0 refills | Status: DC

## 2017-10-13 MED ORDER — CEFDINIR 300 MG PO CAPS: 300.0000 mg | ORAL_CAPSULE | Freq: Two times a day (BID) | ORAL | 0 refills | Status: DC

## 2017-10-13 NOTE — Progress Notes (Signed)
[name] White. is a 52 y.o. male here for an acute visit.  History of Present Illness:   [name] White, CMA acting as scribe for Dr. Briscoe Deutscher.   HPI: Patient in office today for left ear fullness for one day. He is having trouble with hearing out of that ear.  Patient states that he uses his fingernail to clean out his ear in the morning.  He thinks he may have scratched the inside of his ear.  No fevers.  No other treatments.  PMHx, SurgHx, SocialHx, Medications, and Allergies were reviewed in the Visit Navigator and updated as appropriate.  Review of Systems  Constitutional: Negative for chills and fever.  HENT: Positive for hearing loss.        Left ear only started one day ago   Eyes: Negative for blurred vision and double vision.  Respiratory: Negative for cough.   Cardiovascular: Negative for chest pain and palpitations.  Gastrointestinal: Negative for heartburn.  Genitourinary: Negative for dysuria and urgency.  Musculoskeletal: Negative for myalgias.  Neurological: Negative for dizziness.   Current Medications:   Marland Kitchen  Multiple Vitamin (MULTIVITAMIN WITH MINERALS) TABS tablet, Take 1 tablet by mouth daily. Centrum Silver, Disp: , Rfl:   Allergies  Allergen Reactions  . No Known Allergies    Review of Systems:   Pertinent items are noted in the HPI. Otherwise, ROS is negative.  Vitals:   Vitals:   10/13/17 1419  BP: 140/90  Pulse: 100  Temp: 98.6 F (37 C)  TempSrc: Oral  SpO2: 96%  Weight: 189 lb 6.4 oz (85.9 kg)     Body mass index is 28.8 kg/m.  Physical Exam:   Physical Exam  Constitutional: He is oriented to person, place, and time. He appears well-developed and well-nourished. No distress.  HENT:  Head: Normocephalic and atraumatic.  Right Ear: Tympanic membrane and external ear normal.  Left Ear: External ear normal. There is drainage and swelling.  Nose: Nose normal.  Mouth/Throat: Oropharynx is clear and moist.  Eyes: Conjunctivae  and EOM are normal. Pupils are equal, round, and reactive to light.  Neck: Normal range of motion. Neck supple.  Cardiovascular: Normal rate, regular rhythm, normal heart sounds and intact distal pulses.  Pulmonary/Chest: Effort normal and breath sounds normal.  Abdominal: Soft. Bowel sounds are normal.  Musculoskeletal: Normal range of motion.  Neurological: He is alert and oriented to person, place, and time.  Skin: Skin is warm and dry.  Psychiatric: He has a normal mood and affect. His behavior is normal. Judgment and thought content normal.  Nursing note and vitals reviewed.   Assessment and Plan:   1. Other infective acute otitis externa of left ear - cefdinir (OMNICEF) 300 MG capsule; Take 1 capsule (300 mg total) by mouth 2 (two) times daily.  Dispense: 20 capsule; Refill: 0 - neomycin-polymyxin-hydrocortisone (CORTISPORIN) OTIC solution; Place 3 drops into the left ear 4 (four) times daily.  Dispense: 10 mL; Refill: 0   . Reviewed expectations re: course of current medical issues. . Discussed self-management of symptoms. . Outlined signs and symptoms indicating need for more acute intervention. . Patient verbalized understanding and all questions were answered. Marland Kitchen Health Maintenance issues including appropriate healthy diet, exercise, and smoking avoidance were discussed with patient. . See orders for this visit as documented in the electronic medical record. . Patient received an After Visit Summary.  CMA served as Education administrator during this visit. History, Physical, and Plan performed by medical provider. The  above documentation has been reviewed and is accurate and complete. Briscoe Deutscher, D.O.   Briscoe Deutscher, DO [place], [facility] 10/13/2017

## 2017-11-22 ENCOUNTER — Ambulatory Visit (INDEPENDENT_AMBULATORY_CARE_PROVIDER_SITE_OTHER): Payer: 59 | Admitting: Family Medicine

## 2017-11-22 ENCOUNTER — Encounter: Payer: Self-pay | Admitting: Family Medicine

## 2017-11-22 DIAGNOSIS — H60392 Other infective otitis externa, left ear: Secondary | ICD-10-CM | POA: Diagnosis not present

## 2017-11-22 MED ORDER — AMOXICILLIN 875 MG PO TABS: 875.0000 mg | ORAL_TABLET | Freq: Two times a day (BID) | ORAL | 0 refills | Status: DC

## 2017-11-22 MED ORDER — NEOMYCIN-POLYMYXIN-HC 3.5-10000-1 OT SOLN: 3.0000 [drp] | Freq: Four times a day (QID) | OTIC | 0 refills | Status: DC

## 2017-11-22 NOTE — Patient Instructions (Signed)
Start the antibiotic and ear drops.  Please finish your medications for the full 10 days.  Make sure you let us know if your symptoms are not getting better.  Take care, Dr Jerline Pain

## 2017-11-22 NOTE — Progress Notes (Signed)
    Subjective:  [name] Kier. is a 52 y.o. male who presents today for same-day appointment with a chief complaint of ear pain.   HPI:  Ear Pain, established problem, worsening Symptoms started a few weeks ago.  About 6 weeks ago, he was diagnosed with acute otitis externa and treated with a course of Cefdinir and eardrops.  Symptoms resolved after about 5 days and he subsequently stopped his treatment.  He did well until having recurrence of symptoms recently.  Symptoms have worsened over the last several days.  Pain located in his left ear.  Associated with pressure and a full sensation.  No reported fevers or chills.  ROS: Per HPI  Objective:  Physical Exam: BP (!) 148/88 (BP Location: Right Arm)   Pulse 97   Temp 98.5 F (36.9 C) (Oral)   Resp 18   Ht 5\' 8"  (1.727 m)   Wt 185 lb (83.9 kg)   SpO2 95%   BMI 28.13 kg/m   Gen: NAD, resting comfortably HEENT: -Right TM: Clear -Left Ear: Copious amount of purulent discharge.  TM unable to be visualized.  Assessment/Plan:  Left otitis externa Unclear if this is a recurrence or inadequately treated initial episode.  We will restart antibiotic therapy.  Start amoxicillin for 10-day course.  Also start Cortisporin eardrops.  Discussed reasons to return to care.  Advised patient to follow-up if symptoms worsen or are not improving despite treatment.  If still no improvement despite above treatment, will need referral to ENT for cholesteatoma evaluation.  Elevated blood pressure reading Elevated today in setting of acute illness and pain.  Given that he has previously been at goal at recent office visits, we will not start pharmacotherapy today.  If continues to be elevated at future office visits, will need antihypertensive therapy.  Algis Greenhouse. Jerline Pain, MD 11/22/2017 3:33 PM

## 2017-12-10 DIAGNOSIS — M1711 Unilateral primary osteoarthritis, right knee: Secondary | ICD-10-CM | POA: Diagnosis not present

## 2018-07-08 ENCOUNTER — Ambulatory Visit: Payer: 59 | Admitting: Family Medicine

## 2018-07-19 DIAGNOSIS — Z6826 Body mass index (BMI) 26.0-26.9, adult: Secondary | ICD-10-CM | POA: Diagnosis not present

## 2018-07-19 DIAGNOSIS — M5416 Radiculopathy, lumbar region: Secondary | ICD-10-CM | POA: Diagnosis not present

## 2018-07-19 DIAGNOSIS — R030 Elevated blood-pressure reading, without diagnosis of hypertension: Secondary | ICD-10-CM | POA: Diagnosis not present

## 2018-07-22 ENCOUNTER — Other Ambulatory Visit: Payer: Self-pay | Admitting: Neurosurgery

## 2018-07-22 ENCOUNTER — Telehealth: Payer: Self-pay | Admitting: Nurse Practitioner

## 2018-07-22 DIAGNOSIS — M5416 Radiculopathy, lumbar region: Secondary | ICD-10-CM

## 2018-07-22 NOTE — Telephone Encounter (Signed)
Phone call to patient to verify medication list and allergies for myelogram procedure. Pt aware he will not need to hold any medications for this procedure.  

## 2018-08-03 ENCOUNTER — Ambulatory Visit
Admission: RE | Admit: 2018-08-03 | Discharge: 2018-08-03 | Disposition: A | Discharge status: Home / Self Care | Payer: Medicare Other | Source: Ambulatory Visit | Attending: Neurosurgery | Admitting: Neurosurgery

## 2018-08-03 ENCOUNTER — Encounter (HOSPITAL_COMMUNITY): Payer: Self-pay | Admitting: Emergency Medicine

## 2018-08-03 ENCOUNTER — Other Ambulatory Visit: Payer: Self-pay

## 2018-08-03 ENCOUNTER — Emergency Department (HOSPITAL_COMMUNITY)
Admission: EM | Admit: 2018-08-03 | Discharge: 2018-08-03 | Disposition: A | Discharge status: Home / Self Care | Payer: Medicare Other | Attending: Emergency Medicine | Admitting: Emergency Medicine

## 2018-08-03 DIAGNOSIS — Z96652 Presence of left artificial knee joint: Secondary | ICD-10-CM | POA: Insufficient documentation

## 2018-08-03 DIAGNOSIS — Z96641 Presence of right artificial hip joint: Secondary | ICD-10-CM | POA: Insufficient documentation

## 2018-08-03 DIAGNOSIS — G8929 Other chronic pain: Secondary | ICD-10-CM | POA: Insufficient documentation

## 2018-08-03 DIAGNOSIS — M48061 Spinal stenosis, lumbar region without neurogenic claudication: Secondary | ICD-10-CM | POA: Diagnosis not present

## 2018-08-03 DIAGNOSIS — M5416 Radiculopathy, lumbar region: Secondary | ICD-10-CM

## 2018-08-03 DIAGNOSIS — M545 Low back pain: Secondary | ICD-10-CM | POA: Diagnosis not present

## 2018-08-03 DIAGNOSIS — Z87891 Personal history of nicotine dependence: Secondary | ICD-10-CM | POA: Insufficient documentation

## 2018-08-03 DIAGNOSIS — I10 Essential (primary) hypertension: Secondary | ICD-10-CM | POA: Diagnosis not present

## 2018-08-03 LAB — CBC
HCT: 51.2 % (ref 39.0–52.0)
Hemoglobin: 16.9 g/dL (ref 13.0–17.0)
MCH: 29.8 pg (ref 26.0–34.0)
MCHC: 33 g/dL (ref 30.0–36.0)
MCV: 90.3 fL (ref 80.0–100.0)
Platelets: 241 10*3/uL (ref 150–400)
RBC: 5.67 MIL/uL (ref 4.22–5.81)
RDW: 13.8 % (ref 11.5–15.5)
WBC: 7 10*3/uL (ref 4.0–10.5)
nRBC: 0 % (ref 0.0–0.2)

## 2018-08-03 LAB — COMPREHENSIVE METABOLIC PANEL
ALT: 39 U/L (ref 0–44)
AST: 33 U/L (ref 15–41)
Albumin: 3.9 g/dL (ref 3.5–5.0)
Alkaline Phosphatase: 56 U/L (ref 38–126)
Anion gap: 10 (ref 5–15)
BUN: 9 mg/dL (ref 6–20)
CO2: 27 mmol/L (ref 22–32)
Calcium: 9.5 mg/dL (ref 8.9–10.3)
Chloride: 100 mmol/L (ref 98–111)
Creatinine, Ser: 0.82 mg/dL (ref 0.61–1.24)
GFR calc Af Amer: >60 mL/min (ref >60)
GFR calc non Af Amer: >60 mL/min (ref >60)
Glucose, Bld: 96 mg/dL (ref 70–99)
Potassium: 4 mmol/L (ref 3.5–5.1)
Sodium: 137 mmol/L (ref 135–145)
Total Bilirubin: 0.9 mg/dL (ref 0.3–1.2)
Total Protein: 7.3 g/dL (ref 6.5–8.1)

## 2018-08-03 MED ORDER — ONDANSETRON HCL 4 MG/2ML IJ SOLN: 4.0000 mg | Freq: Four times a day (QID) | INTRAMUSCULAR | Status: DC | PRN

## 2018-08-03 MED ORDER — HYDROMORPHONE HCL 1 MG/ML IJ SOLN
1.0000 mg | Freq: Once | INTRAMUSCULAR | Status: AC
  Administered 2018-08-03: 1 mg via INTRAVENOUS
  Filled 2018-08-03: qty 1

## 2018-08-03 MED ORDER — ONDANSETRON HCL 4 MG/2ML IJ SOLN
4.0000 mg | Freq: Once | INTRAMUSCULAR | Status: AC
  Administered 2018-08-03: 4 mg via INTRAMUSCULAR

## 2018-08-03 MED ORDER — DIAZEPAM 5 MG PO TABS
10.0000 mg | ORAL_TABLET | Freq: Once | ORAL | Status: AC
  Administered 2018-08-03: 10 mg via ORAL

## 2018-08-03 MED ORDER — IOHEXOL 180 MG/ML  SOLN
15.0000 mL | Freq: Once | INTRAMUSCULAR | Status: AC | PRN
Start: 2018-08-03 — End: 2018-08-03
  Administered 2018-08-03: 15 mL via INTRATHECAL

## 2018-08-03 MED ORDER — MEPERIDINE HCL 100 MG/ML IJ SOLN
100.0000 mg | Freq: Once | INTRAMUSCULAR | Status: AC
  Administered 2018-08-03: 100 mg via INTRAMUSCULAR

## 2018-08-03 MED ORDER — PREDNISONE 20 MG PO TABS
60.0000 mg | ORAL_TABLET | Freq: Once | ORAL | Status: AC
  Administered 2018-08-03: 60 mg via ORAL

## 2018-08-03 MED ORDER — OXYCODONE HCL 5 MG PO TABS: 5.0000 mg | ORAL_TABLET | ORAL | 0 refills | Status: DC | PRN

## 2018-08-03 MED ORDER — PREDNISONE 10 MG PO TABS: ORAL_TABLET | ORAL | 0 refills | Status: DC

## 2018-08-03 NOTE — Discharge Instructions (Addendum)
You were evaluated in the Emergency Department and after careful evaluation, we did not find any emergent condition requiring admission or further testing in the hospital.  Your symptoms today seem to be due to lumbar stenosis.  We reviewed your myelogram with your neurosurgeon, who wants to start you on a steroid taper and see you soon as possible in clinic.  We gave you your first dose of steroids in the emergency department, you can start your prescription tomorrow.  Please return to the Emergency Department if you experience any worsening of your condition.  We encourage you to follow up with a primary care provider.  Thank you for allowing [place] to be a part of your care.

## 2018-08-03 NOTE — ED Provider Notes (Signed)
[facility] Emergency Department Provider Note MRN:  [id number]  Arrival date & time: 08/03/18     Chief Complaint   Back Pain   History of Present Illness   [name] White. is a 52 y.o. year-old male with a history of chronic back pain status post multiple lower back surgeries presenting to the ED with chief complaint of back pain.  2 months of gradual onset lower back pain.  No exacerbating trauma or activity at onset.  Has become severe for the past several weeks, at times 10 out of 10 in severity.  Progressively worsening numbness to the right leg, for the past several days beginning to feel numb in the left leg.  Has noticed some decreased motor function of the right foot for the past several days as well.  The pain is located in the midline lumbar back, radiates down the right leg, more recently also rating down the left leg.  Patient is followed by Dr. Trenton Gammon, [facility] neurosurgeon.  Patient underwent CT myelogram this morning, here for pain management.  Review of Systems  A complete 10 system review of systems was obtained and all systems are negative except as noted in the HPI and PMH.   Patient's Health History    Past Medical History:  Diagnosis Date  . AF (atrial fibrillation) ([place]) 2009   nonsustained  . Arthritis   . Blood transfusion without reported diagnosis    1960's/ as baby  . Cardiomyopathy secondary 2009   had ablation  . Chronic pain syndrome    back pain  . Dysrhythmia    states no longer a problem  . Heart murmur    states no longer there  . History of kidney stones   . Hyperlipidemia   . Hypertension   . Nephrolithiasis    2 kidney stones  . PSVT (paroxysmal supraventricular tachycardia) ([place])    had ablation  . Scoliosis    severe/ has a lot metal in back per pt/     Past Surgical History:  Procedure Laterality Date  . AV NODE ABLATION     09/16/07. SVT Dr. Caryl Comes  . BACK SURGERY     01/12/00 and 1982/ has had 6 surgeries on  back  . COLONOSCOPY    . INGUINAL HERNIA REPAIR     Left  . REPLACEMENT TOTAL KNEE     1999, L. Murphy/wainer.   Marland Kitchen TOTAL HIP ARTHROPLASTY Right 12/07/2016   Procedure: TOTAL HIP ARTHROPLASTY ANTERIOR APPROACH;  Surgeon: Frederik Pear, MD;  Location: [place];  Service: Orthopedics;  Laterality: Right;    Family History  Problem Relation Age of Onset  . Breast cancer Mother   . Diabetes Father   . Heart attack Father        late 53s  . Atrial fibrillation Father   . Liver disease Paternal Uncle     Social History   Socioeconomic History  . Marital status: Divorced    Spouse name: Not on file  . Number of children: Not on file  . Years of education: Not on file  . Highest education level: Not on file  Occupational History  . Not on file  Social Needs  . Financial resource strain: Not on file  . Food insecurity:    Worry: Not on file    Inability: Not on file  . Transportation needs:    Medical: Not on file    Non-medical: Not on file  Tobacco Use  . Smoking  status: Former Smoker    Packs/day: 0.50    Years: 20.00    Pack years: 10.00    Types: Cigarettes    Last attempt to quit: 08/18/1991    Years since quitting: 26.9  . Smokeless tobacco: Never Used  Substance and Sexual Activity  . Alcohol use: Yes    Alcohol/week: 12.0 standard drinks    Types: 12 Cans of beer per week  . 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 Sexual activity: Not on file  Lifestyle  . Physical activity:    Days per week: Not on file    Minutes per session: Not on file  . Stress: Not on file  Relationships  . Social connections:    Talks on phone: Not on file    Gets together: Not on file    Attends religious service: Not on file    Active member of club or organization: Not on file    Attends meetings of clubs or organizations: Not on file    Relationship status: Not on file  . Intimate partner violence:    Fear of current or ex partner: Not on file    Emotionally abused:  Not on file    Physically abused: Not on file    Forced sexual activity: Not on file  Other Topics Concern  . Not on file  Social History Narrative   Single, divorced. 2 daughters. No grandkids.       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 Out 2001.         Physical Exam  Vital Signs and Nursing Notes reviewed Vitals:   08/03/18 1545 08/03/18 1615  BP: (!) 163/108 (!) 177/108  Pulse: 80 79  Resp:    Temp:    SpO2: 98% 99%    CONSTITUTIONAL: Well-appearing, NAD NEURO:  Alert and oriented x 3, mild decreased sensation bilateral lower extremities, decreased strength of the right foot EYES:  eyes equal and reactive ENT/NECK:  no LAD, no JVD CARDIO: Regular rate, well-perfused, normal S1 and S2 PULM:  CTAB no wheezing or rhonchi GI/GU:  normal bowel sounds, non-distended, non-tender MSK/SPINE:  No gross deformities, no edema SKIN:  no rash, atraumatic PSYCH:  Appropriate speech and behavior  Diagnostic and Interventional Summary    Labs Reviewed  CBC  COMPREHENSIVE METABOLIC PANEL    No orders to display    Medications  HYDROmorphone (DILAUDID) injection 1 mg (1 mg Intravenous Given 08/03/18 1242)  HYDROmorphone (DILAUDID) injection 1 mg (1 mg Intravenous Given 08/03/18 1623)  predniSONE (DELTASONE) tablet 60 mg (60 mg Oral Given 08/03/18 1624)     Procedures Critical Care  ED Course and Medical Decision Making  I have reviewed the triage vital signs and the nursing notes.  Pertinent labs & imaging results that were available during my care of the patient were reviewed by me and considered in my medical decision making (see below for details).  Progressive neurological deficit and acute on chronic back pain in this 52 year old male with multiple lumbar surgeries.  Vital signs stable, no bowel or bladder dysfunction, no saddle anesthesia, but still concern for possible myelopathy given progression of symptoms.  We will follow-up CT myelogram results, will  attempt to discuss case with Dr. Trenton Gammon of neurosurgery.  CT myelogram reveals severe L5-S1 stenosis.  Discussed findings with Dr. Trenton Gammon of neurosurgery, who was able to review the imaging.  Nothing to be done emergently, Dr. Trenton Gammon advised steroid taper and  close follow-up in clinic.  After the discussed management above, the patient was determined to be safe for discharge.  The patient was in agreement with this plan and all questions regarding their care were answered.  ED return precautions were discussed and the patient will return to the ED with any significant worsening of condition.  Barth Kirks. [place] Small, [place] [email or web address]  Final Clinical Impressions(s) / ED Diagnoses     ICD-10-CM   1. Spinal stenosis of lumbar region without neurogenic claudication M48.061     ED Discharge Orders         Ordered    predniSONE (DELTASONE) 10 MG tablet     08/03/18 1622             Maudie Flakes, MD 08/03/18 1625

## 2018-08-03 NOTE — Discharge Instructions (Signed)

## 2018-08-03 NOTE — ED Triage Notes (Signed)
Pt here from [facility] imaging with c/o 8/10 pain after having a Myelogram this morning.  Pt received 100 Mg Demerol Im at approx 1015 today before leaving [place].

## 2018-08-04 DIAGNOSIS — M5416 Radiculopathy, lumbar region: Secondary | ICD-10-CM | POA: Diagnosis not present

## 2018-08-04 DIAGNOSIS — M5137 Other intervertebral disc degeneration, lumbosacral region: Secondary | ICD-10-CM | POA: Diagnosis not present

## 2018-08-19 ENCOUNTER — Other Ambulatory Visit: Payer: Self-pay | Admitting: Neurosurgery

## 2018-08-19 ENCOUNTER — Telehealth: Payer: Self-pay | Admitting: Vascular Surgery

## 2018-08-19 ENCOUNTER — Other Ambulatory Visit: Payer: Self-pay | Admitting: *Deleted

## 2018-08-19 ENCOUNTER — Other Ambulatory Visit (HOSPITAL_COMMUNITY): Payer: Self-pay | Admitting: Neurosurgery

## 2018-08-19 DIAGNOSIS — M5416 Radiculopathy, lumbar region: Secondary | ICD-10-CM

## 2018-08-19 NOTE — Telephone Encounter (Signed)
-----   Message from Willy Eddy, RN sent at 08/19/2018  2:21 PM EST ----- Regarding: OFFICE APPOINTMENT [place] DR. DICKSON . ALIF SURGERY WITH DR. POOL ON 09/20/2018. REMIND PATIENT TO BRING ALL FILMS, MRI CT ETC. TO THIS APPOINTMENT. THANKS, BECKY

## 2018-08-19 NOTE — Telephone Encounter (Signed)
sch appt lvm mld ltr 09/07/2018 1130am consult MD

## 2018-08-29 ENCOUNTER — Other Ambulatory Visit (HOSPITAL_COMMUNITY): Payer: Self-pay | Admitting: Neurosurgery

## 2018-08-29 DIAGNOSIS — R102 Pelvic and perineal pain: Secondary | ICD-10-CM

## 2018-08-29 DIAGNOSIS — M545 Low back pain, unspecified: Secondary | ICD-10-CM

## 2018-09-01 ENCOUNTER — Ambulatory Visit (HOSPITAL_COMMUNITY)
Admission: RE | Admit: 2018-09-01 | Discharge: 2018-09-01 | Disposition: A | Discharge status: Home / Self Care | Payer: Medicare Other | Source: Ambulatory Visit | Attending: Neurosurgery | Admitting: Neurosurgery

## 2018-09-01 DIAGNOSIS — R102 Pelvic and perineal pain: Secondary | ICD-10-CM

## 2018-09-01 DIAGNOSIS — M5416 Radiculopathy, lumbar region: Secondary | ICD-10-CM | POA: Diagnosis not present

## 2018-09-01 DIAGNOSIS — M4807 Spinal stenosis, lumbosacral region: Secondary | ICD-10-CM | POA: Diagnosis not present

## 2018-09-06 ENCOUNTER — Encounter: Payer: 59 | Admitting: Family Medicine

## 2018-09-07 ENCOUNTER — Other Ambulatory Visit: Payer: Self-pay

## 2018-09-07 ENCOUNTER — Ambulatory Visit (INDEPENDENT_AMBULATORY_CARE_PROVIDER_SITE_OTHER): Payer: Medicare Other | Admitting: Vascular Surgery

## 2018-09-07 ENCOUNTER — Encounter: Payer: Self-pay | Admitting: Vascular Surgery

## 2018-09-07 VITALS — BP 158/11 | HR 96 | Temp 98.5°F | Resp 16 | Ht 68.0 in | Wt 195.0 lb

## 2018-09-07 DIAGNOSIS — M5137 Other intervertebral disc degeneration, lumbosacral region: Secondary | ICD-10-CM | POA: Diagnosis not present

## 2018-09-07 DIAGNOSIS — M51379 Other intervertebral disc degeneration, lumbosacral region without mention of lumbar back pain or lower extremity pain: Secondary | ICD-10-CM

## 2018-09-07 NOTE — H&P (View-Only) (Signed)
REASON FOR CONSULT:    To evaluate for anterior retroperitoneal exposure of L5-S1.  The patient is referred by Dr. Annette Stable.  HPI:   [name] White. is a pleasant 53 y.o. male, with a long history of back issues secondary to scoliosis and degenerative disc disease.  He has had 6 previous operations on his back all from the posterior approach.  He is scheduled for a multistage procedure on 09/20/2018.  Initial stage will involve anterior lumbar interbody fusion at L5-S1 and I have been asked to provide anterior retroperitoneal exposure.  He is not had any previous abdominal surgery.  He has had a left inguinal hernia repair in the past.  He has had a long history of back pain for as long as he can remember.  He does have a history of atrial fibrillation is undergone an ablation procedure.  He is followed by Dr. Caryl Comes.  He denies any history of myocardial infarction or history of congestive heart failure.  He is not diabetic.  He does occasionally smoke cigarettes.  He does have a family history of premature cardiovascular disease in his father had heart disease at young age.  I have reviewed the records that were sent from Dr. Marchelle Folks office.  He has a long history of severe lumbar pain with spinal stenosis and radiation of the pain into both lower extremities especially on the right.  The patient is scheduled for surgery on 09/20/2018.  I do not get any history of claudication, rest pain, or nonhealing ulcers.  Past Medical History:  Diagnosis Date  . AF (atrial fibrillation) ([place]) 2009   nonsustained  . Arthritis   . Blood transfusion without reported diagnosis    1960's/ as baby  . Cardiomyopathy secondary 2009   had ablation  . Chronic pain syndrome    back pain  . Dysrhythmia    states no longer a problem  . Heart murmur    states no longer there  . History of kidney stones   . Hyperlipidemia   . Hypertension   . Nephrolithiasis    2 kidney stones  . PSVT (paroxysmal supraventricular  tachycardia) ([place])    had ablation  . Scoliosis    severe/ has a lot metal in back per pt/     Family History  Problem Relation Age of Onset  . Breast cancer Mother   . Diabetes Father   . Heart attack Father        late 67s  . Atrial fibrillation Father   . Liver disease Paternal Uncle     SOCIAL HISTORY: Social History   Socioeconomic History  . Marital status: Divorced    Spouse name: Not on file  . Number of children: Not on file  . Years of education: Not on file  . Highest education level: Not on file  Occupational History  . Not on file  Social Needs  . Financial resource strain: Not on file  . Food insecurity:    Worry: Not on file    Inability: Not on file  . Transportation needs:    Medical: Not on file    Non-medical: Not on file  Tobacco Use  . Smoking status: Current Some Day Smoker    Years: 20.00    Types: Cigarettes    Last attempt to quit: 08/18/1991    Years since quitting: 27.0  . Smokeless tobacco: Never Used  Substance and Sexual Activity  . Alcohol use: Yes    Alcohol/week: 12.0  standard drinks    Types: 12 Cans of beer per week  . 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 Sexual activity: Not on file  Lifestyle  . Physical activity:    Days per week: Not on file    Minutes per session: Not on file  . Stress: Not on file  Relationships  . Social connections:    Talks on phone: Not on file    Gets together: Not on file    Attends religious service: Not on file    Active member of club or organization: Not on file    Attends meetings of clubs or organizations: Not on file    Relationship status: Not on file  . Intimate partner violence:    Fear of current or ex partner: Not on file    Emotionally abused: Not on file    Physically abused: Not on file    Forced sexual activity: Not on file  Other Topics Concern  . Not on file  Social History Narrative   Single, divorced. 2 daughters. No grandkids.       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 Out 2001.        No Known Allergies  Current Outpatient Medications  Medication Sig Dispense Refill  . acetaminophen (TYLENOL) 325 MG tablet Take 650 mg by mouth every 6 (six) hours as needed for moderate pain.     . diphenhydrAMINE (BENADRYL) 25 MG tablet Take 25 mg by mouth at bedtime as needed for allergies.     Marland Kitchen esomeprazole (NEXIUM) 40 MG packet Take 40 mg by mouth daily before breakfast.    . loperamide (IMODIUM A-D) 2 MG tablet Take 2-4 mg by mouth 3 (three) times daily as needed for diarrhea or loose stools.     . Multiple Vitamin (MULTIVITAMIN WITH MINERALS) TABS tablet Take 1 tablet by mouth daily. Centrum Silver    . Oxycodone HCl 10 MG TABS Take 10 mg by mouth every 6 (six) hours as needed (pain).     Current Facility-Administered Medications  Medication Dose Route Frequency Provider Last Rate Last Dose  . 0.9 %  sodium chloride infusion  500 mL Intravenous Continuous Armbruster, Carlota Raspberry, MD        REVIEW OF SYSTEMS:  [X]  denotes positive finding, [ ]  denotes negative finding Cardiac  Comments:  Chest pain or chest pressure:    Shortness of breath upon exertion:    Short of breath when lying flat:    Irregular heart rhythm:        Vascular    Pain in calf, thigh, or hip brought on by ambulation: x   Pain in feet at night that wakes you up from your sleep:     Blood clot in your veins:    Leg swelling:         Pulmonary    Oxygen at home:    Productive cough:     Wheezing:         Neurologic    Sudden weakness in arms or legs:  x   Sudden numbness in arms or legs:  x   Sudden onset of difficulty speaking or slurred speech:    Temporary loss of vision in one eye:     Problems with dizziness:         Gastrointestinal    Blood in stool:     Vomited blood:         Genitourinary  Burning when urinating:     Blood in urine:        Psychiatric    Major depression:         Hematologic    Bleeding problems:      Problems with blood clotting too easily:        Skin    Rashes or ulcers:        Constitutional    Fever or chills:     PHYSICAL EXAM:   Vitals:   09/07/18 1436  BP: (!) 158/11  Pulse: 96  Resp: 16  Temp: 98.5 F (36.9 C)  TempSrc: Oral  SpO2: 96%  Weight: 195 lb (88.5 kg)  Height: 5\' 8"  (1.727 m)    GENERAL: The patient is a well-nourished male, in no acute distress. The vital signs are documented above. CARDIAC: There is a regular rate and rhythm.  VASCULAR: I do not detect carotid bruits. He has palpable dorsalis pedis pulses bilaterally.  I cannot palpate posterior tibial pulses however he has biphasic posterior tibial signals with the Doppler. PULMONARY: There is good air exchange bilaterally without wheezing or rales. ABDOMEN: Soft and non-tender with normal pitched bowel sounds.  MUSCULOSKELETAL: There are no major deformities or cyanosis. NEUROLOGIC: No focal weakness or paresthesias are detected. SKIN: There are no ulcers or rashes noted. PSYCHIATRIC: The patient has a normal affect.  DATA:    CT LUMBAR SPINE: I have reviewed the CT lumbar spine that was done on 09/01/2018.  The patient has severe spinal stenosis at L2-L3 and L5-S1.  Patient has a solid fusion L3-L5.    ASSESSMENT & PLAN:   SIGNIFICANT DEGENERATIVE DISC DISEASE OF THE LUMBOSACRAL SPINE: I think he is a reasonable candidate for anterior retroperitoneal exposure of L5-S1 for anterior lumbar interbody fusion.  Certainly his anatomy may complicate things some given his severe scoliosis.  Based on his CT scan I do not see any significant issues with calcific disease in his arteries. I have reviewed our role in exposure of the spine in order to allow anterior lumbar interbody fusion at the appropriate levels. We have discussed the potential complications of surgery, including but not limited to, arterial or venous injury, thrombosis, or bleeding. We have also discussed the potential risks of wound healing  problems, the development of a hernia, nerve injury, leg swelling, or other unpredictable medical problems. All the patient's questions were answered and they are agreeable to proceed.    Deitra Mayo [facility] [phone]

## 2018-09-07 NOTE — Progress Notes (Signed)
REASON FOR CONSULT:    To evaluate for anterior retroperitoneal exposure of L5-S1.  The patient is referred by Dr. Annette Stable.  HPI:   [name] White. is a pleasant 53 y.o. male, with a long history of back issues secondary to scoliosis and degenerative disc disease.  He has had 6 previous operations on his back all from the posterior approach.  He is scheduled for a multistage procedure on 09/20/2018.  Initial stage will involve anterior lumbar interbody fusion at L5-S1 and I have been asked to provide anterior retroperitoneal exposure.  He is not had any previous abdominal surgery.  He has had a left inguinal hernia repair in the past.  He has had a long history of back pain for as long as he can remember.  He does have a history of atrial fibrillation is undergone an ablation procedure.  He is followed by Dr. Caryl Comes.  He denies any history of myocardial infarction or history of congestive heart failure.  He is not diabetic.  He does occasionally smoke cigarettes.  He does have a family history of premature cardiovascular disease in his father had heart disease at young age.  I have reviewed the records that were sent from Dr. Marchelle Folks office.  He has a long history of severe lumbar pain with spinal stenosis and radiation of the pain into both lower extremities especially on the right.  The patient is scheduled for surgery on 09/20/2018.  I do not get any history of claudication, rest pain, or nonhealing ulcers.  Past Medical History:  Diagnosis Date  . AF (atrial fibrillation) ([place]) 2009   nonsustained  . Arthritis   . Blood transfusion without reported diagnosis    1960's/ as baby  . Cardiomyopathy secondary 2009   had ablation  . Chronic pain syndrome    back pain  . Dysrhythmia    states no longer a problem  . Heart murmur    states no longer there  . History of kidney stones   . Hyperlipidemia   . Hypertension   . Nephrolithiasis    2 kidney stones  . PSVT (paroxysmal supraventricular  tachycardia) ([place])    had ablation  . Scoliosis    severe/ has a lot metal in back per pt/     Family History  Problem Relation Age of Onset  . Breast cancer Mother   . Diabetes Father   . Heart attack Father        late 61s  . Atrial fibrillation Father   . Liver disease Paternal Uncle     SOCIAL HISTORY: Social History   Socioeconomic History  . Marital status: Divorced    Spouse name: Not on file  . Number of children: Not on file  . Years of education: Not on file  . Highest education level: Not on file  Occupational History  . Not on file  Social Needs  . Financial resource strain: Not on file  . Food insecurity:    Worry: Not on file    Inability: Not on file  . Transportation needs:    Medical: Not on file    Non-medical: Not on file  Tobacco Use  . Smoking status: Current Some Day Smoker    Years: 20.00    Types: Cigarettes    Last attempt to quit: 08/18/1991    Years since quitting: 27.0  . Smokeless tobacco: Never Used  Substance and Sexual Activity  . Alcohol use: Yes    Alcohol/week: 12.0  standard drinks    Types: 12 Cans of beer per week  . 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 Sexual activity: Not on file  Lifestyle  . Physical activity:    Days per week: Not on file    Minutes per session: Not on file  . Stress: Not on file  Relationships  . Social connections:    Talks on phone: Not on file    Gets together: Not on file    Attends religious service: Not on file    Active member of club or organization: Not on file    Attends meetings of clubs or organizations: Not on file    Relationship status: Not on file  . Intimate partner violence:    Fear of current or ex partner: Not on file    Emotionally abused: Not on file    Physically abused: Not on file    Forced sexual activity: Not on file  Other Topics Concern  . Not on file  Social History Narrative   Single, divorced. 2 daughters. No grandkids.       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 Out 2001.        No Known Allergies  Current Outpatient Medications  Medication Sig Dispense Refill  . acetaminophen (TYLENOL) 325 MG tablet Take 650 mg by mouth every 6 (six) hours as needed for moderate pain.     . diphenhydrAMINE (BENADRYL) 25 MG tablet Take 25 mg by mouth at bedtime as needed for allergies.     Marland Kitchen esomeprazole (NEXIUM) 40 MG packet Take 40 mg by mouth daily before breakfast.    . loperamide (IMODIUM A-D) 2 MG tablet Take 2-4 mg by mouth 3 (three) times daily as needed for diarrhea or loose stools.     . Multiple Vitamin (MULTIVITAMIN WITH MINERALS) TABS tablet Take 1 tablet by mouth daily. Centrum Silver    . Oxycodone HCl 10 MG TABS Take 10 mg by mouth every 6 (six) hours as needed (pain).     Current Facility-Administered Medications  Medication Dose Route Frequency Provider Last Rate Last Dose  . 0.9 %  sodium chloride infusion  500 mL Intravenous Continuous White, [name] Raspberry, MD        REVIEW OF SYSTEMS:  [X]  denotes positive finding, [ ]  denotes negative finding Cardiac  Comments:  Chest pain or chest pressure:    Shortness of breath upon exertion:    Short of breath when lying flat:    Irregular heart rhythm:        Vascular    Pain in calf, thigh, or hip brought on by ambulation: x   Pain in feet at night that wakes you up from your sleep:     Blood clot in your veins:    Leg swelling:         Pulmonary    Oxygen at home:    Productive cough:     Wheezing:         Neurologic    Sudden weakness in arms or legs:  x   Sudden numbness in arms or legs:  x   Sudden onset of difficulty speaking or slurred speech:    Temporary loss of vision in one eye:     Problems with dizziness:         Gastrointestinal    Blood in stool:     Vomited blood:         Genitourinary  Burning when urinating:     Blood in urine:        Psychiatric    Major depression:         Hematologic    Bleeding problems:      Problems with blood clotting too easily:        Skin    Rashes or ulcers:        Constitutional    Fever or chills:     PHYSICAL EXAM:   Vitals:   09/07/18 1436  BP: (!) 158/11  Pulse: 96  Resp: 16  Temp: 98.5 F (36.9 C)  TempSrc: Oral  SpO2: 96%  Weight: 195 lb (88.5 kg)  Height: 5\' 8"  (1.727 m)    GENERAL: The patient is a well-nourished male, in no acute distress. The vital signs are documented above. CARDIAC: There is a regular rate and rhythm.  VASCULAR: I do not detect carotid bruits. He has palpable dorsalis pedis pulses bilaterally.  I cannot palpate posterior tibial pulses however he has biphasic posterior tibial signals with the Doppler. PULMONARY: There is good air exchange bilaterally without wheezing or rales. ABDOMEN: Soft and non-tender with normal pitched bowel sounds.  MUSCULOSKELETAL: There are no major deformities or cyanosis. NEUROLOGIC: No focal weakness or paresthesias are detected. SKIN: There are no ulcers or rashes noted. PSYCHIATRIC: The patient has a normal affect.  DATA:    CT LUMBAR SPINE: I have reviewed the CT lumbar spine that was done on 09/01/2018.  The patient has severe spinal stenosis at L2-L3 and L5-S1.  Patient has a solid fusion L3-L5.    ASSESSMENT & PLAN:   SIGNIFICANT DEGENERATIVE DISC DISEASE OF THE LUMBOSACRAL SPINE: I think he is a reasonable candidate for anterior retroperitoneal exposure of L5-S1 for anterior lumbar interbody fusion.  Certainly his anatomy may complicate things some given his severe scoliosis.  Based on his CT scan I do not see any significant issues with calcific disease in his arteries. I have reviewed our role in exposure of the spine in order to allow anterior lumbar interbody fusion at the appropriate levels. We have discussed the potential complications of surgery, including but not limited to, arterial or venous injury, thrombosis, or bleeding. We have also discussed the potential risks of wound healing  problems, the development of a hernia, nerve injury, leg swelling, or other unpredictable medical problems. All the patient's questions were answered and they are agreeable to proceed.    Deitra Mayo [facility] [phone]

## 2018-09-09 NOTE — Pre-Procedure Instructions (Signed)
[name] White.  09/09/2018      [id number] - [place], [place] - [address] [place]  [address] Phone: [phone] Fax: [phone]    Your procedure is scheduled on Tuesday, February 4th.  Report to [facility] Admitting at 5:30 A.M.  Call this number if you have problems the morning of surgery:  [phone]   Remember:  Do not eat or drink after midnight.    Take these medicines the morning of surgery with A SIP OF WATER  esomeprazole (NEXIUM)  Oxycodone HCl-as needed for pain  acetaminophen (TYLENOL)-as needed for pain  7 days prior to surgery STOP taking any Aspirin (unless otherwise instructed by your surgeon), Aleve, Naproxen, Ibuprofen, Motrin, Advil, Goody's, BC's, all herbal medications, fish oil, and all vitamins.     Do not wear jewelry.  Do not wear lotions, powders, or colognes, or deodorant.  Men may shave face and neck.  Do not bring valuables to the hospital.  [facility] is not responsible for any belongings or valuables.  Contacts, dentures or bridgework may not be worn into surgery.  Leave your suitcase in the car.  After surgery it may be brought to your room.  For patients admitted to the hospital, discharge time will be determined by your treatment team.  Patients discharged the day of surgery will not be allowed to drive home.   Special instructions:   [place]- Preparing For Surgery  Before surgery, you can play an important role. Because skin is not sterile, your skin needs to be as free of germs as possible. You can reduce the number of germs on your skin by washing with CHG (chlorahexidine gluconate) Soap before surgery.  CHG is an antiseptic cleaner which kills germs and bonds with the skin to continue killing germs even after washing.    Oral Hygiene is also important to reduce your risk of infection.  Remember - BRUSH YOUR TEETH THE MORNING OF SURGERY  WITH YOUR REGULAR TOOTHPASTE  Please do not use if you have an allergy to CHG or antibacterial soaps. If your skin becomes reddened/irritated stop using the CHG.  Do not shave (including legs and underarms) for at least 48 hours prior to first CHG shower. It is OK to shave your face.  Please follow these instructions carefully.   1. Shower the NIGHT BEFORE SURGERY and the MORNING OF SURGERY with CHG.   2. If you chose to wash your hair, wash your hair first as usual with your normal shampoo.  3. After you shampoo, rinse your hair and body thoroughly to remove the shampoo.  4. Use CHG as you would any other liquid soap. You can apply CHG directly to the skin and wash gently with a scrungie or a clean washcloth.   5. Apply the CHG Soap to your body ONLY FROM THE NECK DOWN.  Do not use on open wounds or open sores. Avoid contact with your eyes, ears, mouth and genitals (private parts). Wash Face and genitals (private parts)  with your normal soap.  6. Wash thoroughly, paying special attention to the area where your surgery will be performed.  7. Thoroughly rinse your body with warm water from the neck down.  8. DO NOT shower/wash with your normal soap after using and rinsing off the CHG Soap.  9. Pat yourself dry with a CLEAN TOWEL.  10. Wear CLEAN PAJAMAS to bed  the night before surgery, wear comfortable clothes the morning of surgery  11. Place CLEAN SHEETS on your bed the night of your first shower and DO NOT SLEEP WITH PETS.    Day of Surgery:  Do not apply any deodorants/lotions.  Please wear clean clothes to the hospital/surgery center.   Remember to brush your teeth WITH YOUR REGULAR TOOTHPASTE.   Please read over the following fact sheets that you were given.

## 2018-09-12 ENCOUNTER — Encounter (HOSPITAL_COMMUNITY): Payer: Self-pay

## 2018-09-12 ENCOUNTER — Encounter (HOSPITAL_COMMUNITY)
Admission: RE | Admit: 2018-09-12 | Discharge: 2018-09-12 | Disposition: A | Discharge status: Home / Self Care | Payer: Medicare Other | Source: Ambulatory Visit | Attending: Neurosurgery | Admitting: Neurosurgery

## 2018-09-12 ENCOUNTER — Other Ambulatory Visit: Payer: Self-pay

## 2018-09-12 DIAGNOSIS — Z01818 Encounter for other preprocedural examination: Secondary | ICD-10-CM | POA: Insufficient documentation

## 2018-09-12 DIAGNOSIS — I10 Essential (primary) hypertension: Secondary | ICD-10-CM | POA: Insufficient documentation

## 2018-09-12 HISTORY — DX: Unilateral inguinal hernia, without obstruction or gangrene, not specified as recurrent: K40.90

## 2018-09-12 LAB — CBC
HCT: 52.5 % — ABNORMAL HIGH (ref 39.0–52.0)
Hemoglobin: 17.2 g/dL — ABNORMAL HIGH (ref 13.0–17.0)
MCH: 29.7 pg (ref 26.0–34.0)
MCHC: 32.8 g/dL (ref 30.0–36.0)
MCV: 90.5 fL (ref 80.0–100.0)
Platelets: 260 10*3/uL (ref 150–400)
RBC: 5.8 MIL/uL (ref 4.22–5.81)
RDW: 13 % (ref 11.5–15.5)
WBC: 6.3 10*3/uL (ref 4.0–10.5)
nRBC: 0 % (ref 0.0–0.2)

## 2018-09-12 LAB — TYPE AND SCREEN
ABO/RH(D): A POS
Antibody Screen: NEGATIVE

## 2018-09-12 LAB — COMPREHENSIVE METABOLIC PANEL
ALT: 33 U/L (ref 0–44)
AST: 28 U/L (ref 15–41)
Albumin: 4 g/dL (ref 3.5–5.0)
Alkaline Phosphatase: 48 U/L (ref 38–126)
Anion gap: 12 (ref 5–15)
BUN: 10 mg/dL (ref 6–20)
CO2: 23 mmol/L (ref 22–32)
Calcium: 9.7 mg/dL (ref 8.9–10.3)
Chloride: 102 mmol/L (ref 98–111)
Creatinine, Ser: 0.91 mg/dL (ref 0.61–1.24)
GFR calc Af Amer: >60 mL/min (ref >60)
GFR calc non Af Amer: >60 mL/min (ref >60)
Glucose, Bld: 108 mg/dL — ABNORMAL HIGH (ref 70–99)
Potassium: 3.9 mmol/L (ref 3.5–5.1)
Sodium: 137 mmol/L (ref 135–145)
Total Bilirubin: 0.8 mg/dL (ref 0.3–1.2)
Total Protein: 7.2 g/dL (ref 6.5–8.1)

## 2018-09-12 LAB — SURGICAL PCR SCREEN
MRSA, PCR: NEGATIVE
Staphylococcus aureus: POSITIVE — AB

## 2018-09-12 NOTE — Progress Notes (Signed)
PCP - Dr. Garret Reddish Cardiologist - Dr. Jolyn Nap  Chest x-ray - N/A EKG - 09/12/18 Stress Test - denies ECHO - 2008 Cardiac Cath -denies   Sleep Study - denies  Blood Thinner Instructions: N/A Aspirin Instructions:N/A  Anesthesia review: Yes, heart history.   Patient denies shortness of breath, fever, cough and chest pain at PAT appointment   Patient verbalized understanding of instructions that were given to them at the PAT appointment. Patient was also instructed that they will need to review over the PAT instructions again at home before surgery.

## 2018-09-19 NOTE — Anesthesia Preprocedure Evaluation (Addendum)
Anesthesia Evaluation  Patient identified by MRN, date of birth, ID band Patient awake    Reviewed: Allergy & Precautions, NPO status , Patient's Chart, lab work & pertinent test results  Airway Mallampati: II  TM Distance: >3 FB Neck ROM: Full    Dental  (+) Partial Upper, Lower Dentures   Pulmonary Current Smoker,    Pulmonary exam normal breath sounds clear to auscultation       Cardiovascular hypertension, Normal cardiovascular exam Rhythm:Regular Rate:Normal  ECG: NSR, rate 92  PCP - Dr. Garret Reddish  Cardiologist - Dr. Jolyn Nap  H/O a-fib, s/p ablation   Neuro/Psych negative neurological ROS  negative psych ROS   GI/Hepatic Neg liver ROS, GERD  Medicated,  Endo/Other  negative endocrine ROS  Renal/GU negative Renal ROS     Musculoskeletal Scoliosis Chronic pain syndrome   Abdominal   Peds  Hematology HLD   Anesthesia Other Findings Radiculopathy, Lumbar region  Reproductive/Obstetrics                            Anesthesia Physical Anesthesia Plan  ASA: III  Anesthesia Plan: General   Post-op Pain Management:    Induction: Intravenous  PONV Risk Score and Plan: 2 and Ondansetron, Dexamethasone, Midazolam and Treatment may vary due to age or medical condition  Airway Management Planned: Oral ETT  Additional Equipment: Arterial line  Intra-op Plan:   Post-operative Plan:   Informed Consent:   Plan Discussed with:   Anesthesia Plan Comments: (M-M: acetaminophen, dexamethasone PIV x 2)       Anesthesia Quick Evaluation

## 2018-09-20 ENCOUNTER — Inpatient Hospital Stay (HOSPITAL_COMMUNITY)
Admission: RE | Admit: 2018-09-20 | Discharge: 2018-10-07 | DRG: 454 | Disposition: A | Discharge status: Skilled Nursing Facility | Payer: Medicare Other | Attending: Neurosurgery | Admitting: Neurosurgery

## 2018-09-20 ENCOUNTER — Inpatient Hospital Stay (HOSPITAL_COMMUNITY): Payer: Medicare Other

## 2018-09-20 ENCOUNTER — Inpatient Hospital Stay (HOSPITAL_COMMUNITY): Payer: Medicare Other | Admitting: Certified Registered Nurse Anesthetist

## 2018-09-20 ENCOUNTER — Other Ambulatory Visit: Payer: Self-pay

## 2018-09-20 ENCOUNTER — Encounter (HOSPITAL_COMMUNITY)
Admission: RE | Disposition: A | Discharge status: Skilled Nursing Facility | Payer: Self-pay | Source: Home / Self Care | Attending: Neurosurgery

## 2018-09-20 ENCOUNTER — Encounter (HOSPITAL_COMMUNITY): Payer: Self-pay

## 2018-09-20 DIAGNOSIS — Z981 Arthrodesis status: Secondary | ICD-10-CM

## 2018-09-20 DIAGNOSIS — Z4789 Encounter for other orthopedic aftercare: Secondary | ICD-10-CM | POA: Diagnosis not present

## 2018-09-20 DIAGNOSIS — M5117 Intervertebral disc disorders with radiculopathy, lumbosacral region: Secondary | ICD-10-CM | POA: Diagnosis not present

## 2018-09-20 DIAGNOSIS — Z743 Need for continuous supervision: Secondary | ICD-10-CM | POA: Diagnosis not present

## 2018-09-20 DIAGNOSIS — R52 Pain, unspecified: Secondary | ICD-10-CM | POA: Diagnosis not present

## 2018-09-20 DIAGNOSIS — M4125 Other idiopathic scoliosis, thoracolumbar region: Secondary | ICD-10-CM | POA: Diagnosis present

## 2018-09-20 DIAGNOSIS — M4807 Spinal stenosis, lumbosacral region: Secondary | ICD-10-CM | POA: Diagnosis not present

## 2018-09-20 DIAGNOSIS — M1611 Unilateral primary osteoarthritis, right hip: Secondary | ICD-10-CM | POA: Diagnosis not present

## 2018-09-20 DIAGNOSIS — G894 Chronic pain syndrome: Secondary | ICD-10-CM | POA: Diagnosis not present

## 2018-09-20 DIAGNOSIS — M48062 Spinal stenosis, lumbar region with neurogenic claudication: Secondary | ICD-10-CM | POA: Diagnosis not present

## 2018-09-20 DIAGNOSIS — Z419 Encounter for procedure for purposes other than remedying health state, unspecified: Secondary | ICD-10-CM

## 2018-09-20 DIAGNOSIS — I4891 Unspecified atrial fibrillation: Secondary | ICD-10-CM | POA: Diagnosis not present

## 2018-09-20 DIAGNOSIS — Z79891 Long term (current) use of opiate analgesic: Secondary | ICD-10-CM | POA: Diagnosis not present

## 2018-09-20 DIAGNOSIS — Z87442 Personal history of urinary calculi: Secondary | ICD-10-CM

## 2018-09-20 DIAGNOSIS — Z96641 Presence of right artificial hip joint: Secondary | ICD-10-CM | POA: Diagnosis present

## 2018-09-20 DIAGNOSIS — K219 Gastro-esophageal reflux disease without esophagitis: Secondary | ICD-10-CM | POA: Diagnosis present

## 2018-09-20 DIAGNOSIS — Z79899 Other long term (current) drug therapy: Secondary | ICD-10-CM | POA: Diagnosis not present

## 2018-09-20 DIAGNOSIS — M48061 Spinal stenosis, lumbar region without neurogenic claudication: Secondary | ICD-10-CM | POA: Diagnosis not present

## 2018-09-20 DIAGNOSIS — R2689 Other abnormalities of gait and mobility: Secondary | ICD-10-CM | POA: Diagnosis not present

## 2018-09-20 DIAGNOSIS — N200 Calculus of kidney: Secondary | ICD-10-CM | POA: Diagnosis not present

## 2018-09-20 DIAGNOSIS — I10 Essential (primary) hypertension: Secondary | ICD-10-CM | POA: Diagnosis present

## 2018-09-20 DIAGNOSIS — M4317 Spondylolisthesis, lumbosacral region: Principal | ICD-10-CM | POA: Diagnosis present

## 2018-09-20 DIAGNOSIS — Z9889 Other specified postprocedural states: Secondary | ICD-10-CM

## 2018-09-20 DIAGNOSIS — M6281 Muscle weakness (generalized): Secondary | ICD-10-CM | POA: Diagnosis not present

## 2018-09-20 DIAGNOSIS — M419 Scoliosis, unspecified: Secondary | ICD-10-CM | POA: Diagnosis not present

## 2018-09-20 DIAGNOSIS — Z87891 Personal history of nicotine dependence: Secondary | ICD-10-CM | POA: Diagnosis not present

## 2018-09-20 DIAGNOSIS — M4327 Fusion of spine, lumbosacral region: Secondary | ICD-10-CM | POA: Diagnosis not present

## 2018-09-20 DIAGNOSIS — M5489 Other dorsalgia: Secondary | ICD-10-CM | POA: Diagnosis not present

## 2018-09-20 DIAGNOSIS — M5417 Radiculopathy, lumbosacral region: Secondary | ICD-10-CM | POA: Diagnosis not present

## 2018-09-20 DIAGNOSIS — G960 Cerebrospinal fluid leak: Secondary | ICD-10-CM | POA: Diagnosis not present

## 2018-09-20 DIAGNOSIS — M4310 Spondylolisthesis, site unspecified: Secondary | ICD-10-CM | POA: Diagnosis present

## 2018-09-20 DIAGNOSIS — Z833 Family history of diabetes mellitus: Secondary | ICD-10-CM | POA: Diagnosis not present

## 2018-09-20 DIAGNOSIS — M4326 Fusion of spine, lumbar region: Secondary | ICD-10-CM | POA: Diagnosis not present

## 2018-09-20 DIAGNOSIS — Z803 Family history of malignant neoplasm of breast: Secondary | ICD-10-CM

## 2018-09-20 DIAGNOSIS — G970 Cerebrospinal fluid leak from spinal puncture: Secondary | ICD-10-CM | POA: Diagnosis not present

## 2018-09-20 DIAGNOSIS — Z8249 Family history of ischemic heart disease and other diseases of the circulatory system: Secondary | ICD-10-CM

## 2018-09-20 DIAGNOSIS — M532X6 Spinal instabilities, lumbar region: Secondary | ICD-10-CM | POA: Diagnosis not present

## 2018-09-20 DIAGNOSIS — E785 Hyperlipidemia, unspecified: Secondary | ICD-10-CM | POA: Diagnosis present

## 2018-09-20 DIAGNOSIS — F17210 Nicotine dependence, cigarettes, uncomplicated: Secondary | ICD-10-CM | POA: Diagnosis not present

## 2018-09-20 DIAGNOSIS — G9782 Other postprocedural complications and disorders of nervous system: Secondary | ICD-10-CM | POA: Diagnosis not present

## 2018-09-20 DIAGNOSIS — R279 Unspecified lack of coordination: Secondary | ICD-10-CM | POA: Diagnosis not present

## 2018-09-20 DIAGNOSIS — G9389 Other specified disorders of brain: Secondary | ICD-10-CM | POA: Diagnosis not present

## 2018-09-20 HISTORY — PX: APPLICATION OF ROBOTIC ASSISTANCE FOR SPINAL PROCEDURE: SHX6753

## 2018-09-20 HISTORY — PX: ABDOMINAL EXPOSURE: SHX5708

## 2018-09-20 HISTORY — PX: ANTERIOR LUMBAR FUSION: SHX1170

## 2018-09-20 HISTORY — PX: POSTERIOR LUMBAR FUSION 4 LEVEL: SHX6037

## 2018-09-20 SURGERY — ANTERIOR LUMBAR FUSION 1 LEVEL
Anesthesia: General | Site: Back

## 2018-09-20 MED ORDER — ESMOLOL HCL 100 MG/10ML IV SOLN
INTRAVENOUS | Status: DC | PRN
  Administered 2018-09-20: 20 mg via INTRAVENOUS
  Administered 2018-09-20: 40 mg via INTRAVENOUS
  Administered 2018-09-20: 30 mg via INTRAVENOUS

## 2018-09-20 MED ORDER — DIAZEPAM 5 MG PO TABS
ORAL_TABLET | ORAL | Status: AC
  Filled 2018-09-20: qty 1

## 2018-09-20 MED ORDER — SODIUM CHLORIDE 0.9% FLUSH
3.0000 mL | Freq: Two times a day (BID) | INTRAVENOUS | Status: DC
  Administered 2018-09-21 – 2018-10-04 (×25): 3 mL via INTRAVENOUS

## 2018-09-20 MED ORDER — FENTANYL CITRATE (PF) 250 MCG/5ML IJ SOLN
INTRAMUSCULAR | Status: DC | PRN
  Administered 2018-09-20: 50 ug via INTRAVENOUS
  Administered 2018-09-20: 100 ug via INTRAVENOUS
  Administered 2018-09-20 (×6): 50 ug via INTRAVENOUS
  Administered 2018-09-20: 100 ug via INTRAVENOUS

## 2018-09-20 MED ORDER — CEFAZOLIN SODIUM 1 G IJ SOLR
INTRAMUSCULAR | Status: AC
  Filled 2018-09-20: qty 20

## 2018-09-20 MED ORDER — SODIUM CHLORIDE 0.9 % IV SOLN
250.0000 mL | INTRAVENOUS | Status: DC
  Administered 2018-09-30 – 2018-10-04 (×2): 250 mL via INTRAVENOUS

## 2018-09-20 MED ORDER — PROPOFOL 10 MG/ML IV BOLUS
INTRAVENOUS | Status: DC | PRN
  Administered 2018-09-20: 200 mg via INTRAVENOUS

## 2018-09-20 MED ORDER — THROMBIN 5000 UNITS EX SOLR
OROMUCOSAL | Status: DC | PRN
  Administered 2018-09-20: 15:00:00

## 2018-09-20 MED ORDER — OXYCODONE HCL 5 MG PO TABS: 5.0000 mg | ORAL_TABLET | Freq: Once | ORAL | Status: DC | PRN

## 2018-09-20 MED ORDER — FENTANYL CITRATE (PF) 250 MCG/5ML IJ SOLN
INTRAMUSCULAR | Status: AC
  Filled 2018-09-20: qty 5

## 2018-09-20 MED ORDER — HYDROMORPHONE HCL 1 MG/ML IJ SOLN
0.2500 mg | INTRAMUSCULAR | Status: DC | PRN
  Administered 2018-09-20 (×3): 0.5 mg via INTRAVENOUS

## 2018-09-20 MED ORDER — KETAMINE HCL 50 MG/5ML IJ SOSY
PREFILLED_SYRINGE | INTRAMUSCULAR | Status: AC
  Filled 2018-09-20: qty 15

## 2018-09-20 MED ORDER — THROMBIN 20000 UNITS EX SOLR
CUTANEOUS | Status: AC
  Filled 2018-09-20: qty 20000

## 2018-09-20 MED ORDER — ACETAMINOPHEN 10 MG/ML IV SOLN
INTRAVENOUS | Status: DC | PRN
  Administered 2018-09-20: 1000 mg via INTRAVENOUS

## 2018-09-20 MED ORDER — ESMOLOL HCL 100 MG/10ML IV SOLN
INTRAVENOUS | Status: AC
  Filled 2018-09-20: qty 10

## 2018-09-20 MED ORDER — LACTATED RINGERS IV SOLN
INTRAVENOUS | Status: DC
  Administered 2018-09-21 (×2): via INTRAVENOUS

## 2018-09-20 MED ORDER — VANCOMYCIN HCL 1000 MG IV SOLR
INTRAVENOUS | Status: AC
  Filled 2018-09-20: qty 1000

## 2018-09-20 MED ORDER — THROMBIN 20000 UNITS EX SOLR
CUTANEOUS | Status: DC | PRN
  Administered 2018-09-20: 10:00:00

## 2018-09-20 MED ORDER — BUPIVACAINE HCL (PF) 0.25 % IJ SOLN
INTRAMUSCULAR | Status: AC
  Filled 2018-09-20: qty 30

## 2018-09-20 MED ORDER — CHLORHEXIDINE GLUCONATE 4 % EX LIQD: 60.0000 mL | Freq: Once | CUTANEOUS | Status: DC

## 2018-09-20 MED ORDER — LABETALOL HCL 5 MG/ML IV SOLN
INTRAVENOUS | Status: AC
  Filled 2018-09-20: qty 4

## 2018-09-20 MED ORDER — SODIUM CHLORIDE 0.9 % IV SOLN
INTRAVENOUS | Status: DC | PRN
  Administered 2018-09-20: 15:00:00 via INTRAVENOUS

## 2018-09-20 MED ORDER — PROMETHAZINE HCL 25 MG/ML IJ SOLN: 6.2500 mg | INTRAMUSCULAR | Status: DC | PRN

## 2018-09-20 MED ORDER — LABETALOL HCL 5 MG/ML IV SOLN
INTRAVENOUS | Status: DC | PRN
  Administered 2018-09-20: 10 mg via INTRAVENOUS
  Administered 2018-09-20 (×2): 5 mg via INTRAVENOUS

## 2018-09-20 MED ORDER — HYDROCODONE-ACETAMINOPHEN 10-325 MG PO TABS
1.0000 | ORAL_TABLET | ORAL | Status: DC | PRN
  Administered 2018-09-20 – 2018-10-07 (×53): 1 via ORAL
  Filled 2018-09-20 (×55): qty 1

## 2018-09-20 MED ORDER — DEXMEDETOMIDINE HCL 200 MCG/2ML IV SOLN
INTRAVENOUS | Status: DC | PRN
  Administered 2018-09-20: 40 ug via INTRAVENOUS

## 2018-09-20 MED ORDER — SODIUM CHLORIDE 0.9 % IV SOLN
INTRAVENOUS | Status: DC | PRN
  Administered 2018-09-20: 500 mL

## 2018-09-20 MED ORDER — PROPOFOL 10 MG/ML IV BOLUS
INTRAVENOUS | Status: AC
  Filled 2018-09-20: qty 40

## 2018-09-20 MED ORDER — ROCURONIUM BROMIDE 50 MG/5ML IV SOSY
PREFILLED_SYRINGE | INTRAVENOUS | Status: DC | PRN
  Administered 2018-09-20 (×6): 50 mg via INTRAVENOUS

## 2018-09-20 MED ORDER — MIDAZOLAM HCL 2 MG/2ML IJ SOLN
INTRAMUSCULAR | Status: DC | PRN
  Administered 2018-09-20: 2 mg via INTRAVENOUS

## 2018-09-20 MED ORDER — PANTOPRAZOLE SODIUM 40 MG PO TBEC
80.0000 mg | DELAYED_RELEASE_TABLET | Freq: Every day | ORAL | Status: DC
  Administered 2018-09-21 – 2018-10-07 (×17): 80 mg via ORAL
  Filled 2018-09-20 (×17): qty 2

## 2018-09-20 MED ORDER — ONDANSETRON HCL 4 MG/2ML IJ SOLN
INTRAMUSCULAR | Status: DC | PRN
  Administered 2018-09-20 (×2): 4 mg via INTRAVENOUS

## 2018-09-20 MED ORDER — VANCOMYCIN HCL 1 G IV SOLR
INTRAVENOUS | Status: DC | PRN
  Administered 2018-09-20: 1000 mg

## 2018-09-20 MED ORDER — DIAZEPAM 5 MG PO TABS
5.0000 mg | ORAL_TABLET | Freq: Four times a day (QID) | ORAL | Status: DC | PRN
  Administered 2018-09-20 (×2): 5 mg via ORAL
  Administered 2018-09-21 – 2018-10-01 (×22): 10 mg via ORAL
  Administered 2018-10-01 – 2018-10-02 (×2): 5 mg via ORAL
  Administered 2018-10-02 – 2018-10-07 (×16): 10 mg via ORAL
  Filled 2018-09-20 (×9): qty 2
  Filled 2018-09-20: qty 1
  Filled 2018-09-20 (×32): qty 2

## 2018-09-20 MED ORDER — CHLORHEXIDINE GLUCONATE CLOTH 2 % EX PADS: 6.0000 | MEDICATED_PAD | Freq: Once | CUTANEOUS | Status: DC

## 2018-09-20 MED ORDER — MENTHOL 3 MG MT LOZG
1.0000 | LOZENGE | OROMUCOSAL | Status: DC | PRN
  Filled 2018-09-20: qty 9

## 2018-09-20 MED ORDER — CEFAZOLIN SODIUM-DEXTROSE 1-4 GM/50ML-% IV SOLN
1.0000 g | Freq: Three times a day (TID) | INTRAVENOUS | Status: AC
  Administered 2018-09-20 – 2018-09-21 (×2): 1 g via INTRAVENOUS
  Filled 2018-09-20 (×2): qty 50

## 2018-09-20 MED ORDER — LIDOCAINE 2% (20 MG/ML) 5 ML SYRINGE
INTRAMUSCULAR | Status: AC
  Filled 2018-09-20: qty 5

## 2018-09-20 MED ORDER — PHENYLEPHRINE HCL 10 MG/ML IJ SOLN
INTRAMUSCULAR | Status: AC
  Filled 2018-09-20: qty 1

## 2018-09-20 MED ORDER — POLYETHYLENE GLYCOL 3350 17 G PO PACK
17.0000 g | PACK | Freq: Every day | ORAL | Status: DC | PRN
  Administered 2018-10-01: 17 g via ORAL
  Filled 2018-09-20 (×2): qty 1

## 2018-09-20 MED ORDER — SODIUM CHLORIDE 0.9% FLUSH
3.0000 mL | INTRAVENOUS | Status: DC | PRN
  Administered 2018-09-25: 3 mL via INTRAVENOUS
  Filled 2018-09-20: qty 3

## 2018-09-20 MED ORDER — HYDROMORPHONE HCL 1 MG/ML IJ SOLN
INTRAMUSCULAR | Status: AC
  Filled 2018-09-20: qty 2

## 2018-09-20 MED ORDER — ONDANSETRON HCL 4 MG PO TABS
4.0000 mg | ORAL_TABLET | Freq: Four times a day (QID) | ORAL | Status: DC | PRN
  Administered 2018-09-25: 4 mg via ORAL
  Filled 2018-09-20: qty 1

## 2018-09-20 MED ORDER — ESOMEPRAZOLE MAGNESIUM 40 MG PO PACK: 40.0000 mg | PACK | Freq: Every day | ORAL | Status: DC

## 2018-09-20 MED ORDER — OXYCODONE HCL 10 MG PO TABS: 10.0000 mg | ORAL_TABLET | Freq: Four times a day (QID) | ORAL | Status: DC | PRN

## 2018-09-20 MED ORDER — LOPERAMIDE HCL 2 MG PO CAPS: 2.0000 mg | ORAL_CAPSULE | Freq: Three times a day (TID) | ORAL | Status: DC | PRN

## 2018-09-20 MED ORDER — THROMBIN 5000 UNITS EX SOLR
CUTANEOUS | Status: AC
  Filled 2018-09-20: qty 5000

## 2018-09-20 MED ORDER — ALBUMIN HUMAN 5 % IV SOLN
INTRAVENOUS | Status: DC | PRN
  Administered 2018-09-20: 12:00:00 via INTRAVENOUS

## 2018-09-20 MED ORDER — LIDOCAINE IN D5W 4-5 MG/ML-% IV SOLN
1.0000 mg/min | INTRAVENOUS | Status: DC
  Administered 2018-09-20: 25 ug/kg/min via INTRAVENOUS
  Filled 2018-09-20: qty 500

## 2018-09-20 MED ORDER — ACETAMINOPHEN 500 MG PO TABS
1000.0000 mg | ORAL_TABLET | Freq: Once | ORAL | Status: DC
  Filled 2018-09-20: qty 2

## 2018-09-20 MED ORDER — LIDOCAINE 2% (20 MG/ML) 5 ML SYRINGE
INTRAMUSCULAR | Status: DC | PRN
  Administered 2018-09-20: 40 mg via INTRAVENOUS

## 2018-09-20 MED ORDER — SODIUM CHLORIDE 0.9 % IV BOLUS
500.0000 mL | Freq: Once | INTRAVENOUS | Status: AC
  Administered 2018-09-20: 500 mL via INTRAVENOUS

## 2018-09-20 MED ORDER — HYDROMORPHONE HCL 1 MG/ML IJ SOLN
1.0000 mg | INTRAMUSCULAR | Status: DC | PRN
  Administered 2018-09-20 – 2018-10-07 (×85): 1 mg via INTRAVENOUS
  Filled 2018-09-20 (×88): qty 1

## 2018-09-20 MED ORDER — ACETAMINOPHEN 650 MG RE SUPP: 650.0000 mg | RECTAL | Status: DC | PRN

## 2018-09-20 MED ORDER — BUPIVACAINE HCL (PF) 0.25 % IJ SOLN
INTRAMUSCULAR | Status: DC | PRN
  Administered 2018-09-20: 10 mL

## 2018-09-20 MED ORDER — OXYCODONE HCL 5 MG/5ML PO SOLN: 5.0000 mg | Freq: Once | ORAL | Status: DC | PRN

## 2018-09-20 MED ORDER — LACTATED RINGERS IV SOLN
INTRAVENOUS | Status: DC | PRN
  Administered 2018-09-20 (×3): via INTRAVENOUS

## 2018-09-20 MED ORDER — SODIUM CHLORIDE 0.9 % IV SOLN
INTRAVENOUS | Status: DC | PRN
  Administered 2018-09-20: 35 ug/min via INTRAVENOUS

## 2018-09-20 MED ORDER — DEXAMETHASONE SODIUM PHOSPHATE 10 MG/ML IJ SOLN
INTRAMUSCULAR | Status: DC | PRN
  Administered 2018-09-20: 5 mg via INTRAVENOUS

## 2018-09-20 MED ORDER — CEFAZOLIN SODIUM-DEXTROSE 2-4 GM/100ML-% IV SOLN
2.0000 g | INTRAVENOUS | Status: AC
  Administered 2018-09-20 (×3): 2 g via INTRAVENOUS
  Filled 2018-09-20: qty 100

## 2018-09-20 MED ORDER — OXYCODONE HCL 5 MG PO TABS
ORAL_TABLET | ORAL | Status: AC
  Filled 2018-09-20: qty 2

## 2018-09-20 MED ORDER — SUGAMMADEX SODIUM 200 MG/2ML IV SOLN
INTRAVENOUS | Status: DC | PRN
  Administered 2018-09-20: 200 mg via INTRAVENOUS

## 2018-09-20 MED ORDER — ONDANSETRON HCL 4 MG/2ML IJ SOLN
INTRAMUSCULAR | Status: AC
  Filled 2018-09-20: qty 2

## 2018-09-20 MED ORDER — BISACODYL 10 MG RE SUPP
10.0000 mg | Freq: Every day | RECTAL | Status: DC | PRN
  Administered 2018-10-01: 10 mg via RECTAL
  Filled 2018-09-20 (×2): qty 1

## 2018-09-20 MED ORDER — ACETAMINOPHEN 325 MG PO TABS
650.0000 mg | ORAL_TABLET | ORAL | Status: DC | PRN
  Administered 2018-09-20 – 2018-10-05 (×12): 650 mg via ORAL
  Filled 2018-09-20 (×13): qty 2

## 2018-09-20 MED ORDER — OXYCODONE HCL 5 MG PO TABS
10.0000 mg | ORAL_TABLET | ORAL | Status: DC | PRN
  Administered 2018-09-20 – 2018-10-07 (×80): 10 mg via ORAL
  Filled 2018-09-20 (×80): qty 2

## 2018-09-20 MED ORDER — 0.9 % SODIUM CHLORIDE (POUR BTL) OPTIME
TOPICAL | Status: DC | PRN
  Administered 2018-09-20: 1000 mL

## 2018-09-20 MED ORDER — KETAMINE HCL 10 MG/ML IJ SOLN
INTRAMUSCULAR | Status: DC | PRN
  Administered 2018-09-20: 10 mg via INTRAVENOUS
  Administered 2018-09-20: 50 mg via INTRAVENOUS
  Administered 2018-09-20 (×4): 10 mg via INTRAVENOUS

## 2018-09-20 MED ORDER — DIPHENHYDRAMINE HCL 25 MG PO CAPS
25.0000 mg | ORAL_CAPSULE | Freq: Every evening | ORAL | Status: DC | PRN
  Administered 2018-09-21 – 2018-09-30 (×6): 25 mg via ORAL
  Filled 2018-09-20 (×6): qty 1

## 2018-09-20 MED ORDER — MIDAZOLAM HCL 2 MG/2ML IJ SOLN
INTRAMUSCULAR | Status: AC
  Filled 2018-09-20: qty 2

## 2018-09-20 MED ORDER — ONDANSETRON HCL 4 MG/2ML IJ SOLN
4.0000 mg | Freq: Four times a day (QID) | INTRAMUSCULAR | Status: DC | PRN
  Administered 2018-09-24: 4 mg via INTRAVENOUS
  Filled 2018-09-20: qty 2

## 2018-09-20 MED ORDER — FLEET ENEMA 7-19 GM/118ML RE ENEM
1.0000 | ENEMA | Freq: Once | RECTAL | Status: AC | PRN
  Administered 2018-10-02: 1 via RECTAL
  Filled 2018-09-20: qty 1

## 2018-09-20 MED ORDER — ROCURONIUM BROMIDE 50 MG/5ML IV SOSY
PREFILLED_SYRINGE | INTRAVENOUS | Status: AC
  Filled 2018-09-20: qty 25

## 2018-09-20 MED ORDER — PHENYLEPHRINE HCL 10 MG/ML IJ SOLN
INTRAMUSCULAR | Status: DC | PRN
  Administered 2018-09-20: 80 ug via INTRAVENOUS
  Administered 2018-09-20: 120 ug via INTRAVENOUS

## 2018-09-20 MED ORDER — PHENOL 1.4 % MT LIQD
1.0000 | OROMUCOSAL | Status: DC | PRN
  Filled 2018-09-20: qty 177

## 2018-09-20 MED ORDER — ACETAMINOPHEN 10 MG/ML IV SOLN
INTRAVENOUS | Status: AC
  Filled 2018-09-20: qty 100

## 2018-09-20 MED ORDER — ADULT MULTIVITAMIN W/MINERALS CH
1.0000 | ORAL_TABLET | Freq: Every day | ORAL | Status: DC
  Administered 2018-09-21 – 2018-10-07 (×17): 1 via ORAL
  Filled 2018-09-20 (×17): qty 1

## 2018-09-20 SURGICAL SUPPLY — 136 items
ADH SKN CLS APL DERMABOND .7 (GAUZE/BANDAGES/DRESSINGS) ×4
ANCH SPNL 27 LMBR MIS (Anchor) ×6 IMPLANT
ANCHOR LUMBAR 27 (Anchor) ×3 IMPLANT
ANCHOR LUMBAR 27MM (Anchor) ×3 IMPLANT
APL SKNCLS STERI-STRIP NONHPOA (GAUZE/BANDAGES/DRESSINGS) ×4
APPLIER CLIP 11 MED OPEN (CLIP)
APR CLP MED 11 20 MLT OPN (CLIP)
BAG DECANTER FOR FLEXI CONT (MISCELLANEOUS) ×6 IMPLANT
BASKET BONE COLLECTION (BASKET) ×2 IMPLANT
BENZOIN TINCTURE PRP APPL 2/3 (GAUZE/BANDAGES/DRESSINGS) ×6 IMPLANT
BIT DRILL LONG 3.0X30 (BIT) ×1 IMPLANT
BIT DRILL LONG 3.0X30MM (BIT) ×1
BIT DRILL LONG 3X80 (BIT) IMPLANT
BIT DRILL LONG 3X80MM (BIT)
BIT DRILL LONG 4X80 (BIT) ×1 IMPLANT
BIT DRILL LONG 4X80MM (BIT) ×1
BIT DRILL SHORT 3.0X30 (BIT) IMPLANT
BIT DRILL SHORT 3.0X30MM (BIT)
BIT DRILL SHORT 3X80 (BIT) IMPLANT
BIT DRILL SHORT 3X80MM (BIT)
BLADE CLIPPER SURG (BLADE) IMPLANT
BLADE SURG 11 STRL SS (BLADE) ×4 IMPLANT
BUR CUTTER 7.0 ROUND (BURR) ×2 IMPLANT
BUR MATCHSTICK NEURO 3.0 LAGG (BURR) ×8 IMPLANT
CANISTER SUCT 3000ML PPV (MISCELLANEOUS) ×6 IMPLANT
CARTRIDGE OIL MAESTRO DRILL (MISCELLANEOUS) ×4 IMPLANT
CLIP APPLIE 11 MED OPEN (CLIP) ×4 IMPLANT
CLOSURE WOUND 1/2 X4 (GAUZE/BANDAGES/DRESSINGS) ×1
CONT SPEC 4OZ CLIKSEAL STRL BL (MISCELLANEOUS) ×6 IMPLANT
COVER BACK TABLE 60X90IN (DRAPES) ×6 IMPLANT
COVER WAND RF STERILE (DRAPES) ×8 IMPLANT
DECANTER SPIKE VIAL GLASS SM (MISCELLANEOUS) ×2 IMPLANT
DERMABOND ADVANCED (GAUZE/BANDAGES/DRESSINGS) ×4
DERMABOND ADVANCED .7 DNX12 (GAUZE/BANDAGES/DRESSINGS) ×4 IMPLANT
DIFFUSER DRILL AIR PNEUMATIC (MISCELLANEOUS) ×6 IMPLANT
DRAPE C-ARM 42X72 X-RAY (DRAPES) ×16 IMPLANT
DRAPE HALF SHEET 40X57 (DRAPES) IMPLANT
DRAPE INCISE IOBAN 66X45 STRL (DRAPES) ×4 IMPLANT
DRAPE LAPAROTOMY 100X72X124 (DRAPES) ×8 IMPLANT
DRAPE SHEET LG 3/4 BI-LAMINATE (DRAPES) ×4 IMPLANT
DRAPE SURG 17X23 STRL (DRAPES) ×12 IMPLANT
DRSG OPSITE POSTOP 4X10 (GAUZE/BANDAGES/DRESSINGS) ×2 IMPLANT
DRSG OPSITE POSTOP 4X6 (GAUZE/BANDAGES/DRESSINGS) ×2 IMPLANT
DRSG OPSITE POSTOP 4X8 (GAUZE/BANDAGES/DRESSINGS) ×2 IMPLANT
DURAPREP 26ML APPLICATOR (WOUND CARE) ×6 IMPLANT
ELECT BLADE 4.0 EZ CLEAN MEGAD (MISCELLANEOUS) ×4
ELECT BLADE 6.5 EXT (BLADE) ×4 IMPLANT
ELECT REM PT RETURN 9FT ADLT (ELECTROSURGICAL) ×8
ELECTRODE BLDE 4.0 EZ CLN MEGD (MISCELLANEOUS) IMPLANT
ELECTRODE REM PT RTRN 9FT ADLT (ELECTROSURGICAL) ×4 IMPLANT
EVACUATOR 1/8 PVC DRAIN (DRAIN) IMPLANT
GAUZE 4X4 16PLY RFD (DISPOSABLE) ×2 IMPLANT
GAUZE SPONGE 4X4 12PLY STRL (GAUZE/BANDAGES/DRESSINGS) ×4 IMPLANT
GLOVE BIO SURGEON STRL SZ 6.5 (GLOVE) ×4 IMPLANT
GLOVE BIO SURGEONS STRL SZ 6.5 (GLOVE) ×4
GLOVE BIOGEL PI IND STRL 6.5 (GLOVE) IMPLANT
GLOVE BIOGEL PI IND STRL 7.0 (GLOVE) IMPLANT
GLOVE BIOGEL PI IND STRL 8 (GLOVE) ×2 IMPLANT
GLOVE BIOGEL PI INDICATOR 6.5 (GLOVE) ×4
GLOVE BIOGEL PI INDICATOR 7.0 (GLOVE) ×2
GLOVE BIOGEL PI INDICATOR 8 (GLOVE) ×2
GLOVE ECLIPSE 7.5 STRL STRAW (GLOVE) ×4 IMPLANT
GLOVE ECLIPSE 9.0 STRL (GLOVE) ×12 IMPLANT
GLOVE SURG SS PI 7.5 STRL IVOR (GLOVE) ×6 IMPLANT
GOWN STRL REUS W/ TWL LRG LVL3 (GOWN DISPOSABLE) ×8 IMPLANT
GOWN STRL REUS W/ TWL XL LVL3 (GOWN DISPOSABLE) ×6 IMPLANT
GOWN STRL REUS W/TWL 2XL LVL3 (GOWN DISPOSABLE) IMPLANT
GOWN STRL REUS W/TWL LRG LVL3 (GOWN DISPOSABLE) ×16
GOWN STRL REUS W/TWL XL LVL3 (GOWN DISPOSABLE) ×12
GRAFT DURAGEN MATRIX 2WX2L ×2 IMPLANT
GUIDEWIRE 18IN BLUNT CD HORIZ (WIRE) ×12 IMPLANT
INSERT FOGARTY 61MM (MISCELLANEOUS) IMPLANT
INSERT FOGARTY SM (MISCELLANEOUS) IMPLANT
KIT BASIN OR (CUSTOM PROCEDURE TRAY) ×8 IMPLANT
KIT INFUSE X SMALL 1.4CC (Orthopedic Implant) ×2 IMPLANT
KIT SPINE MAZOR X ROBO DISP (MISCELLANEOUS) ×4 IMPLANT
KIT TURNOVER KIT B (KITS) ×6 IMPLANT
LOOP VESSEL MAXI BLUE (MISCELLANEOUS) IMPLANT
LOOP VESSEL MINI RED (MISCELLANEOUS) IMPLANT
MILL MEDIUM DISP (BLADE) ×4 IMPLANT
NDL SPNL 18GX3.5 QUINCKE PK (NEEDLE) ×2 IMPLANT
NEEDLE HYPO 22GX1.5 SAFETY (NEEDLE) ×6 IMPLANT
NEEDLE SPNL 18GX3.5 QUINCKE PK (NEEDLE) ×4 IMPLANT
NS IRRIG 1000ML POUR BTL (IV SOLUTION) ×8 IMPLANT
OIL CARTRIDGE MAESTRO DRILL (MISCELLANEOUS) ×4
PACK LAMINECTOMY NEURO (CUSTOM PROCEDURE TRAY) ×8 IMPLANT
PAD ARMBOARD 7.5X6 YLW CONV (MISCELLANEOUS) ×8 IMPLANT
PENCIL BUTTON HOLSTER BLD 10FT (ELECTRODE) ×2 IMPLANT
PIN HEAD 2.5X60MM (PIN) IMPLANT
PUTTY DBX 5CC (Putty) ×2 IMPLANT
RASP 3.0MM (RASP) ×2 IMPLANT
ROD 5.5MM SPINAL SOLERA (Rod) ×2 IMPLANT
SCREW CANN MA SOLERA 8.5X70 (Screw) ×4 IMPLANT
SCREW FNS SOLERA 7.5X45 (Screw) ×4 IMPLANT
SCREW SCHANZ SA 4.0MM (MISCELLANEOUS) ×2 IMPLANT
SCREW SET SOLERA (Screw) ×40 IMPLANT
SCREW SET SOLERA TI5.5 (Screw) IMPLANT
SCREW SOLERA 5.5/6.0 7.5X40 (Screw) ×12 IMPLANT
SEALANT ADHERUS EXTEND TIP (MISCELLANEOUS) ×2 IMPLANT
SPACER INDEP MIS 29X39 20D 15 (Spacer) ×2 IMPLANT
SPONGE INTESTINAL PEANUT (DISPOSABLE) ×10 IMPLANT
SPONGE LAP 18X18 X RAY DECT (DISPOSABLE) IMPLANT
SPONGE LAP 4X18 RFD (DISPOSABLE) IMPLANT
SPONGE SURGIFOAM ABS GEL 100 (HEMOSTASIS) ×8 IMPLANT
STAPLER VISISTAT (STAPLE) IMPLANT
STAPLER VISISTAT 35W (STAPLE) ×4 IMPLANT
STRIP CLOSURE SKIN 1/2X4 (GAUZE/BANDAGES/DRESSINGS) ×5 IMPLANT
SUT ETHILON 2 0 PSLX (SUTURE) ×2 IMPLANT
SUT PROLENE 4 0 RB 1 (SUTURE)
SUT PROLENE 4-0 RB1 .5 CRCL 36 (SUTURE) IMPLANT
SUT PROLENE 5 0 CC1 (SUTURE) IMPLANT
SUT PROLENE 6 0 C 1 30 (SUTURE) ×4 IMPLANT
SUT PROLENE 6 0 CC (SUTURE) IMPLANT
SUT SILK 0 TIES 10X30 (SUTURE) ×4 IMPLANT
SUT SILK 2 0 TIES 10X30 (SUTURE) ×4 IMPLANT
SUT SILK 2 0SH CR/8 30 (SUTURE) IMPLANT
SUT SILK 3 0 SH CR/8 (SUTURE) IMPLANT
SUT SILK 3 0 TIES 10X30 (SUTURE) ×4 IMPLANT
SUT SILK 3 0SH CR/8 30 (SUTURE) IMPLANT
SUT VIC AB 0 CT1 18XCR BRD8 (SUTURE) ×6 IMPLANT
SUT VIC AB 0 CT1 27 (SUTURE)
SUT VIC AB 0 CT1 27XBRD ANBCTR (SUTURE) ×6 IMPLANT
SUT VIC AB 0 CT1 27XBRD ANTBC (SUTURE) IMPLANT
SUT VIC AB 0 CT1 8-18 (SUTURE) ×16
SUT VIC AB 2-0 CT1 18 (SUTURE) ×14 IMPLANT
SUT VIC AB 2-0 CTB1 (SUTURE) ×4 IMPLANT
SUT VIC AB 3-0 SH 27 (SUTURE) ×8
SUT VIC AB 3-0 SH 27X BRD (SUTURE) ×2 IMPLANT
SUT VIC AB 3-0 SH 8-18 (SUTURE) ×10 IMPLANT
SUT VICRYL 4-0 PS2 18IN ABS (SUTURE) ×2 IMPLANT
SYR BULB 3OZ (MISCELLANEOUS) ×2 IMPLANT
TOWEL GREEN STERILE (TOWEL DISPOSABLE) ×10 IMPLANT
TOWEL GREEN STERILE FF (TOWEL DISPOSABLE) ×8 IMPLANT
TRAY FOLEY MTR SLVR 16FR STAT (SET/KITS/TRAYS/PACK) ×6 IMPLANT
TUBE MAZOR SA REDUCTION (TUBING) ×4 IMPLANT
WATER STERILE IRR 1000ML POUR (IV SOLUTION) ×6 IMPLANT

## 2018-09-20 NOTE — Progress Notes (Signed)
   VASCULAR SURGERY POSTOP:   Doing well postop. Biphasic dorsalis pedis and posterior tibial signal left foot.   SUBJECTIVE:   Pain well controlled.  PHYSICAL EXAM:   Vitals:   09/20/18 1650 09/20/18 1700 09/20/18 1705 09/20/18 1711  BP: 131/82  (!) 129/91   Pulse:  (!) 111  (!) 111  Resp:  16  18  Temp:      TempSrc:      SpO2:  98%  100%  Weight:      Height:       Biphasic left dorsalis pedis and posterior tibial signal.  LABS:    PROBLEM LIST:    Active Problems:   Status post spinal surgery   Degenerative spondylolisthesis   CURRENT MEDS:   . acetaminophen  1,000 mg Oral Once  . chlorhexidine  60 mL Topical Once   And  . [START ON 09/21/2018] chlorhexidine  60 mL Topical Once  . Chlorhexidine Gluconate Cloth  6 each Topical Once   And  . Chlorhexidine Gluconate Cloth  6 each Topical Once  . diazepam      . HYDROmorphone      . oxyCODONE        [name] White Beeper: [phone] Office: [phone] 09/20/2018

## 2018-09-20 NOTE — Anesthesia Procedure Notes (Signed)
Procedure Name: Intubation Date/Time: 09/20/2018 8:08 AM Performed by: Kathryne Hitch, CRNA Pre-anesthesia Checklist: Emergency Drugs available, Patient identified, Suction available, Patient being monitored and Timeout performed Patient Re-evaluated:Patient Re-evaluated prior to induction Oxygen Delivery Method: Circle system utilized Preoxygenation: Pre-oxygenation with 100% oxygen Induction Type: IV induction Ventilation: Mask ventilation without difficulty Laryngoscope Size: Miller and 2 Grade View: Grade I Tube type: Oral Tube size: 7.5 mm Number of attempts: 1 Airway Equipment and Method: Stylet Placement Confirmation: ETT inserted through vocal cords under direct vision,  positive ETCO2 and breath sounds checked- equal and bilateral Secured at: 25 cm Tube secured with: Tape Dental Injury: Teeth and Oropharynx as per pre-operative assessment

## 2018-09-20 NOTE — Anesthesia Postprocedure Evaluation (Signed)
Anesthesia Post Note  Patient: Pervis Macintyre.  Procedure(s) Performed: Lumbar 5 Sacral 1 Anterior lumbar interbody fusion with Lumbar 3 to Iliac fusion with pedicle screws/Lumbar 2-3 Decompressive laminectomy (N/A ) POSTERIOR LUMBAR FUSION 4 LEVEL (N/A Back) APPLICATION OF ROBOTIC ASSISTANCE FOR SPINAL PROCEDURE (N/A ) ABDOMINAL EXPOSURE (N/A )     Patient location during evaluation: PACU Anesthesia Type: General Level of consciousness: awake and alert Pain management: pain level controlled Vital Signs Assessment: post-procedure vital signs reviewed and stable Respiratory status: spontaneous breathing, nonlabored ventilation, respiratory function stable and patient connected to nasal cannula oxygen Cardiovascular status: blood pressure returned to baseline and stable Postop Assessment: no apparent nausea or vomiting Anesthetic complications: no    Last Vitals:  Vitals:   09/20/18 1745 09/20/18 1751  BP:  127/82  Pulse: (!) 111 (!) 110  Resp: 15 14  Temp:  36.5 C  SpO2: 96% 97%    Last Pain:  Vitals:   09/20/18 1751  TempSrc:   PainSc: Asleep                 Ryan P Ellender

## 2018-09-20 NOTE — Brief Op Note (Signed)
09/20/2018  4:18 PM  PATIENT:  [name] White.  53 y.o. male  PRE-OPERATIVE DIAGNOSIS:  Radiculopathy, Lumbar region  POST-OPERATIVE DIAGNOSIS:  Radiculopathy, Lumbar region  PROCEDURE:  Procedure(s) with comments: Lumbar 5 Sacral 1 Anterior lumbar interbody fusion with Lumbar 3 to Iliac fusion with pedicle screws/Lumbar 2-3 Decompressive laminectomy (N/A) - Lumbar 5 Sacral 1 Anterior lumbar interbody fusion with Lumbar 3 to Iliac fusion with pedicle screws/Lumbar 2-3 Decompressive laminectomy POSTERIOR LUMBAR FUSION 4 LEVEL (N/A) APPLICATION OF ROBOTIC ASSISTANCE FOR SPINAL PROCEDURE (N/A) ABDOMINAL EXPOSURE (N/A)  SURGEON:  Surgeon(s) and Role: Panel 1:    * Earnie Larsson, MD - Primary    Kristeen Miss, MD - Assisting Panel 2:    Angelia Mould, MD - Primary  PHYSICIAN ASSISTANT:   ASSISTANTS:    ANESTHESIA:   general  EBL:  500 mL   BLOOD ADMINISTERED:none  DRAINS: none   LOCAL MEDICATIONS USED:  MARCAINE    and NONE  SPECIMEN:  No Specimen  DISPOSITION OF SPECIMEN:  N/A  COUNTS:  YES  TOURNIQUET:  * No tourniquets in log *  DICTATION: .Dragon Dictation  PLAN OF CARE: Admit to inpatient   PATIENT DISPOSITION:  PACU - hemodynamically stable.   Delay start of Pharmacological VTE agent (>24hrs) due to surgical blood loss or risk of bleeding: yes

## 2018-09-20 NOTE — Op Note (Signed)
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  Mazor guided S1 pedicle screw, S2 alar iliac wing screw placement bilaterally   Surgeon:Ariel Dimitri A.Bettejane Leavens, M.D.  Asst. Surgeon: Ellene Route  Vascular surgeon: Scot Dock  Anesthesia: General  Indication: 53 year old male status post prior thoracic lumbar scoliosis surgery in adolescence.  Patient with significant degenerative change and multiple areas of disc herniation and stenosis in his lower lumbar spine and he is status post prior L3-4 and L4-5 decompression and fusion surgeries.  Patient is also status post L5-S1 laminotomy and microdiscectomy on the right.  Patient presents with severe pain and worsening weakness.  Work-up demonstrates solid fusion from his upper thoracic region all the way down to L5.  The patient has developed severe instability with fishmouthing and retrolisthesis of L5 on S1 with severe foraminal stenosis and recurrent lateral recess stenosis.  Patient also with critical spinal stenosis at L2-3 secondary to hypertrophic bone formation which is severely constricting his canal.  Patient presents now for anterior interbody decompression and fusion and subsequent posterior decompression with fixation including iliac wing screws.  Operative note: After induction  anesthesia, patient position supine and appropriately padded.  Dr. Scot Dock of the vascular surgery service performed a left-sided anterior retroperitoneal approach.  Retractors were placed.  I then performed discectomy at L5-S1.  All limits the disc removed down to the level of the posterior annulus.  Posterior annulus and osteophytes were further removed using curettes and the high-speed drill and Kerrison rongeurs.  Underlying thecal sac was found to be well decompressed.  Foraminotomies were completed bilaterally.  The disc space was then sized and a 15 mm high by 20 degree large globus medical cage was packed with DBX putty and bone morphogenic protein soaked sponge.  This was impacted into place.  And confirmed to be in good position on both AP and lateral fluoroscopic.  Anterior plate fixation was then secured using stay pins into the body of L5 and S1.  Final images reveal good position the cage and the hardware at the proper operative level with improved alignment of the spine.  Wound is then irrigated fanlike solution.  Is then closed with a running #1 PDS at the anterior rectus sheath fascia and 2-0 Vicryl suture at the deep dermis and 3-0 Vicryl suture and a subcuticular layer.  Steri-Strips and sterile dressing were applied.  Patient was then turned in [facility] and appropriately padded in the prone position.  Patient's thoracic lumbar and sacral region were prepped and draped sterilely.  Incision was then made from L2 down to S2.  Dissection performed bilaterally.  Previously placed pedicle screw instrumentation at L3-L4 and L5 was dissected free bilaterally.  Entry sites for pedicle screws at S1 and S2 were dissected free bilaterally.  Previous laminotomy on the right at L5-S1 was dissected free.  Complete laminectomy of L5 was then performed using [facility] and high-speed drill to remove the entire lamina of L5.  Ligament flavum and epidural scar were  elevated and  resected.  Foraminotomies were performed on the course the exiting L5 and S1 nerve roots including complete inferior facetectomies of L5 and majority superior facetectomies of S1 bilaterally.  There was no evidence of injury to thecal sac or nerve roots.  Gelfoam was placed topically to hemostasis.  The Mazor robot was then attached to the table and a pin was placed into his right posterior superior iliac spine.  This was then attached to the fiducial and locked into place with the robot.  Pedicle screw trajectories have been preplanned into the robot.  Using the robot for guidance S2 alar iliac wing screws were then performed bilaterally.  The guide posts were impacted in place.  Pilot holes were drilled.  Guidewire was left in place and the screw hole was tapped and then a 8.5 mm x 70 mm Medtronic screw was then passed into the iliac wings bilaterally.  S1 screws had also been planned.  The robot was used for guidance.  Once again the hole was drilled tapped and 7.5 millimeter screws were placed bilaterally at S1.  Previously placed instrumentation at L3-L4-L5 was disassembled and the pedicle screws were removed.  Pedicle screws were replaced with 7.5 mm Medtronic screws at the L3-L4 and L5 levels.  Short segment of titanium rod was then contoured and placed over the screw heads from L3 down to S2.  Locking caps were placed over the screws.  Locking caps were then engaged and given a final tightening.  Final images reveal good position of the hardware and cages at the proper operative level with improved alignment of the spine.  Attention was then placed to the bony stenosis at the L2-3 level.  The bone was carefully removed down to a very thin eggshell layer overlying the dura.  The bone was then carefully resected using a 2 mm Kerrisons laterally.  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  There is a small amount of CSF leakage.  There was no  evidence of nerve root injury.  The dura itself was not available to be primarily repaired.  I do not see any way to sew a patch in place.  After the decompression had been completely achieved I then placed a piece of DuraGen over the dural opening and secured this in place with adherenis fibrin sealant.  I placed a second piece of DuraGen over the top of this and placed more fibrin sealant.  This seemed to form a watertight barrier.  Vancomycin powder was placed in the deep wound space.  Morselized autograft was packed posterior laterally at L5-S1 for later fusion.  Vancomycin powder was placed the deep wound space.  Wounds and closed in layers of Vicryl sutures.  Skin is further secured using a running 2-0 nylon suture.  There were no apparent complications with the procedure.  Patient tolerated the procedure well and he returned to the recovery room postop.

## 2018-09-20 NOTE — Transfer of Care (Signed)
Immediate Anesthesia Transfer of Care Note  Patient: [name] White.  Procedure(s) Performed: Lumbar 5 Sacral 1 Anterior lumbar interbody fusion with Lumbar 3 to Iliac fusion with pedicle screws/Lumbar 2-3 Decompressive laminectomy (N/A ) POSTERIOR LUMBAR FUSION 4 LEVEL (N/A Back) APPLICATION OF ROBOTIC ASSISTANCE FOR SPINAL PROCEDURE (N/A ) ABDOMINAL EXPOSURE (N/A )  Patient Location: PACU  Anesthesia Type:General  Level of Consciousness: drowsy and patient cooperative  Airway & Oxygen Therapy: Patient Spontanous Breathing and Patient connected to nasal cannula oxygen  Post-op Assessment: Report given to RN and Post -op Vital signs reviewed and stable  Post vital signs: Reviewed and stable  Last Vitals:  Vitals Value Taken Time  BP 129/85 09/20/2018  4:34 PM  Temp    Pulse 109 09/20/2018  4:38 PM  Resp 16 09/20/2018  4:38 PM  SpO2 95 % 09/20/2018  4:38 PM  Vitals shown include unvalidated device data.  Last Pain:  Vitals:   09/20/18 0600  TempSrc:   PainSc: 9       Patients Stated Pain Goal: 3 (77/11/65 7903)  Complications: No apparent anesthesia complications

## 2018-09-20 NOTE — H&P (Signed)
[name] White. is an 53 y.o. male.   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  Patient with subsequent severe disc degeneration and subsequent structural issues at L3-4 and L4-5 requiring a ventral decompression and fusion.  Patient now with severe back pain and bilateral lower extremity symptoms consistent with bilateral L5 radiculopathies.  Work-up demonstrates evidence of instability with pronounced disc space collapse and stenosis at L5-S1.  Patient also with severe stenosis at the base of his fusion mass at L2.  Patient presents now for anterior lumbar interbody fusion with sagittal plane restoration at L5 with posterior lumbar fusion from L3 to his iliac wings utilizing Mazor robot guidance.  He will also undergo decompressive surgery at L2 and at L5-S1.  Past Medical History:  Diagnosis Date  . AF (atrial fibrillation) ([place]) 2009   nonsustained  . Arthritis   . Blood transfusion without reported diagnosis    1960's/ as baby  . Cardiomyopathy secondary 2009   had ablation  . Chronic pain syndrome    back pain  . Dysrhythmia    states no longer a problem  . Heart murmur    states no longer there  . History of kidney stones   . Hyperlipidemia   . Hypertension   . Inguinal hernia    left  . Nephrolithiasis    2 kidney stones  . PSVT (paroxysmal supraventricular tachycardia) ([place])    had ablation  . Scoliosis    severe/ has a lot metal in back per pt/     Past Surgical History:  Procedure Laterality Date  . AV NODE ABLATION     09/16/07. SVT Dr. Caryl Comes  . BACK SURGERY     01/12/00 and 1982/ has had 6 surgeries on back  . COLONOSCOPY    . INGUINAL HERNIA REPAIR     Left  . REPLACEMENT TOTAL KNEE     1999, L. Murphy/wainer.   Marland Kitchen TOTAL HIP ARTHROPLASTY Right 12/07/2016   Procedure: TOTAL HIP ARTHROPLASTY ANTERIOR APPROACH;  Surgeon: Frederik Pear, MD;  Location: [facility];   Service: Orthopedics;  Laterality: Right;    Family History  Problem Relation Age of Onset  . Breast cancer Mother   . Diabetes Father   . Heart attack Father        late 37s  . Atrial fibrillation Father   . Liver disease Paternal Uncle    Social History:  reports that he has been smoking cigarettes. He has a 20.00 pack-year smoking history. He has never used smokeless tobacco. He reports current alcohol use of about 12.0 standard drinks of alcohol per week. He reports that he does not use drugs.  Allergies: No Known Allergies  Facility-Administered Medications Prior to Admission  Medication Dose Route Frequency Provider Last Rate Last Dose  . 0.9 %  sodium chloride infusion  500 mL Intravenous Continuous Armbruster, Carlota Raspberry, MD       Medications Prior to Admission  Medication Sig Dispense Refill  . acetaminophen (TYLENOL) 325 MG tablet Take 650 mg by mouth every 6 (six) hours as needed for moderate pain.     . diphenhydrAMINE (BENADRYL) 25 MG tablet Take 25 mg by mouth at bedtime as needed for allergies.     Marland Kitchen esomeprazole (NEXIUM) 40 MG packet Take 40 mg by mouth daily before breakfast.    . loperamide (IMODIUM A-D) 2 MG tablet Take 2-4 mg by mouth 3 (three) times daily  as needed for diarrhea or loose stools.     . Multiple Vitamin (MULTIVITAMIN WITH MINERALS) TABS tablet Take 1 tablet by mouth daily. Centrum Silver    . Oxycodone HCl 10 MG TABS Take 10 mg by mouth every 6 (six) hours as needed (pain).      No results found for this or any previous visit (from the past 48 hour(s)). No results found.  Pertinent items noted in HPI and remainder of comprehensive ROS otherwise negative.  Blood pressure (!) 164/97, pulse (!) 102, temperature 98.1 F (36.7 C), temperature source Oral, resp. rate 20, height 5\' 7"  (1.702 m), weight 90.3 kg, SpO2 96 %.  Patient is awake and alert.  He is oriented and appropriate.  Cranial nerve function is intact.  Motor examination extremities  reveals weakness of dorsiflexion bilaterally left greater than right.  Sensory examination decreased sensation pinprick light touch in his L5 and S1 dermatomes bilaterally.  Deep tendon reflexes are normal active except his Achilles reflexes are absent bilaterally.  Gait is antalgic.  Posture is moderately flexed.  Examination head ears eyes nose throat summer.  Chest and abdomen are benign.  Extremities are free from injury deformity. Assessment/Plan L5-S1 retrolisthesis with stenosis status post prior extensive thoracic lumbar fusion to L5.  Plan L5-S1 anterior lumbar interbody fusion with subsequent L5-S1 posterior lumbar decompression and fusion utilizing segmental pedicle screw fixation and sacral iliac wing fixation.  Risks and benefits of been explained.  Patient wishes to proceed.  Cooper Render Everlena Mackley 09/20/2018, 7:33 AM

## 2018-09-20 NOTE — Interval H&P Note (Signed)
History and Physical Interval Note:  09/20/2018 7:23 AM  [name] White.  has presented today for surgery, with the diagnosis of Radiculopathy, Lumbar region  The various methods of treatment have been discussed with the patient and family. After consideration of risks, benefits and other options for treatment, the patient has consented to  Procedure(s) with comments: Lumbar 5 Sacral 1 Anterior lumbar interbody fusion with Lumbar 3 to Iliac fusion with pedicle screws/Lumbar 2-3 Decompressive laminectomy (N/A) - Lumbar 5 Sacral 1 Anterior lumbar interbody fusion with Lumbar 3 to Iliac fusion with pedicle screws/Lumbar 2-3 Decompressive laminectomy POSTERIOR LUMBAR FUSION 4 LEVEL (N/A) APPLICATION OF ROBOTIC ASSISTANCE FOR SPINAL PROCEDURE (N/A) ABDOMINAL EXPOSURE (N/A) as a surgical intervention .  The patient's history has been reviewed, patient examined, no change in status, stable for surgery.  I have reviewed the patient's chart and labs.  Questions were answered to the patient's satisfaction.     Deitra Mayo

## 2018-09-20 NOTE — Op Note (Signed)
    NAME: Edwards Mckelvie.    MRN: [id number] DOB: 1966-01-12    DATE OF OPERATION: 09/20/2018  PREOP DIAGNOSIS:    Anterior retroperitoneal exposure of L5-S1  POSTOP DIAGNOSIS:    Same  PROCEDURE:    Anterior retroperitoneal exposure of L5-S1  EXPOSURE SURGEON: Judeth Cornfield. Scot Dock, MD, FACS  SPINE SURGEON: Granville Lewis, MD  ANESTHESIA: General  EBL: Minimal  INDICATIONS:    [name] White. is a 53 y.o. male who is had multiple previous back operations.  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  He presents for a two-stage procedure.  Initial stage I was requested to provide anterior retroperitoneal exposure of L5-S1  FINDINGS:   Markedly degenerative L5-S1 disc space with inflammation and adhesion of the left common iliac vein to the disc.  TECHNIQUE:   The patient was taken to the operating room and received a general anesthetic.  A pulse oximeter was placed on the left great toe.  The level of the L5-S1 to space was marked under fluoroscopy.  The abdomen was prepped and draped in the usual sterile fashion.  Incision was made at the marked level.  This was a transverse incision at the marked level.  The dissection was carried down to the anterior fascia.  This was opened transversely.  The incision the fascia was extended past that linea alba to expose a small amount of the right rectus abdominis muscle.  Laterally the the fascia was divided quite far laterally to get past the muscle.  Ultimate was difficult to determine the endpoint of the rectus abdominis muscle as the linea semilunaris was not clearly able to be identified.  We therefore retracted the muscle laterally and pulled the rectus muscle to the patient's left.  Retroperitoneal space was then entered.  The dissection was carried down to the psoas muscle.  Dissection was then carried down to the iliac vessels and then further superiorly to expose the sacral promontory.  Of note  there was significant scar tissue around the L5-S1 to space because of his significant degenerative disease.  The left common iliac vein was essentially stuck to the disc making it impossible to safely mobilize the vein off of the disc.  On the right side I was able to get the reverse lip retractor positioned to expose the right side of the disc space.  This was secured with a Systems developer.  On the left side however we were unable to mobilize the vein to allow this type of exposure.  I therefore held a retractor to protect the vein while Dr. Trenton Gammon remove the disc space.  Ultimately we were able to get a retractor to protect the vein.  The remainder of the dictation is as per Dr. Trenton Gammon.  Of note at the end of the procedure the pulse oximete was registering 0 briefly.  I examined the foot which appeared to have good perfusion.  There was a brisk dorsalis pedis and posterior tibial signal in the left foot with the Doppler.  Deitra Mayo, MD, FACS [facility]  DATE OF DICTATION:   09/20/2018

## 2018-09-21 MED ORDER — LABETALOL HCL 5 MG/ML IV SOLN
10.0000 mg | INTRAVENOUS | Status: DC | PRN
  Administered 2018-09-21 (×3): 20 mg via INTRAVENOUS
  Administered 2018-09-24: 10 mg via INTRAVENOUS
  Filled 2018-09-21 (×4): qty 4

## 2018-09-21 NOTE — Progress Notes (Signed)
Stop day 1.  Overall doing well.  Back pain controlled.  Lower extremity radicular pain much improved.  Able to stand and walk a little bit today with therapy.  Motor and sensory exam stable.  Still with significant right-sided dorsiflexion weakness unchanged from preop.  His left-sided dorsiflexion weakness is improved however.  Wound is clean and dry.  Chest and abdomen benign.  Overall progressing well following anterior posterior lumbar fusion.  Continue efforts at mobilization.

## 2018-09-21 NOTE — Progress Notes (Signed)
PT Cancellation Note  Patient Details Name: [name] White. MRN: [id number] DOB: 03-25-1966   Cancelled Treatment:    Reason Eval/Treat Not Completed: Fatigue/lethargy limiting ability to participate; just back to bed after OT and up in chair an hour.  Will attempt this pm for mobility/ambulation as tolerated.   Reginia Naas 09/21/2018, 9:57 AM  Magda Kiel, [place] [phone] 09/21/2018

## 2018-09-21 NOTE — Progress Notes (Signed)
   VASCULAR SURGERY ASSESSMENT & PLAN:   1 Day Post-Op s/p: Anterior retroperitoneal exposure of L5-S1  Doing well.  Getting out of bed this morning with physical therapy.  Vascular surgery will be available as needed.  SUBJECTIVE:   Pain adequately controlled.  PHYSICAL EXAM:   Vitals:   09/20/18 1945 09/20/18 2300 09/21/18 0300 09/21/18 0818  BP: (!) 148/80 126/70 (!) 158/89 (!) 167/103  Pulse: (!) 118 (!) 114 (!) 119 (!) 111  Resp: 13 17  20   Temp: 99.1 F (37.3 C) 98.6 F (37 C) 99.1 F (37.3 C) 98.2 F (36.8 C)  TempSrc: Oral Oral Oral Oral  SpO2: 97% 97% 100% 98%  Weight:      Height:       Left foot warm and well-perfused.  LABS:   PROBLEM LIST:    Active Problems:   Status post spinal surgery   Degenerative spondylolisthesis  CURRENT MEDS:   . multivitamin with minerals  1 tablet Oral Daily  . pantoprazole  80 mg Oral Q1200  . sodium chloride flush  3 mL Intravenous Q12H   Deitra Mayo Beeper: [phone] Office: [phone] 09/21/2018

## 2018-09-21 NOTE — Evaluation (Signed)
Physical Therapy Evaluation Patient Details Name: [name] White. MRN: [id number] DOB: 1966-01-03 Today's Date: 09/21/2018   History of Present Illness  Pt is a 53 yo male sp L2-3 redo decmpression laminectomy; L5-S1 redo decompression laminectomy with bl decompression foraminotomies, L5-S1 posterior lateral arthrodesis with peduncle screw fixation L5-S2. Pmhx: multiple back surgeries, scoliosis, degenerative disc dx, afib, chronic pain syndrome, HLD, HTN.  Clinical Impression  Patient presents with decreased independence with mobility due to pain, limited activity tolerance, decreased balance, and will benefit from skilled PT in the acute setting to allow return home following SNF level rehab stay due to pt usually caring for his dad in the home.      Follow Up Recommendations SNF    Equipment Recommendations  None recommended by PT    Recommendations for Other Services       Precautions / Restrictions Precautions Precautions: Back Precaution Comments: recalled all 3 on his own Required Braces or Orthoses: Spinal Brace Spinal Brace: Lumbar corset;Applied in sitting position      Mobility  Bed Mobility Overal bed mobility: Needs Assistance Bed Mobility: Rolling;Sidelying to Sit;Sit to Sidelying Rolling: Supervision Sidelying to sit: Min assist;HOB elevated     Sit to sidelying: Min assist General bed mobility comments: assist for trunk to upright, assist for leg onto bed  Transfers Overall transfer level: Needs assistance Equipment used: Rolling walker (2 wheeled) Transfers: Sit to/from Stand Sit to Stand: Min assist;From elevated surface         General transfer comment: rised bed due to bed at home higher  Ambulation/Gait Ambulation/Gait assistance: Min assist Gait Distance (Feet): 50 Feet Assistive device: Rolling walker (2 wheeled) Gait Pattern/deviations: Step-to pattern;Step-through pattern;Wide base of support;Decreased stride length     General Gait  Details: slow pace, heavy UE support on walker, assist for safety/balance  Stairs            Wheelchair Mobility    Modified Rankin (Stroke Patients Only)       Balance Overall balance assessment: Needs assistance Sitting-balance support: Feet supported;No upper extremity supported Sitting balance-Leahy Scale: Fair Sitting balance - Comments: EOB   Standing balance support: Bilateral upper extremity supported Standing balance-Leahy Scale: Poor Standing balance comment: heavy UE support needed for balance                             Pertinent Vitals/Pain Pain Assessment: Faces Faces Pain Scale: Hurts whole lot Pain Location: neck and incisions Pain Descriptors / Indicators: Guarding;Grimacing;Discomfort Pain Intervention(s): Repositioned;Monitored during session;Patient requesting pain meds-RN notified    Home Living Family/patient expects to be discharged to:: Private residence Living Arrangements: Parent Available Help at Discharge: Family;Friend(s);Available PRN/intermittently Type of Home: House Home Access: Ramped entrance     Home Layout: One level Home Equipment: Bedside commode;Walker - 2 wheels Additional Comments: 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    Prior Function Level of Independence: Independent with assistive device(s)               Hand Dominance        Extremity/Trunk Assessment   Upper Extremity Assessment Upper Extremity Assessment: Defer to OT evaluation    Lower Extremity Assessment Lower Extremity Assessment: RLE deficits/detail;LLE deficits/detail RLE Deficits / Details: AROM WFL, strength grossly 4/5 except hip flexion 3/5 RLE Sensation: decreased light touch(on lateral thigh) LLE Deficits / Details: AROM WFL, strength grossly 4/5 LLE Sensation: decreased light touch(on lateral thigh and foot)    Cervical /  Trunk Assessment Cervical / Trunk Assessment: Other exceptions Cervical / Trunk Exceptions: significant  scoliosis from youth  Communication   Communication: No difficulties  Cognition Arousal/Alertness: Awake/alert Behavior During Therapy: WFL for tasks assessed/performed Overall Cognitive Status: Within Functional Limits for tasks assessed                                        General Comments General comments (skin integrity, edema, etc.): on RA SpO2 WNL; sat in chair to eat a little while linens changed prior to return to supine    Exercises     Assessment/Plan    PT Assessment Patient needs continued PT services  PT Problem List Decreased strength;Decreased balance;Decreased activity tolerance;Decreased mobility;Pain       PT Treatment Interventions DME instruction;Therapeutic exercise;Gait training;Balance training;Functional mobility training;Therapeutic activities;Patient/family education    PT Goals (Current goals can be found in the Care Plan section)  Acute Rehab PT Goals Patient Stated Goal: wants to do rehab first PT Goal Formulation: With patient Time For Goal Achievement: 09/28/18 Potential to Achieve Goals: Good    Frequency Min 5X/week   Barriers to discharge        Co-evaluation               AM-PAC PT "6 Clicks" Mobility  Outcome Measure Help needed turning from your back to your side while in a flat bed without using bedrails?: A Little Help needed moving from lying on your back to sitting on the side of a flat bed without using bedrails?: A Little Help needed moving to and from a bed to a chair (including a wheelchair)?: A Little Help needed standing up from a chair using your arms (e.g., wheelchair or bedside chair)?: A Little Help needed to walk in hospital room?: A Little Help needed climbing 3-5 steps with a railing? : A Little 6 Click Score: 18    End of Session Equipment Utilized During Treatment: Back brace Activity Tolerance: Patient limited by pain Patient left: in bed;with call bell/phone within reach Nurse  Communication: Mobility status PT Visit Diagnosis: Other abnormalities of gait and mobility (R26.89);Difficulty in walking, not elsewhere classified (R26.2)    Time: 1400-1433 PT Time Calculation (min) (ACUTE ONLY): 33 min   Charges:   PT Evaluation $PT Eval Moderate Complexity: 1 Mod PT Treatments $Gait Training: 8-22 mins        Magda Kiel, [place] Acute Rehabilitation Services [phone] 09/21/2018   Reginia Naas 09/21/2018, 4:28 PM

## 2018-09-21 NOTE — Evaluation (Signed)
Occupational Therapy Evaluation Patient Details Name: [name] White. MRN: [id number] DOB: 1966/03/17 Today's Date: 09/21/2018    History of Present Illness Pt is a 53 yo male sp L2-3 redo decmpression laminectomy; L5-S1 redo decompression laminectomy with bl decompression foraminotomies, L5-S1 posterior lateral arthrodesis with peduncle screw fixation L5-S2. Pmhx: multiple back surgeries, scoliosis, degenerative disc dx, afib, chronic pain syndrome, HLD, HTN.   Clinical Impression   Pt is a 53 yo male s/p above dx. Pt PTA: living with father, independently and his father's caregiver. Pt reports 57 prior back surgeries. Pt currently limited by pain. Pt logrolling with supervisionA and stand pivot transfer with RW and minA. Back handout provided and reviewed adls in detail. Pt educated on: clothing between brace, never sleep in brace, set an alarm at night for medication, avoid sitting for long periods of time, correct bed positioning for sleeping, correct sequence for bed mobility, avoiding lifting more than 5 pounds and never wash directly over incision. All education is complete and patient indicates understanding. Pt requires continued OT skilled services for ADL, mobility and safety in SNF setting. Pt may progress to [facility] therapy, but pt's pain remains uncontrolled.      Follow Up Recommendations  SNF;Supervision - Intermittent(may progress to [place]. )    Equipment Recommendations  None recommended by OT    Recommendations for Other Services PT consult(ordered)     Precautions / Restrictions Precautions Precautions: Back Precaution Booklet Issued: Yes (comment) Precaution Comments: Went over precautions verbally- pt able to log roll Required Braces or Orthoses: Spinal Brace Spinal Brace: Lumbar corset;Applied in sitting position Restrictions Weight Bearing Restrictions: No      Mobility Bed Mobility Overal bed mobility: Needs Assistance Bed Mobility: Rolling;Sidelying to  Sit Rolling: Supervision Sidelying to sit: Supervision;HOB elevated(+ use of railings)       General bed mobility comments: Use of railings- attempted log roll with flat bed, but requires 20* of elevation at head to to sit EOB w supervisionA  Transfers Overall transfer level: Needs assistance Equipment used: Rolling walker (2 wheeled) Transfers: Sit to/from [facility] Sit to Stand: Min assist;From elevated surface Stand pivot transfers: Min assist;From elevated surface       General transfer comment: "the floor feels slanted" pt sliding R foot, unable to perform full dorsiflexion to pick foor off of ground.    Balance Overall balance assessment: Needs assistance Sitting-balance support: Bilateral upper extremity supported Sitting balance-Leahy Scale: Fair       Standing balance-Leahy Scale: Fair                             ADL either performed or assessed with clinical judgement   ADL Overall ADL's : Needs assistance/impaired Eating/Feeding: Set up   Grooming: Minimal assistance;Sitting(pt with IV and dorsal blocking foam for IV access point-)   Upper Body Bathing: Moderate assistance;Sitting   Lower Body Bathing: Maximal assistance;Sitting/lateral leans;Bed level   Upper Body Dressing : Moderate assistance;Sitting   Lower Body Dressing: Maximal assistance;Cueing for back precautions;Sitting/lateral leans;Sit to/from stand   Toilet Transfer: Minimal assistance;Stand-pivot;BSC   Toileting- Clothing Manipulation and Hygiene: Maximal assistance;Sitting/lateral lean;Sit to/from stand;Cueing for back precautions       Functional mobility during ADLs: Minimal assistance;Cueing for safety;Cueing for sequencing;Rolling walker(minA for stand pivot only) General ADL Comments: Unable to perform figure 4 technique. Set-up to minA for grooming; modA for UB ADL and MaxA for LB ADL     Vision  Baseline Vision/History: No visual deficits Vision  Assessment?: No apparent visual deficits     Perception     Praxis      Pertinent Vitals/Pain Pain Assessment: 0-10 Pain Score: 8  Pain Location: neck and incisions Pain Descriptors / Indicators: Crushing;Discomfort;Pounding Pain Intervention(s): Limited activity within patient's tolerance;Repositioned;Premedicated before session     Hand Dominance Right   Extremity/Trunk Assessment Upper Extremity Assessment Upper Extremity Assessment: Generalized weakness   Lower Extremity Assessment Lower Extremity Assessment: Generalized weakness;RLE deficits/detail;LLE deficits/detail RLE Deficits / Details: decreased mobility- unable to peform figure 4 technique RLE Sensation: decreased light touch(feet on ground felt "slanted") RLE Coordination: WNL LLE Deficits / Details: decreased mobility- unable to peform figure 4 technique   Cervical / Trunk Assessment Cervical / Trunk Assessment: Normal   Communication Communication Communication: No difficulties   Cognition Arousal/Alertness: Awake/alert Behavior During Therapy: WFL for tasks assessed/performed Overall Cognitive Status: Within Functional Limits for tasks assessed                                     General Comments  BP 172/112 to conclude session. NT in room; O2 used    Exercises Exercises: Other exercises Other Exercises Other Exercises: ankle pumps bilateral x5 each   Shoulder Instructions      Home Living Family/patient expects to be discharged to:: Private residence Living Arrangements: Parent Available Help at Discharge: Family;Friend(s);Available PRN/intermittently Type of Home: House Home Access: Ramped entrance     Home Layout: One level     Bathroom Shower/Tub: Walk-in shower;Tub/shower unit   Bathroom Toilet: Handicapped height     Home Equipment: Bedside commode;Walker - 2 wheels   Additional Comments: 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      Prior Functioning/Environment Level of  Independence: Independent with assistive device(s)                 OT Problem List: Decreased strength;Decreased activity tolerance;Impaired balance (sitting and/or standing);Decreased safety awareness;Pain;Increased edema;Impaired UE functional use;Decreased coordination      OT Treatment/Interventions: Self-care/ADL training;Therapeutic exercise;Energy conservation;Therapeutic activities;DME and/or AE instruction;Patient/family education;Balance training    OT Goals(Current goals can be found in the care plan section) Acute Rehab OT Goals Patient Stated Goal: to go home with less pain OT Goal Formulation: With patient Time For Goal Achievement: 10/05/18 Potential to Achieve Goals: Fair ADL Goals Pt Will Perform Grooming: with set-up;sitting Pt Will Perform Upper Body Dressing: with set-up;sitting Pt Will Perform Lower Body Dressing: with min assist;with adaptive equipment;sit to/from stand;sitting/lateral leans Pt Will Perform Toileting - Clothing Manipulation and hygiene: with supervision;sit to/from stand  OT Frequency: Min 2X/week   Barriers to D/C: Decreased caregiver support          Co-evaluation              AM-PAC OT "6 Clicks" Daily Activity     Outcome Measure Help from another person eating meals?: A Little Help from another person taking care of personal grooming?: A Little Help from another person toileting, which includes using toliet, bedpan, or urinal?: A Lot Help from another person bathing (including washing, rinsing, drying)?: A Lot Help from another person to put on and taking off regular upper body clothing?: A Lot Help from another person to put on and taking off regular lower body clothing?: Total 6 Click Score: 13   End of Session Equipment Utilized During Treatment: Rolling walker;Back brace;Oxygen Nurse Communication: Mobility status  Activity Tolerance: Patient tolerated treatment well Patient left: in chair;with call bell/phone within  reach;with chair alarm set  OT Visit Diagnosis: Unsteadiness on feet (R26.81);Muscle weakness (generalized) (M62.81)                Time: 0315-9458 OT Time Calculation (min): 44 min Charges:  OT General Charges $OT Visit: 1 Visit OT Evaluation $OT Eval Moderate Complexity: 1 Mod OT Treatments $Self Care/Home Management : 8-22 mins $Neuromuscular Re-education: 8-22 mins  [name]) [name] OTR/L Acute Rehabilitation Services Pager: [phone] Office: [phone]  Fredda Hammed 09/21/2018, 8:40 AM

## 2018-09-22 ENCOUNTER — Encounter (HOSPITAL_COMMUNITY): Payer: Self-pay | Admitting: Neurosurgery

## 2018-09-22 NOTE — Consult Note (Signed)
            [facility] CM Primary Care Navigator  09/22/2018  Deng Kemler 01-Dec-1965 [id number]   Went to see patient at the bedside to identify possible discharge needs but he requests to be seen at another date stating that he has been in a lot of pain and has not been controlled yet.  Will attempt to see patient at another time when he is ready to talk with and pain is in better control.    Addendum (09/26/18):  Went to see patient again at the bedside to identify possible discharge needs buthe is in the process of being transferred to the operating room for surgery per RN report. OR staff is in the room getting patient ready to be transported at this time.  Will try to see patient at another time when he is available in the room.    Addendum (09/27/18):  Attempttosee patient again at the bedside to identify possible discharge needs but he was transferred to ICU (4N 21) after his surgery.  Will attempt tofollow-up andsee patientforfurther THN-CM needswhen available and out of ICU.    For additional questions please contact:  Edwena Felty A. Randi Poullard, BSN, RN-BC [facility] PRIMARY CARE Navigator Cell: [phone]

## 2018-09-22 NOTE — NC FL2 (Addendum)
[place] LEVEL OF CARE SCREENING TOOL     IDENTIFICATION  Patient Name: [name] White. Birthdate: [birth year] Sex: male Admission Date (Current Location): 09/20/2018  [facility] and [place] Number:  Herbalist and Address:  [facility], [place] [address] [place], [place] [address]      Provider Number: [id number]  Attending Physician Name and Address:  Earnie Larsson, MD  Relative Name and Phone Number:   [name] White, father, [phone]    Current Level of Care: Hospital Recommended Level of Care: [place] Prior Approval Number:    Date Approved/Denied:   PASRR Number: [id number]  Discharge Plan: SNF    Current Diagnoses: Patient Active Problem List   Diagnosis Date Noted  . Status post spinal surgery 09/20/2018  . Degenerative spondylolisthesis 09/20/2018  . Arthritis of right hip 12/07/2016  . Primary osteoarthritis of right hip 12/02/2016  . History of colon polyps 09/16/2016  . Genital warts 07/06/2016  . Osteoarthritis of right knee 12/04/2014  . Former smoker 12/04/2014  . Hyperglycemia 05/27/2012  . SVT/ PSVT/ PAT 06/16/2010  . SCOLIOSIS, THORACOLUMBAR 01/06/2010  . Hyperlipidemia 04/14/2007  . Essential hypertension 03/07/2007    Orientation RESPIRATION BLADDER Height & Weight     Self, Time, Situation, Place  Normal Continent Weight: 199 lb (90.3 kg) Height:  5\' 7"  (170.2 cm)  BEHAVIORAL SYMPTOMS/MOOD NEUROLOGICAL BOWEL NUTRITION STATUS      Continent Diet(see discharge summary)  AMBULATORY STATUS COMMUNICATION OF NEEDS Skin   Extensive Assist Verbally Surgical wounds(incision on back and abdomen with honeycomb dressings to be changed PRN)                       Personal Care Assistance Level of Assistance  Bathing, Dressing, Feeding Bathing Assistance: Limited assistance Feeding assistance: Independent Dressing Assistance: Limited assistance     Functional Limitations Info   Sight, Hearing, Speech Sight Info: Adequate Hearing Info: Adequate Speech Info: Adequate    SPECIAL CARE FACTORS FREQUENCY  PT (By licensed PT), OT (By licensed OT)     PT Frequency: 5x week OT Frequency: 5x week            Contractures Contractures Info: Not present    Additional Factors Info  Code Status, Allergies Code Status Info: Full Code Allergies Info: No Known Allergies           Current Medications (09/22/2018):  This is the current hospital active medication list Current Facility-Administered Medications  Medication Dose Route Frequency Provider Last Rate Last Dose  . 0.9 %  sodium chloride infusion  250 mL Intravenous Continuous Earnie Larsson, MD      . acetaminophen (TYLENOL) tablet 650 mg  650 mg Oral Q4H PRN Earnie Larsson, MD   650 mg at 09/20/18 1843   Or  . acetaminophen (TYLENOL) suppository 650 mg  650 mg Rectal Q4H PRN Earnie Larsson, MD      . bisacodyl (DULCOLAX) suppository 10 mg  10 mg Rectal Daily PRN Earnie Larsson, MD      . diazepam (VALIUM) tablet 5-10 mg  5-10 mg Oral Q6H PRN Earnie Larsson, MD   10 mg at 09/22/18 0746  . diphenhydrAMINE (BENADRYL) capsule 25 mg  25 mg Oral QHS PRN Earnie Larsson, MD   25 mg at 09/21/18 2221  . HYDROcodone-acetaminophen (NORCO) 10-325 MG per tablet 1 tablet  1 tablet Oral Q4H PRN Earnie Larsson, MD   1 tablet at 09/22/18  0045  . HYDROmorphone (DILAUDID) injection 1 mg  1 mg Intravenous Q2H PRN Earnie Larsson, MD   1 mg at 09/22/18 0747  . labetalol (NORMODYNE,TRANDATE) injection 10-20 mg  10-20 mg Intravenous Q2H PRN Viona Gilmore D, NP   20 mg at 09/21/18 2036  . loperamide (IMODIUM) capsule 2-4 mg  2-4 mg Oral TID PRN Earnie Larsson, MD      . menthol-cetylpyridinium (CEPACOL) lozenge 3 mg  1 lozenge Oral PRN Earnie Larsson, MD       Or  . phenol (CHLORASEPTIC) mouth spray 1 spray  1 spray Mouth/Throat PRN Earnie Larsson, MD      . multivitamin with minerals tablet 1 tablet  1 tablet Oral Daily Earnie Larsson, MD   1 tablet at 09/21/18  0910  . ondansetron (ZOFRAN) tablet 4 mg  4 mg Oral Q6H PRN Earnie Larsson, MD       Or  . ondansetron [facility]) injection 4 mg  4 mg Intravenous Q6H PRN Earnie Larsson, MD      . oxyCODONE (Oxy IR/ROXICODONE) immediate release tablet 10 mg  10 mg Oral Q3H PRN Earnie Larsson, MD   10 mg at 09/22/18 0650  . pantoprazole (PROTONIX) EC tablet 80 mg  80 mg Oral Q1200 Earnie Larsson, MD   80 mg at 09/21/18 1146  . polyethylene glycol (MIRALAX / GLYCOLAX) packet 17 g  17 g Oral Daily PRN Earnie Larsson, MD      . sodium chloride flush (NS) 0.9 % injection 3 mL  3 mL Intravenous Q12H Earnie Larsson, MD   3 mL at 09/21/18 2140  . sodium chloride flush (NS) 0.9 % injection 3 mL  3 mL Intravenous PRN Earnie Larsson, MD      . sodium phosphate (FLEET) 7-19 GM/118ML enema 1 enema  1 enema Rectal Once PRN Earnie Larsson, MD         Discharge Medications: Please see discharge summary for a list of discharge medications.  Relevant Imaging Results:  Relevant Lab Results:   Additional Information [id number] [place], [place]

## 2018-09-22 NOTE — Progress Notes (Signed)
Postop day 2.  Patient's pain well controlled.  States his preoperative radicular pain is completely resolved.  Strength sensation in both legs are much improved.  Abdominal discomfort stable.  Patient is having flatus.  Afebrile.  Remains somewhat tachycardic.  Blood pressure better controlled.  Urine output good.  Awake and alert.  Oriented and appropriate.  Motor examination intact except for some right-sided dorsiflexion weakness that is chronic but is significantly improved from preop.  Wound clean and dry.  Chest and abdomen benign.  Status post revision lumbosacral pelvic fusion.  Continue efforts at mobilization.

## 2018-09-22 NOTE — Progress Notes (Signed)
OT Cancellation Note  Patient Details Name: [name] White. MRN: [id number] DOB: 1966/06/18   Cancelled Treatment:    Reason Eval/Treat Not Completed: Patient declined, no reason specified(Pain uncontrolled. Wanting to rest.)   OT to continue to follow per POC.  Ebony Hail Harold Hedge) [name] OTR/L Acute Rehabilitation Services Pager: [phone] Office: [phone]   Fredda Hammed 09/22/2018, 3:02 PM

## 2018-09-22 NOTE — Clinical Social Work Note (Signed)
Clinical Social Work Assessment  Patient Details  Name: [name] White. MRN: [id number] Date of Birth: [birth year]  Date of referral:  09/22/18               Reason for consult:  Facility Placement                Permission sought to share information with:  Facility Sport and exercise psychologist, Family Supports Permission granted to share information::  Yes, Verbal Permission Granted  Name::    Janalyn Shy  Agency::  SNFs  Relationship::   cousin  Contact Information:   [phone]  Housing/Transportation Living arrangements for the past 2 months:  Single Family Home Source of Information:  Patient Patient Interpreter Needed:  None Criminal Activity/Legal Involvement Pertinent to Current Situation/Hospitalization:  No - Comment as needed Significant Relationships:  Other Family Members, Parents Lives with:  Parents Do you feel safe going back to the place where you live?  Yes Need for family participation in patient care:  Yes (Comment)  Care giving concerns:  Pt from home where he takes care of his father. He has hx of scoliosis and has required multiple surgical interventions. Pt recommended for SNF, he lacks support at home 24/7 at discharge.   Social Worker assessment / plan:  CSW spoke with pt at bedside. Introduced self, role, and reason for visit. Pt in pain and requested CSW call his cousin with any questions. Let pt know that I was going to discuss SNF placement due to him taking care of his father and lacking meaningful support 24/7 at discharge. Pt interested in [place] but wants Delrae Rend, his cousin to manage this decision.   Contacted Alisa (she works for [facility] and is aware of SNF process). She requested list be sent to [email or web address]. She will look over it and talk to pt and then make a placement decision. Pt will meet 3 night inpatient stay tomorrow. Continuing to follow.  Employment status:  Disabled (Comment on whether or not currently receiving  Disability) Insurance information:  Medicare PT Recommendations:  24 Hour Supervision, [place] / Referral to community resources:  [place]  Patient/Family's Response to care:  Pt amenable to speaking with CSW but due to pain requested CSW reach out to his cousin. Cousin open and amenable to speaking with CSW regarding discharge process.  Patient/Family's Understanding of and Emotional Response to Diagnosis, Current Treatment, and Prognosis: Difficult to assess pt understanding and response to diagnosis, current treatment and prognosis while he is in pain. Pt cousin does state understanding of diagnosis, current treatment and prognosis. Pt cousin is familiar with SNF process and will support pt with decision making. Pt and pt cousin both emotionally appropriate and pleasant during assessment and phone call.  Emotional Assessment Appearance:  Appears stated age Attitude/Demeanor/Rapport:  (Pained; Restless) Affect (typically observed):  Restless, Overwhelmed, Accepting Orientation:  Oriented to Self, Oriented to Place, Oriented to  Time, Oriented to Situation Alcohol / Substance use:  Alcohol Use, Tobacco Use Psych involvement (Current and /or in the community):  No (Comment)  Discharge Needs  Concerns to be addressed:  Care Coordination Readmission within the last 30 days:  No Current discharge risk:  Physical Impairment Barriers to Discharge:  Ship broker, Continued Medical Work up   [employer], [place] 09/22/2018, 1:04 PM

## 2018-09-22 NOTE — Progress Notes (Signed)
Physical Therapy Treatment Patient Details Name: [name] White. MRN: [id number] DOB: 04/01/1966 Today's Date: 09/22/2018    History of Present Illness Pt is a 53 yo male sp L2-3 redo decmpression laminectomy; L5-S1 redo decompression laminectomy with bl decompression foraminotomies, L5-S1 posterior lateral arthrodesis with peduncle screw fixation L5-S2. Pmhx: multiple back surgeries, scoliosis, degenerative disc dx, afib, chronic pain syndrome, HLD, HTN.    PT Comments    Pt pleasant and reports 8/10 pain after medication with ability to recall 2/3 precautions and education for all. Pt with min assist to don brace and able to increase gait distance today but reports desire for SNF due to lack of caregiver assist at home and continued intense pain. Will continue to follow.     Follow Up Recommendations  SNF;Supervision/Assistance - 24 hour     Equipment Recommendations  None recommended by PT    Recommendations for Other Services       Precautions / Restrictions Precautions Precautions: Back Precaution Comments: able to state 2/3 on his own, Rt foot drop Required Braces or Orthoses: Spinal Brace Spinal Brace: Lumbar corset;Applied in sitting position    Mobility  Bed Mobility Overal bed mobility: Needs Assistance Bed Mobility: Rolling;Sidelying to Sit Rolling: Min assist Sidelying to sit: Min assist       General bed mobility comments: cues for sequence with assist to rotate and elevate trunk. PT reporting HA with initial sitting but reported dissipated pain after prolonged sitting  Transfers Overall transfer level: Needs assistance Equipment used: Rolling walker (2 wheeled) Transfers: Sit to/from Stand   Stand pivot transfers: Min guard       General transfer comment: cues for hand placement and increased time to rise  Ambulation/Gait Ambulation/Gait assistance: Min guard Gait Distance (Feet): 120 Feet Assistive device: Rolling walker (2 wheeled) Gait  Pattern/deviations: Step-through pattern;Decreased stride length;Trunk flexed   Gait velocity interpretation: 1.31 - 2.62 ft/sec, indicative of limited community ambulator General Gait Details: cues for posture/trunk extension. Increased distance with cues for progression. Pt with right foot drop but managing well with gait and RW   Stairs             Wheelchair Mobility    Modified Rankin (Stroke Patients Only)       Balance Overall balance assessment: Needs assistance Sitting-balance support: Feet supported;No upper extremity supported Sitting balance-Leahy Scale: Fair     Standing balance support: Bilateral upper extremity supported Standing balance-Leahy Scale: Poor                              Cognition Arousal/Alertness: Awake/alert Behavior During Therapy: WFL for tasks assessed/performed Overall Cognitive Status: Within Functional Limits for tasks assessed                                        Exercises      General Comments        Pertinent Vitals/Pain Pain Score: 8  Pain Location: back, and neck Pain Descriptors / Indicators: Aching;Constant;Grimacing Pain Intervention(s): Limited activity within patient's tolerance;Premedicated before session;Monitored during session;Repositioned    Home Living                      Prior Function            PT Goals (current goals can now be found in the care  plan section) Progress towards PT goals: Progressing toward goals    Frequency    Min 5X/week      PT Plan Current plan remains appropriate    Co-evaluation              AM-PAC PT "6 Clicks" Mobility   Outcome Measure  Help needed turning from your back to your side while in a flat bed without using bedrails?: A Little Help needed moving from lying on your back to sitting on the side of a flat bed without using bedrails?: A Little Help needed moving to and from a bed to a chair (including a  wheelchair)?: A Little Help needed standing up from a chair using your arms (e.g., wheelchair or bedside chair)?: A Little Help needed to walk in hospital room?: A Little Help needed climbing 3-5 steps with a railing? : A Lot 6 Click Score: 17    End of Session Equipment Utilized During Treatment: Back brace;Gait belt Activity Tolerance: Patient tolerated treatment well Patient left: in chair;with call bell/phone within reach Nurse Communication: Mobility status PT Visit Diagnosis: Other abnormalities of gait and mobility (R26.89);Difficulty in walking, not elsewhere classified (R26.2)     Time: 9407-6808 PT Time Calculation (min) (ACUTE ONLY): 29 min  Charges:  $Gait Training: 8-22 mins $Therapeutic Activity: 8-22 mins                     Dorethy Tomey Pam Drown, PT Acute Rehabilitation Services Pager: [phone] Office: [place] 09/22/2018, 9:28 AM

## 2018-09-23 MED ORDER — BUTALBITAL-APAP-CAFFEINE 50-325-40 MG PO TABS
1.0000 | ORAL_TABLET | ORAL | Status: DC | PRN
  Administered 2018-09-26 – 2018-10-06 (×8): 1 via ORAL
  Filled 2018-09-23 (×9): qty 1

## 2018-09-23 MED ORDER — ACETAZOLAMIDE ER 500 MG PO CP12
500.0000 mg | ORAL_CAPSULE | Freq: Two times a day (BID) | ORAL | Status: DC
  Administered 2018-09-23 – 2018-10-07 (×29): 500 mg via ORAL
  Filled 2018-09-23 (×31): qty 1

## 2018-09-23 MED ORDER — SODIUM CHLORIDE 0.9 % IV SOLN
1.0000 g | Freq: Two times a day (BID) | INTRAVENOUS | Status: DC
  Administered 2018-09-23 – 2018-09-26 (×7): 1 g via INTRAVENOUS
  Filled 2018-09-23 (×7): qty 10

## 2018-09-23 NOTE — Progress Notes (Signed)
OT Cancellation Note  Patient Details Name: [name] White. MRN: [id number] DOB: [birth year]   Cancelled Treatment:    Reason Eval/Treat Not Completed: Patient not medically ready(CSF leakage. On bedrest at this time. OT to f/up as needed.)   [name]) [name] OTR/L Acute Rehabilitation Services Pager: [phone] Office: [phone]   Fredda Hammed 09/23/2018, 1:08 PM

## 2018-09-23 NOTE — Progress Notes (Signed)
Upon getting out of bed with therapy today the patient had onset of headache with marked wound drainage consistent with CSF.  Patient has been put back to bed.  Now that he is flat in bed he is no longer draining.  He is complaining of headache.  He is having no new neurologic symptoms.  He is afebrile.  Vital signs are stable.  Motor and sensory function extremities are stable.  His wound is currently clean and dry.  The wound is flat.  The suture line is intact.  Patient has had very extensive multilevel lumbar surgery both here recently and many times in the past.  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  This was attempted to be sealed intraoperatively but it looks like that patch is not held.  There are no good options for lumbar drain placement.  There is no good option with regard to direct repair of the CSF fistula.  Plan for right now is to place him at bedrest on Diamox and see if we can get things to heal.  If this is unsuccessful then I think we may have to consider ventriculostomy placement for CSF diversion.

## 2018-09-23 NOTE — Progress Notes (Signed)
Physical Therapy Treatment Patient Details Name: [name] White. MRN: [id number] DOB: 1966-06-10 Today's Date: 09/23/2018    History of Present Illness Pt is a 53 yo male sp L2-3 redo decmpression laminectomy; L5-S1 redo decompression laminectomy with bl decompression foraminotomies, L5-S1 posterior lateral arthrodesis with peduncle screw fixation L5-S2. Pmhx: multiple back surgeries, scoliosis, degenerative disc dx, afib, chronic pain syndrome, HLD, HTN.    PT Comments    Pt pleasant, premedicated and willing to mobilize. Pt able to roll without physical assist and rise to sitting with pt requiring min assist to don brace. In sitting pt again reported HA but this time it did not dissipate and became worse with sitting with pt noting drainage on back. Stood to remove linens under pt and noted excessive fluid with Dr.Jones available on floor and present to visualize pt back with pt then returned to bed with confirmation of CSF leak and now on bedrest. Pt educated for precautions and limitation in mobility at this time. Will follow as pt medically cleared to do so.    Follow Up Recommendations  SNF;Supervision/Assistance - 24 hour     Equipment Recommendations  None recommended by PT    Recommendations for Other Services       Precautions / Restrictions Precautions Precautions: Back Precaution Comments:  Rt foot drop Required Braces or Orthoses: Spinal Brace Spinal Brace: Lumbar corset;Applied in sitting position    Mobility  Bed Mobility Overal bed mobility: Needs Assistance Bed Mobility: Rolling;Sidelying to Sit Rolling: Min guard Sidelying to sit: Min guard     Sit to sidelying: Min assist General bed mobility comments: cues for sequence for rolling with rail and rise from surface. Min assist for lower body with return to supine  Transfers Overall transfer level: Needs assistance   Transfers: Sit to/from Stand Sit to Stand: Min guard;From elevated surface          General transfer comment: cues for hand placement and increased time to rise. Pt on initial rise noted to have clear fluid dripping from dressing with bandage soaked. Pt returned to sitting with DR.Jones available on unit to see pt. Dr.Jones asked pt to stand again with same minguard assist and increased time to rise then pt returned to supine  Ambulation/Gait             General Gait Details: unable due to CSF leak   Stairs             Wheelchair Mobility    Modified Rankin (Stroke Patients Only)       Balance                                            Cognition Arousal/Alertness: Awake/alert Behavior During Therapy: WFL for tasks assessed/performed Overall Cognitive Status: Within Functional Limits for tasks assessed                                        Exercises      General Comments        Pertinent Vitals/Pain Pain Score: 8  Pain Location: back, and neck Pain Descriptors / Indicators: Aching;Constant;Grimacing Pain Intervention(s): Limited activity within patient's tolerance;Premedicated before session;Monitored during session;Repositioned    Home Living  Prior Function            PT Goals (current goals can now be found in the care plan section) Progress towards PT goals: Not progressing toward goals - comment(pt on bedrest end of session)    Frequency           PT Plan Current plan remains appropriate    Co-evaluation              AM-PAC PT "6 Clicks" Mobility   Outcome Measure  Help needed turning from your back to your side while in a flat bed without using bedrails?: A Little Help needed moving from lying on your back to sitting on the side of a flat bed without using bedrails?: A Little Help needed moving to and from a bed to a chair (including a wheelchair)?: A Little Help needed standing up from a chair using your arms (e.g., wheelchair or bedside  chair)?: A Little Help needed to walk in hospital room?: A Little Help needed climbing 3-5 steps with a railing? : A Lot 6 Click Score: 17    End of Session Equipment Utilized During Treatment: Back brace;Gait belt Activity Tolerance: Treatment limited secondary to medical complications (Comment) Patient left: in bed;with call bell/phone within reach;with bed alarm set Nurse Communication: Mobility status PT Visit Diagnosis: Other abnormalities of gait and mobility (R26.89);Difficulty in walking, not elsewhere classified (R26.2)     Time: 2725-3664 PT Time Calculation (min) (ACUTE ONLY): 21 min  Charges:  $Therapeutic Activity: 8-22 mins                     Annagrace Carr Pam Drown, PT Acute Rehabilitation Services Pager: [phone] Office: [place] 09/23/2018, 8:30 AM

## 2018-09-23 NOTE — Social Work (Signed)
Pt cousin sent email back with the following details.  "Hi Aleena Kirkeby,  Still not able to get up with Eddie Dibbles this morning. I've been in meeting or he's probably out working with PT. He is probably going to want [facility] b/c he's familiar with it and because it will be close for his Dad to visit. If you get to him before I do, would you let him know that you and I spoke and [facility] is on the list and if he wants to go there, I'll support/help him with whatever is needed. If he wants something else and you need to talk to me, shoot me a text and I'll give you a call.  Thank you! Alisa"  Have alerted [place] with this choice.  CSW continuing to follow for support with disposition when medically appropriate.  Westley Hummer, MSW, [place] Work [phone]

## 2018-09-23 NOTE — Care Management Important Message (Signed)
Important Message  Patient Details  Name: [name] White. MRN: [id number] Date of Birth: [birth year]   Medicare Important Message Given:  Yes    Brace Welte 09/23/2018, 3:28 PM

## 2018-09-23 NOTE — Progress Notes (Signed)
Patient's pain reasonably well controlled.  Still primarily incisional.  Patient still with some headache.  I took down his dressing and examined his back.  Currently the wound is clean and dry.  I can express no drainage or other problem.  Continue absolute bedrest and Diamox.  Hopefully will be able to manage his wound without having to divert CSF.

## 2018-09-24 NOTE — Progress Notes (Signed)
PT Cancellation Note  Patient Details Name: [name] White. MRN: [id number] DOB: 11-Jan-1966   Cancelled Treatment:    Reason Eval/Treat Not Completed: Active bedrest order   Sandy Salaam Lashia Niese 09/24/2018, 6:34 AM  Elwyn Reach, PT Acute Rehabilitation Services Pager: [phone] Office: [phone]

## 2018-09-24 NOTE — Progress Notes (Signed)
Subjective: Patient reports mild headache.  Objective: Vital signs in last 24 hours: Temp:  [99.1 F (37.3 C)-100.7 F (38.2 C)] 99.8 F (37.7 C) (02/07 2319) Pulse Rate:  [108-123] 123 (02/07 2319) Resp:  [12-24] 24 (02/07 2319) BP: (122-147)/(73-98) 127/84 (02/07 2319) SpO2:  [92 %-100 %] 93 % (02/07 2319)  Intake/Output from previous day: 02/07 0701 - 02/08 0700 In: 825 [P.O.:720; IV Piggyback:105] Out: 1350 [Urine:1350] Intake/Output this shift: No intake/output data recorded.  Physical Exam: No overt CSF drainage from wound.  Some bloody drainage on bandage.  Mild headache, improved from yesterday.  Worse when up.    Lab Results: No results for input(s): WBC, HGB, HCT, PLT in the last 72 hours. BMET No results for input(s): NA, K, CL, CO2, GLUCOSE, BUN, CREATININE, CALCIUM in the last 72 hours.  Studies/Results: No results found.  Assessment/Plan: Continue bedrest per Dr. Marchelle Folks treatment plan.  I have encouraged patient to remain flat and not to elevate his head of bed.    LOS: 4 days    Peggyann Shoals, MD 09/24/2018, 7:25 AM

## 2018-09-24 NOTE — Progress Notes (Signed)
Pt has order to order portable TV due to pt having to remain flat. Portable equipment and facilities stated they do not have portable TVs and that all TVs are mounted on the wall.

## 2018-09-25 NOTE — Progress Notes (Signed)
PT Cancellation Note  Patient Details Name: [name] White. MRN: [id number] DOB: 04-04-1966   Cancelled Treatment:    Reason Eval/Treat Not Completed: Active bedrest order   Bedrest order is still active; Will follow along for appropriateness to initiate mobility;   Roney Marion, [place] Pager [phone] Office [place] 09/25/2018, 6:49 AM

## 2018-09-25 NOTE — Progress Notes (Signed)
  NEUROSURGERY PROGRESS NOTE   No issues overnight. Unchanged HA.  EXAM:  BP (!) 145/93 (BP Location: Right Arm)   Pulse (!) 109   Temp 98.2 F (36.8 C) (Oral)   Resp 20   Ht 5\' 7"  (1.702 m)   Wt 90.3 kg   SpO2 97%   BMI 31.17 kg/m   Awake, alert, oriented  Speech fluent, appropriate  CN grossly intact  5/5 BUE/BLE  continues to have some CSF drainage primarily from superior aspect of wound  IMPRESSION:  53 y.o. male s/p lumbar decompression/fusion with CSF leak despite bedrest. May need temporary CSF diversion (EVD v Lumbar drain)  PLAN: - Cont bedrest  - Will get [facility] for possible EVD

## 2018-09-25 NOTE — Progress Notes (Signed)
Pt's honeycomb dressing on back incision saturated.  RNs can change PRN per Dr. Kathyrn Sheriff.

## 2018-09-26 ENCOUNTER — Encounter (HOSPITAL_COMMUNITY)
Admission: RE | Disposition: A | Discharge status: Skilled Nursing Facility | Payer: Self-pay | Source: Home / Self Care | Attending: Neurosurgery

## 2018-09-26 ENCOUNTER — Inpatient Hospital Stay (HOSPITAL_COMMUNITY): Payer: Medicare Other | Admitting: Certified Registered Nurse Anesthetist

## 2018-09-26 ENCOUNTER — Encounter (HOSPITAL_COMMUNITY): Payer: Self-pay | Admitting: Orthopedic Surgery

## 2018-09-26 ENCOUNTER — Inpatient Hospital Stay (HOSPITAL_COMMUNITY): Payer: Medicare Other

## 2018-09-26 HISTORY — PX: LUMBAR WOUND DEBRIDEMENT: SHX1988

## 2018-09-26 LAB — BASIC METABOLIC PANEL
Anion gap: 10 (ref 5–15)
BUN: 20 mg/dL (ref 6–20)
CO2: 22 mmol/L (ref 22–32)
Calcium: 8.8 mg/dL — ABNORMAL LOW (ref 8.9–10.3)
Chloride: 106 mmol/L (ref 98–111)
Creatinine, Ser: 0.97 mg/dL (ref 0.61–1.24)
GFR calc Af Amer: >60 mL/min (ref >60)
GFR calc non Af Amer: >60 mL/min (ref >60)
Glucose, Bld: 180 mg/dL — ABNORMAL HIGH (ref 70–99)
Potassium: 3.4 mmol/L — ABNORMAL LOW (ref 3.5–5.1)
Sodium: 138 mmol/L (ref 135–145)

## 2018-09-26 LAB — CBC
HCT: 38.6 % — ABNORMAL LOW (ref 39.0–52.0)
Hemoglobin: 12.3 g/dL — ABNORMAL LOW (ref 13.0–17.0)
MCH: 30 pg (ref 26.0–34.0)
MCHC: 31.9 g/dL (ref 30.0–36.0)
MCV: 94.1 fL (ref 80.0–100.0)
Platelets: 350 10*3/uL (ref 150–400)
RBC: 4.1 MIL/uL — ABNORMAL LOW (ref 4.22–5.81)
RDW: 13.2 % (ref 11.5–15.5)
WBC: 9.4 10*3/uL (ref 4.0–10.5)
nRBC: 0 % (ref 0.0–0.2)

## 2018-09-26 SURGERY — LUMBAR WOUND DEBRIDEMENT
Anesthesia: General

## 2018-09-26 MED ORDER — OXYCODONE HCL 5 MG/5ML PO SOLN: 5.0000 mg | Freq: Once | ORAL | Status: AC | PRN

## 2018-09-26 MED ORDER — DEXAMETHASONE SODIUM PHOSPHATE 10 MG/ML IJ SOLN
INTRAMUSCULAR | Status: DC | PRN
  Administered 2018-09-26: 4 mg via INTRAVENOUS

## 2018-09-26 MED ORDER — ONDANSETRON HCL 4 MG/2ML IJ SOLN
INTRAMUSCULAR | Status: DC | PRN
  Administered 2018-09-26: 4 mg via INTRAVENOUS

## 2018-09-26 MED ORDER — ROCURONIUM BROMIDE 10 MG/ML (PF) SYRINGE
PREFILLED_SYRINGE | INTRAVENOUS | Status: DC | PRN
  Administered 2018-09-26: 20 mg via INTRAVENOUS
  Administered 2018-09-26: 10 mg via INTRAVENOUS
  Administered 2018-09-26: 50 mg via INTRAVENOUS

## 2018-09-26 MED ORDER — ROCURONIUM BROMIDE 50 MG/5ML IV SOSY
PREFILLED_SYRINGE | INTRAVENOUS | Status: AC
  Filled 2018-09-26: qty 5

## 2018-09-26 MED ORDER — SODIUM CHLORIDE 0.9 % IV SOLN
2.0000 g | Freq: Two times a day (BID) | INTRAVENOUS | Status: DC
  Administered 2018-09-26 – 2018-10-04 (×16): 2 g via INTRAVENOUS
  Filled 2018-09-26 (×17): qty 20

## 2018-09-26 MED ORDER — FENTANYL CITRATE (PF) 250 MCG/5ML IJ SOLN
INTRAMUSCULAR | Status: AC
  Filled 2018-09-26: qty 5

## 2018-09-26 MED ORDER — DEXMEDETOMIDINE HCL 200 MCG/2ML IV SOLN
INTRAVENOUS | Status: DC | PRN
  Administered 2018-09-26: 8 ug via INTRAVENOUS
  Administered 2018-09-26: 4 ug via INTRAVENOUS
  Administered 2018-09-26: 8 ug via INTRAVENOUS

## 2018-09-26 MED ORDER — LIDOCAINE 2% (20 MG/ML) 5 ML SYRINGE
INTRAMUSCULAR | Status: DC | PRN
  Administered 2018-09-26 (×2): 50 mg via INTRAVENOUS

## 2018-09-26 MED ORDER — VANCOMYCIN HCL 1000 MG IV SOLR
INTRAVENOUS | Status: DC | PRN
  Administered 2018-09-26: 1000 mg via INTRAVENOUS

## 2018-09-26 MED ORDER — OXYCODONE HCL 5 MG PO TABS
5.0000 mg | ORAL_TABLET | Freq: Once | ORAL | Status: AC | PRN
  Administered 2018-09-26: 5 mg via ORAL

## 2018-09-26 MED ORDER — THROMBIN 5000 UNITS EX SOLR
CUTANEOUS | Status: DC | PRN
  Administered 2018-09-26 (×2): 5000 [IU] via TOPICAL

## 2018-09-26 MED ORDER — VANCOMYCIN HCL 1000 MG IV SOLR
INTRAVENOUS | Status: DC | PRN
  Administered 2018-09-26: 1000 mg

## 2018-09-26 MED ORDER — MIDAZOLAM HCL 2 MG/2ML IJ SOLN
INTRAMUSCULAR | Status: AC
  Filled 2018-09-26: qty 2

## 2018-09-26 MED ORDER — FENTANYL CITRATE (PF) 100 MCG/2ML IJ SOLN
25.0000 ug | INTRAMUSCULAR | Status: DC | PRN
  Administered 2018-09-26 (×3): 50 ug via INTRAVENOUS

## 2018-09-26 MED ORDER — FENTANYL CITRATE (PF) 100 MCG/2ML IJ SOLN
INTRAMUSCULAR | Status: DC | PRN
  Administered 2018-09-26 (×2): 25 ug via INTRAVENOUS
  Administered 2018-09-26: 100 ug via INTRAVENOUS

## 2018-09-26 MED ORDER — FENTANYL CITRATE (PF) 100 MCG/2ML IJ SOLN
INTRAMUSCULAR | Status: AC
  Filled 2018-09-26: qty 2

## 2018-09-26 MED ORDER — DEXAMETHASONE SODIUM PHOSPHATE 10 MG/ML IJ SOLN
INTRAMUSCULAR | Status: AC
  Filled 2018-09-26: qty 1

## 2018-09-26 MED ORDER — LIDOCAINE 2% (20 MG/ML) 5 ML SYRINGE
INTRAMUSCULAR | Status: AC
  Filled 2018-09-26: qty 5

## 2018-09-26 MED ORDER — VANCOMYCIN HCL 1000 MG IV SOLR: INTRAVENOUS | Status: DC | PRN

## 2018-09-26 MED ORDER — PROPOFOL 10 MG/ML IV BOLUS
INTRAVENOUS | Status: DC | PRN
  Administered 2018-09-26: 130 mg via INTRAVENOUS

## 2018-09-26 MED ORDER — 0.9 % SODIUM CHLORIDE (POUR BTL) OPTIME
TOPICAL | Status: DC | PRN
  Administered 2018-09-26: 1000 mL

## 2018-09-26 MED ORDER — LACTATED RINGERS IV SOLN
INTRAVENOUS | Status: DC
  Administered 2018-09-26 (×2): via INTRAVENOUS

## 2018-09-26 MED ORDER — SUGAMMADEX SODIUM 200 MG/2ML IV SOLN
INTRAVENOUS | Status: DC | PRN
  Administered 2018-09-26: 180.6 mg via INTRAVENOUS

## 2018-09-26 MED ORDER — ONDANSETRON HCL 4 MG/2ML IJ SOLN
INTRAMUSCULAR | Status: AC
  Filled 2018-09-26: qty 2

## 2018-09-26 MED ORDER — PROPOFOL 10 MG/ML IV BOLUS
INTRAVENOUS | Status: AC
  Filled 2018-09-26: qty 20

## 2018-09-26 MED ORDER — OXYCODONE HCL 5 MG PO TABS
ORAL_TABLET | ORAL | Status: AC
  Filled 2018-09-26: qty 1

## 2018-09-26 MED ORDER — LACTATED RINGERS IV SOLN
INTRAVENOUS | Status: DC | PRN
  Administered 2018-09-26: 15:00:00 via INTRAVENOUS

## 2018-09-26 MED ORDER — HEMOSTATIC AGENTS (NO CHARGE) OPTIME
TOPICAL | Status: DC | PRN
  Administered 2018-09-26: 1 via TOPICAL

## 2018-09-26 MED ORDER — SUCCINYLCHOLINE CHLORIDE 20 MG/ML IJ SOLN
INTRAMUSCULAR | Status: DC | PRN
  Administered 2018-09-26: 180 mg via INTRAVENOUS

## 2018-09-26 MED ORDER — LABETALOL HCL 5 MG/ML IV SOLN
INTRAVENOUS | Status: DC | PRN
  Administered 2018-09-26: 10 mg via INTRAVENOUS

## 2018-09-26 MED ORDER — SODIUM CHLORIDE 0.9 % IV SOLN
INTRAVENOUS | Status: DC | PRN
  Administered 2018-09-26: 17:00:00

## 2018-09-26 MED ORDER — BUPIVACAINE HCL (PF) 0.25 % IJ SOLN
INTRAMUSCULAR | Status: AC
  Filled 2018-09-26: qty 30

## 2018-09-26 MED ORDER — ONDANSETRON HCL 4 MG/2ML IJ SOLN: 4.0000 mg | Freq: Once | INTRAMUSCULAR | Status: DC | PRN

## 2018-09-26 MED ORDER — THROMBIN 5000 UNITS EX SOLR
CUTANEOUS | Status: AC
  Filled 2018-09-26: qty 10000

## 2018-09-26 MED ORDER — VANCOMYCIN HCL 1000 MG IV SOLR
INTRAVENOUS | Status: AC
  Filled 2018-09-26: qty 1000

## 2018-09-26 MED ORDER — LABETALOL HCL 5 MG/ML IV SOLN
INTRAVENOUS | Status: AC
  Filled 2018-09-26: qty 4

## 2018-09-26 MED FILL — Sodium Chloride IV Soln 0.9%: INTRAVENOUS | Qty: 1000 | Status: AC

## 2018-09-26 MED FILL — Heparin Sodium (Porcine) Inj 1000 Unit/ML: INTRAMUSCULAR | Qty: 30 | Status: AC

## 2018-09-26 SURGICAL SUPPLY — 56 items
ADH SKN CLS APL DERMABOND .7 (GAUZE/BANDAGES/DRESSINGS) ×1
APL SKNCLS STERI-STRIP NONHPOA (GAUZE/BANDAGES/DRESSINGS) ×1
BAG DECANTER FOR FLEXI CONT (MISCELLANEOUS) ×3 IMPLANT
BENZOIN TINCTURE PRP APPL 2/3 (GAUZE/BANDAGES/DRESSINGS) ×3 IMPLANT
BLADE CLIPPER SURG (BLADE) IMPLANT
CANISTER SUCT 3000ML PPV (MISCELLANEOUS) ×3 IMPLANT
CARTRIDGE OIL MAESTRO DRILL (MISCELLANEOUS) ×1 IMPLANT
CLOSURE WOUND 1/2 X4 (GAUZE/BANDAGES/DRESSINGS) ×1
COVER WAND RF STERILE (DRAPES) ×3 IMPLANT
DERMABOND ADVANCED (GAUZE/BANDAGES/DRESSINGS) ×2
DERMABOND ADVANCED .7 DNX12 (GAUZE/BANDAGES/DRESSINGS) ×1 IMPLANT
DIFFUSER DRILL AIR PNEUMATIC (MISCELLANEOUS) ×3 IMPLANT
DRAIN SUBARACHNOID (WOUND CARE) ×2 IMPLANT
DRAPE LAPAROTOMY 100X72X124 (DRAPES) ×3 IMPLANT
DRAPE POUCH INSTRU U-SHP 10X18 (DRAPES) ×3 IMPLANT
DRAPE SURG 17X23 STRL (DRAPES) IMPLANT
DRSG OPSITE POSTOP 4X10 (GAUZE/BANDAGES/DRESSINGS) ×2 IMPLANT
ELECT REM PT RETURN 9FT ADLT (ELECTROSURGICAL) ×3
ELECTRODE REM PT RTRN 9FT ADLT (ELECTROSURGICAL) ×1 IMPLANT
EVACUATOR 1/8 PVC DRAIN (DRAIN) IMPLANT
GAUZE 4X4 16PLY RFD (DISPOSABLE) IMPLANT
GAUZE SPONGE 4X4 12PLY STRL (GAUZE/BANDAGES/DRESSINGS) ×3 IMPLANT
GLOVE BIO SURGEON STRL SZ7.5 (GLOVE) ×2 IMPLANT
GLOVE ECLIPSE 9.0 STRL (GLOVE) ×3 IMPLANT
GLOVE EXAM NITRILE XL STR (GLOVE) IMPLANT
GLOVE INDICATOR 7.5 STRL GRN (GLOVE) ×2 IMPLANT
GOWN STRL REUS W/ TWL LRG LVL3 (GOWN DISPOSABLE) IMPLANT
GOWN STRL REUS W/ TWL XL LVL3 (GOWN DISPOSABLE) ×1 IMPLANT
GOWN STRL REUS W/TWL 2XL LVL3 (GOWN DISPOSABLE) IMPLANT
GOWN STRL REUS W/TWL LRG LVL3 (GOWN DISPOSABLE) ×3
GOWN STRL REUS W/TWL XL LVL3 (GOWN DISPOSABLE) ×6
GRAFT DURAGEN MATRIX 1WX1L (Tissue) ×2 IMPLANT
KIT BASIN OR (CUSTOM PROCEDURE TRAY) ×3 IMPLANT
KIT TURNOVER KIT B (KITS) ×3 IMPLANT
NDL HYPO 25X1 1.5 SAFETY (NEEDLE) IMPLANT
NEEDLE HYPO 25X1 1.5 SAFETY (NEEDLE) ×3 IMPLANT
NS IRRIG 1000ML POUR BTL (IV SOLUTION) ×3 IMPLANT
OIL CARTRIDGE MAESTRO DRILL (MISCELLANEOUS) ×3
PACK LAMINECTOMY NEURO (CUSTOM PROCEDURE TRAY) ×3 IMPLANT
SEALANT ADHERUS EXTEND TIP (MISCELLANEOUS) ×2 IMPLANT
SPONGE SURGIFOAM ABS GEL SZ50 (HEMOSTASIS) ×3 IMPLANT
STRIP CLOSURE SKIN 1/2X4 (GAUZE/BANDAGES/DRESSINGS) ×2 IMPLANT
SUT ETHILON 2 0 FS 18 (SUTURE) ×14 IMPLANT
SUT PDS AB 0 CT 36 (SUTURE) ×2 IMPLANT
SUT VIC AB 0 CT1 18XCR BRD8 (SUTURE) ×1 IMPLANT
SUT VIC AB 0 CT1 8-18 (SUTURE) ×3
SUT VIC AB 1 CT1 18XBRD ANBCTR (SUTURE) IMPLANT
SUT VIC AB 1 CT1 8-18 (SUTURE) ×9
SUT VIC AB 2-0 CT1 18 (SUTURE) ×3 IMPLANT
SWAB COLLECTION DEVICE MRSA (MISCELLANEOUS) ×1 IMPLANT
SWAB CULTURE ESWAB REG 1ML (MISCELLANEOUS) ×3 IMPLANT
SYR CONTROL 10ML LL (SYRINGE) ×2 IMPLANT
SYSTEM LIMITORR VOL LMT 20ML (CATHETERS) ×2 IMPLANT
TOWEL GREEN STERILE (TOWEL DISPOSABLE) ×3 IMPLANT
TOWEL GREEN STERILE FF (TOWEL DISPOSABLE) ×3 IMPLANT
WATER STERILE IRR 1000ML POUR (IV SOLUTION) ×3 IMPLANT

## 2018-09-26 NOTE — Progress Notes (Signed)
Patient transferred to rm 4N21, receiving RN at bedside, family aware of patients transfer via volunteers. VS stable, no questions concerns from receiving RN.  Rowe Pavy, RN

## 2018-09-26 NOTE — Plan of Care (Signed)
Instructed patient goal is t get him to take po pain as soon as he can for pain relief understanding he has had multiple surgeries befor this one.  Patient tolerates large does of opiates at one time.

## 2018-09-26 NOTE — Op Note (Signed)
Date of procedure: 09/26/2018  Date of dictation: Same  Service: Neurosurgery  Preoperative diagnosis: Postoperative lumbar CSF fistula  Postoperative diagnosis: Same  Procedure Name: Reexploration of lumbar wound.  Placement of lumbar subarachnoid drain  Surgeon:Tiyonna Sardinha A.Cedarius Kersh, M.D.  Asst. Surgeon: None  Anesthesia: General  Indication: 53 year old male status post complex anterior posterior lumbar sacral fusion.  Patient also with decompression at L2-3 with known intraoperative CSF leak secondary to incompetent dura from his prior surgical fusion many years ago.  Initially did very well postoperatively but now has developed symptoms of postural headache and persistent wound drainage consistent with CSF.  Patient presents now for wound reexploration.  Operative note: After induction of anesthesia, patient position prone onto Wilson frame and appropriately padded.  Thoracic and lumbar region prepped and draped sterilely.  Incision made over the upper half of the patient's lumbar wound.  This carried down sharply in the midline.  Thin bloody fluid was found in the epidural space.  This was evacuated.  The area where he had his dural patch overlying the L2 laminectomy site showed some signs of continued leakage of CSF along the gutter.  As I knew there was no evidence of dura for which to do any type of surgical repair at that point I decided that his CSF would have to be diverted with a lumbar subarachnoid drain in order to try to get this to heal.  I placed a 14-gauge Touhy needle into the lumbar subarachnoid space around the L4 level.  The subarachnoid catheter passed reasonably easily.  I removed the needle and the wire.  Good flow was observed through the subarachnoid catheter.  I do feel the catheter likely passed into his thoracic subarachnoid space and I do not think this is had any violation of his L2 region.  Evacuation of CSF through this drain eliminated any further CSF coming from the  lateral gutter at L2.  I placed a small piece of DuraGen in the gutter and then put more adheris sealant overlying the dural repair.  The subarachnoid drain was exited through a tunneled incision into his left flank.  Vancomycin powder was placed in deep wound space.  Paraspinals muscles were close to the best of my ability using interrupted Vicryl sutures.  Lumbodorsal fascia was closed using Vicryl sutures.  Skin was then closed with interrupted 2-0 nylon in a vertical mattress fashion.  Sterile dressings were applied.  No apparent complications.  Patient tolerated procedure well and he returns to the recovery room and then the ICU postop.

## 2018-09-26 NOTE — Brief Op Note (Signed)
09/20/2018 - 09/26/2018  5:20 PM  PATIENT:  [name] White.  53 y.o. male  PRE-OPERATIVE DIAGNOSIS:  Postoperative Cerebral Spinal Fluid fistula  POST-OPERATIVE DIAGNOSIS:  Postoperative Cerebral Spinal Fluid fistula  PROCEDURE:  Procedure(s) with comments: LUMBAR WOUND DEBRIDEMENT Placement of lumbar drain (N/A) - LUMBAR WOUND DEBRIDEMENT Placement of lumbar drain  SURGEON:  Surgeon(s) and Role:    * Earnie Larsson, MD - Primary  PHYSICIAN ASSISTANT:   ASSISTANTS:    ANESTHESIA:   general  EBL:  100 mL   BLOOD ADMINISTERED:none  DRAINS: Lumbar subarachnoid drain   LOCAL MEDICATIONS USED:  NONE  SPECIMEN:  No Specimen  DISPOSITION OF SPECIMEN:  N/A  COUNTS:  YES  TOURNIQUET:  * No tourniquets in log *  DICTATION: .Dragon Dictation  PLAN OF CARE: Admit to inpatient   PATIENT DISPOSITION:  PACU - hemodynamically stable.   Delay start of Pharmacological VTE agent (>24hrs) due to surgical blood loss or risk of bleeding: yes

## 2018-09-26 NOTE — Anesthesia Postprocedure Evaluation (Signed)
Anesthesia Post Note  Patient: Deyvi Bonanno.  Procedure(s) Performed: LUMBAR WOUND DEBRIDEMENT Placement of lumbar drain (N/A )     Patient location during evaluation: PACU Anesthesia Type: General Level of consciousness: awake and alert Pain management: pain level controlled Vital Signs Assessment: post-procedure vital signs reviewed and stable Respiratory status: spontaneous breathing, nonlabored ventilation, respiratory function stable and patient connected to nasal cannula oxygen Cardiovascular status: blood pressure returned to baseline and stable Postop Assessment: no apparent nausea or vomiting Anesthetic complications: no    Last Vitals:  Vitals:   09/26/18 1805 09/26/18 1815  BP: (!) 172/112 (!) 155/98  Pulse: 99 98  Resp: 17 12  Temp:  36.5 C  SpO2: 99% 92%    Last Pain:  Vitals:   09/26/18 1830  TempSrc:   PainSc: 10-Worst pain ever                 Brailyn Killion COKER

## 2018-09-26 NOTE — Anesthesia Procedure Notes (Signed)
Procedure Name: Intubation Performed by: Milford Cage, CRNA Pre-anesthesia Checklist: Patient identified, Emergency Drugs available, Suction available and Patient being monitored Patient Re-evaluated:Patient Re-evaluated prior to induction Oxygen Delivery Method: Circle System Utilized Preoxygenation: Pre-oxygenation with 100% oxygen Induction Type: IV induction Laryngoscope Size: Glidescope and 4 Grade View: Grade I Tube type: Oral Tube size: 7.5 mm Number of attempts: 2 Airway Equipment and Method: Oral airway and Rigid stylet Placement Confirmation: ETT inserted through vocal cords under direct vision,  positive ETCO2 and breath sounds checked- equal and bilateral Secured at: 24 cm Tube secured with: Tape Dental Injury: Teeth and Oropharynx as per pre-operative assessment  Comments: Utilized glidescope due to hospital bed positioning and poor dentition

## 2018-09-26 NOTE — Progress Notes (Signed)
OT Cancellation Note  Patient Details Name: [name] White. MRN: [id number] DOB: 1966/07/11   Cancelled Treatment:    Reason Eval/Treat Not Completed: Active bedrest order, remains on bedrest with CSF drainage.  Will follow and resume OT when appropriate and able.   Delight Stare, OT Acute Rehabilitation Services Pager [phone] Office [phone]    Delight Stare 09/26/2018, 7:27 AM

## 2018-09-26 NOTE — Social Work (Signed)
Spoke with [employer] [employer], liaison Elyse Hsu. She is aware of updated care plans. They will still have bed for pt when medically ready.  CSW continuing to follow for support with disposition when medically appropriate.  Westley Hummer, MSW, [place] Work [phone]

## 2018-09-26 NOTE — Anesthesia Preprocedure Evaluation (Signed)
Anesthesia Evaluation  Patient identified by MRN, date of birth, ID band Patient awake    Reviewed: Allergy & Precautions, NPO status , Patient's Chart, lab work & pertinent test results  Airway Mallampati: II  TM Distance: >3 FB     Dental  (+) Partial Upper, Partial Lower, Loose,    Pulmonary Current Smoker,    breath sounds clear to auscultation       Cardiovascular hypertension,  Rhythm:Regular Rate:Normal     Neuro/Psych    GI/Hepatic   Endo/Other    Renal/GU      Musculoskeletal   Abdominal   Peds  Hematology   Anesthesia Other Findings   Reproductive/Obstetrics                             Anesthesia Physical Anesthesia Plan  ASA: II  Anesthesia Plan: General   Post-op Pain Management:    Induction: Intravenous  PONV Risk Score and Plan: Ondansetron and Dexamethasone  Airway Management Planned: Oral ETT  Additional Equipment:   Intra-op Plan:   Post-operative Plan: Extubation in OR  Informed Consent: I have reviewed the patients History and Physical, chart, labs and discussed the procedure including the risks, benefits and alternatives for the proposed anesthesia with the patient or authorized representative who has indicated his/her understanding and acceptance.     Dental advisory given  Plan Discussed with: CRNA and Anesthesiologist  Anesthesia Plan Comments: (Patient with loose upper front teeth, discussed possibility that his teeth could be dislodged on intubation. He understands and agrees to proceed.)        Anesthesia Quick Evaluation

## 2018-09-26 NOTE — Progress Notes (Signed)
Overall stable.  Patient still having some intermittent CSF leakage from his lumbar wound.  Still having some headache.  Overall doing well otherwise.  No fever.  No evidence of infection.  Wound otherwise healing well.  Motor and sensory function stable.  Abdomen soft.  Continued CSF leak.  At this point would like to reexplore his lumbar wound and see if there is anything I can close directly and place a lumbar drain for CSF diversion.  I discussed situation with the patient.  He understands.  He wishes to proceed with surgery.

## 2018-09-26 NOTE — Progress Notes (Signed)
PT Cancellation Note  Patient Details Name: [name] White. MRN: [id number] DOB: Jan 15, 1966   Cancelled Treatment:    Reason Eval/Treat Not Completed: Active bedrest order   [name] 09/26/2018, 6:39 AM Elwyn Reach, PT Acute Rehabilitation Services Pager: [phone] Office: [phone]

## 2018-09-26 NOTE — Transfer of Care (Signed)
Immediate Anesthesia Transfer of Care Note  Patient: [name] White.  Procedure(s) Performed: LUMBAR WOUND DEBRIDEMENT Placement of lumbar drain (N/A )  Patient Location: PACU  Anesthesia Type:General  Level of Consciousness: drowsy  Airway & Oxygen Therapy: Patient Spontanous Breathing  Post-op Assessment: Report given to RN and Post -op Vital signs reviewed and stable  Post vital signs: Reviewed and stable  Last Vitals:  Vitals Value Taken Time  BP 160/101 09/26/2018  5:31 PM  Temp    Pulse 102 09/26/2018  5:35 PM  Resp 21 09/26/2018  5:36 PM  SpO2 94 % 09/26/2018  5:35 PM  Vitals shown include unvalidated device data.  Last Pain:  Vitals:   09/26/18 0754  TempSrc: Oral  PainSc:       Patients Stated Pain Goal: 4 (61/47/09 2957)  Complications: No apparent anesthesia complications

## 2018-09-27 ENCOUNTER — Encounter (HOSPITAL_COMMUNITY): Payer: Self-pay | Admitting: Neurosurgery

## 2018-09-27 NOTE — Progress Notes (Signed)
Overall stable.  Back pain continues to be mostly incisional.  Minimal radicular pain.  Headache stable.  Drain working well.  Lumbar wound clean and dry.  Neurologically stable otherwise.  Continue Rocephin and lumbar drain.  Continue bedrest.  Likely will need lumbar drainage for at least 1 week.

## 2018-09-28 NOTE — Social Work (Signed)
CSW spoke with [name] White at admissions with [facility].  Pt still has a bed there whenever he is medically ready.  Westley Hummer, MSW, [place] Work [phone]

## 2018-09-28 NOTE — Progress Notes (Signed)
No new issues or problems.  Pain better controlled.  Less headache.  Lumbar drain working well.  His wound is clean and dry.  CSF xanthochromic but otherwise clear.  Patient is afebrile.  His neurologic exam is stable.  His abdomen is soft.  Continue current management of continuous lumbar drainage with prophylactic Rocephin.

## 2018-09-29 NOTE — Progress Notes (Signed)
No new issues or problems.  Overall tolerating the drain reasonably well.  Drain operating at about 15 cc/h.  His wound is staying clean and dry.  His neurologic exam is stable.  His abdomen is soft.  Continue bedrest, antibiotics and lumbar drainage.

## 2018-09-29 NOTE — Social Work (Signed)
CSW continuing to follow for support with disposition when medically appropriate.  Creta Dorame, MSW, LCSWA [place] Clinical Social Work [phone]   

## 2018-09-30 NOTE — Progress Notes (Addendum)
Patient arrived to unit, vitals stable. Continue to monitor patient. Pt states no bowel movement since admission, refuses PRN medication, RN attempted education; pt states MD aware.

## 2018-09-30 NOTE — Progress Notes (Signed)
Overall stable.  Continues to tolerate lumbar drainage well.  Minimal headache.  Back pain controlled.  No new lower extremity symptoms.  Abdomen soft.  Positive flatus.  No BM.  He is afebrile.  Vital signs are stable.  Lumbar drain continues to put out about 15 cc/h.  Fluid xanthochromic but otherwise clear.  Awake and alert.  Oriented and appropriate.  Motor and sensory function stable.  Wound clean and dry.  Overall progressing well.  Continue bedrest and lumbar drainage.  Plan to clamp lumbar drain Tuesday morning and begin to slowly mobilize.

## 2018-10-01 MED ORDER — DIPHENHYDRAMINE HCL 25 MG PO CAPS
50.0000 mg | ORAL_CAPSULE | Freq: Every evening | ORAL | Status: DC | PRN
  Administered 2018-10-01 – 2018-10-06 (×6): 50 mg via ORAL
  Filled 2018-10-01 (×6): qty 2

## 2018-10-01 NOTE — Progress Notes (Signed)
   Providing Compassionate, Quality Care - Together   Subjective: Patient reports problems with sleep. He reports he takes 50 mg Benadryl at home to help him sleep. He reports he is passing flatus.  Objective: Vital signs in last 24 hours: Temp:  [97.9 F (36.6 C)-98.6 F (37 C)] 98.5 F (36.9 C) (02/15 0800) Pulse Rate:  [90-102] 100 (02/15 0800) Resp:  [11-17] 16 (02/15 0800) BP: (98-123)/(61-84) 121/79 (02/15 0800) SpO2:  [95 %-100 %] 97 % (02/15 0800)  Intake/Output from previous day: 02/14 0701 - 02/15 0700 In: 480.1 [P.O.:480; I.V.:0.1] Out: 995 [Urine:700; Drains:295] Intake/Output this shift: Total I/O In: 468.9 [P.O.:360; I.V.:8.9; IV Piggyback:100] Out: 48 [Drains:48]  Alert and oriented x 4 MAE Strength 5/5 BUE, BLE PERRLA Abdomen soft Wound clean, dry, and intact; lumbar drain in place; continues to drain 15 mL/hr clear, straw colored CSF  Lab Results: No results for input(s): WBC, HGB, HCT, PLT in the last 72 hours. BMET No results for input(s): NA, K, CL, CO2, GLUCOSE, BUN, CREATININE, CALCIUM in the last 72 hours.  Studies/Results: No results found.  Assessment/Plan: Mr. Milliron is 11 days s/p L5-S1 redo decompressive laminectomy with bilateral L5 and S1 redo decompressive foraminotomies, L2-3 redo decompressive laminectomy, L5-S1 posterior lateral arthrodesis utilizing segmental pedicle screw fixation from L3-S2 with Mazor guided S1 pedicle screw, S2 alar iliac wing screw placement bilaterally. He is 5 days s/p reexploration of lumbar wound, with placement of lumbar subarachnoid drain. He continues to remain on bedrest.   LOS: 11 days    -Plan is to clamp drain on Tuesday -Will increase Benadryl to patient's bedtime home dose of 50 mg -Continue current plan for pain management   Viona Gilmore, DNP, AGNP-C Nurse Practitioner  [facility] [place]. [address], [address], [place], [place] [address] P: [phone]    F:  [phone]  10/01/2018, 9:31 AM

## 2018-10-02 NOTE — Plan of Care (Signed)

## 2018-10-02 NOTE — Progress Notes (Signed)
   Providing Compassionate, Quality Care - Together   Subjective: Patient reports he had a bowel movement overnight. He feels his abdomen is "going down."  Objective: Vital signs in last 24 hours: Temp:  [97.8 F (36.6 C)-99.1 F (37.3 C)] 99.1 F (37.3 C) (02/16 0821) Pulse Rate:  [100-117] 114 (02/16 0821) Resp:  [11-23] 20 (02/16 0821) BP: (117-142)/(70-99) 142/94 (02/16 0821) SpO2:  [95 %-97 %] 97 % (02/16 0821)  Intake/Output from previous day: 02/15 0701 - 02/16 0700 In: 1016.6 [P.O.:800; I.V.:16.6; IV Piggyback:200] Out: 1044 [Urine:700; Drains:344] Intake/Output this shift: Total I/O In: 408.3 [P.O.:240; I.V.:68.3; IV Piggyback:100] Out: 585 [Urine:550; Drains:35]  Alert and oriented x 4 MAE Strength 5/5 BUE, BLE PERRLA Abdomen soft, less distended today Wound clean, dry, and intact; lumbar drain in place; continues to drain 15 mL/hr clear, straw colored CSF  Lab Results: No results for input(s): WBC, HGB, HCT, PLT in the last 72 hours. BMET No results for input(s): NA, K, CL, CO2, GLUCOSE, BUN, CREATININE, CALCIUM in the last 72 hours.  Studies/Results: No results found.  Assessment/Plan: [name] White is 12 days s/p L5-S1 redo decompressive laminectomy with bilateral L5 and S1 redo decompressive foraminotomies, L2-3 redo decompressive laminectomy, L5-S1 posterior lateral arthrodesis utilizing segmental pedicle screw fixation from L3-S2 with Mazor guided S1 pedicle screw, S2 alar iliac wing screw placement bilaterally. He is 6 days s/p reexploration of lumbar wound, with placement of lumbar subarachnoid drain. He continues to remain on bedrest.   LOS: 12 days    -Plan is to clamp drain on Tuesday -Continue current plan for pain management   Viona Gilmore, DNP, AGNP-C Nurse Practitioner  [facility] [place]. [address], [address], [place], [place] [address] P: [phone]    F: [phone]  10/02/2018, 9:16 AM

## 2018-10-03 MED ORDER — PROPOFOL 500 MG/50ML IV EMUL
INTRAVENOUS | Status: AC
  Filled 2018-10-03: qty 200

## 2018-10-03 NOTE — Plan of Care (Signed)

## 2018-10-03 NOTE — Social Work (Signed)
CSW continuing to follow for support with disposition when medically appropriate.  Tallen Schnorr, MSW, LCSWA [place] Clinical Social Work [phone]   

## 2018-10-03 NOTE — Progress Notes (Signed)
   Providing Compassionate, Quality Care - Together   Subjective: Patient reports no issues overnight. He is anxious to sit up and ambulate. Nursing report no drainage form incision.  Objective: Vital signs in last 24 hours: Temp:  [98.4 F (36.9 C)-99.2 F (37.3 C)] 98.4 F (36.9 C) (02/17 0811) Pulse Rate:  [99-107] 102 (02/17 0811) Resp:  [16-20] 20 (02/17 0811) BP: (135-159)/(82-100) 142/82 (02/17 0811) SpO2:  [95 %-99 %] 95 % (02/17 0811)  Intake/Output from previous day: 02/16 0701 - 02/17 0700 In: 1813.8 [P.O.:1405; I.V.:108.8; IV Piggyback:300] Out: 2070 [Urine:1750; Drains:320] Intake/Output this shift: Total I/O In: 0  Out: 65 [Drains:65]  Alert and oriented x 4 MAE Strength 5/5 BUE, BLE PERRLA Abdomen soft, less distended today Wound clean, dry, and intact; lumbar drain in place; continues to drain 15 mL/hr clear, straw colored CSF  Lab Results: No results for input(s): WBC, HGB, HCT, PLT in the last 72 hours. BMET No results for input(s): NA, K, CL, CO2, GLUCOSE, BUN, CREATININE, CALCIUM in the last 72 hours.  Studies/Results: No results found.  Assessment/Plan: [name] White is 13 days s/pL5-S1 redo decompressive laminectomy with bilateral L5 and S1 redo decompressive foraminotomies,L2-3 redo decompressive laminectomy,L5-S1 posterior lateral arthrodesis utilizing segmental pedicle screw fixation from L3-S2withMazor guided S1 pedicle screw, S2 alar iliac wing screw placement bilaterally. He is 7 days s/p reexploration of lumbar wound, with placement of lumbar subarachnoid drain. He continues to remain on bedrest.   LOS: 13 days    -Plan is to clamp drain on Tuesday -Continue current plan for pain management   Viona Gilmore, DNP, AGNP-C Nurse Practitioner  [facility] [place]. [address], [address], [place], [place] [address] P: [phone]    F: [phone]  10/03/2018, 12:11 PM

## 2018-10-04 MED ORDER — CISATRACURIUM BESYLATE 20 MG/10ML IV SOLN
INTRAVENOUS | Status: AC
  Filled 2018-10-04: qty 20

## 2018-10-04 NOTE — Care Management Important Message (Signed)
Important Message  Patient Details  Name: [name] White. MRN: [id number] Date of Birth: February 12, 1966   Medicare Important Message Given:  Yes    Barb Merino Dawnette Mione 10/04/2018, 12:20 PM

## 2018-10-04 NOTE — Progress Notes (Signed)
Spinal drain has been pulled.   Patient can advance to a sitting position before ambulating in order to minimize headache.   Patient may bathe.  Patient still needs to continue on a bedrest status.

## 2018-10-04 NOTE — Care Management Note (Signed)
Case Management Note  Patient Details  Name: [name] White. MRN: [id number] Date of Birth: 16-May-1966  Subjective/Objective:  Pt is a 53 yo male sp L2-3 redo decmpression laminectomy; L5-S1 redo decompression laminectomy with bl decompression foraminotomies, L5-S1 posterior lateral arthrodesis with peduncle screw fixation L5-S2.  PTA, pt independent with assistive devices; lives with and cares for father at home.                  Action/Plan: PT/OT recommending SNF.  Pt has a bed at [facility], per CSW arrangements, upon medical stability.   Expected Discharge Date:                  Expected Discharge Plan:  Skilled Nursing Facility  In-House Referral:  Clinical Social Work  Discharge planning Services  CM Consult  Post Acute Care Choice:    Choice offered to:     DME Arranged:    DME Agency:     HH Arranged:    HH Agency:  [facility]  Status of Service:  In process, will continue to follow  If discussed at Long Length of Stay Meetings, dates discussed:    Additional Comments:  Reinaldo Raddle, RN, BSN  Trauma/Neuro ICU Case Manager [phone]

## 2018-10-04 NOTE — Progress Notes (Signed)
Upon assessment patient states that he has had more than 20 surgeries and has been on pain medication for a few decades with pain never relieved. Patient states that his pain is constantly a 9/10 on the pain scale. Patient educated by Nurse about oral contraceptives being given 1st and then IV if no relief. Note left in Physician section to please clean up MAR. Patient B/p this a.m. @ 0400 was 103/69, patient educated that pain medication will be held by Nurse contingent on respirations, SPO2, and/or blood pressure.   Guided imagery and breathing techniques given to patient. Tylenol is also on board but has not been given since 09/26/2018.

## 2018-10-04 NOTE — Progress Notes (Signed)
Overall stable.  Patient denies headache.  No wound drainage.  Lumbar drain removed.  Plan to start to slowly mobilize.

## 2018-10-05 NOTE — Evaluation (Signed)
Physical Therapy Evaluation Patient Details Name: [name] White. MRN: [id number] DOB: 12-20-1965 Today's Date: 10/05/2018   History of Present Illness  Pt is a 53 yo male sp L2-3 redo decmpression laminectomy; L5-S1 redo decompression laminectomy with bl decompression foraminotomies, L5-S1 posterior lateral arthrodesis with peduncle screw fixation L5-S2. Pmhx: multiple back surgeries, scoliosis, degenerative disc dx, afib, chronic pain syndrome, HLD, HTN.  Complicated by CSF leak with 8 days of bedrest.   Clinical Impression  Patient mobilizing with some issues of balance, endurance, activity tolerance and pain.  Continues to demonstrate need for SNF level rehab at d/c.  Noted no issues with draining from incision.  Patient will benefit from skilled PT in the acute setting to resume prior to d/c to SNF.    Follow Up Recommendations SNF;Supervision/Assistance - 24 hour    Equipment Recommendations  None recommended by PT    Recommendations for Other Services       Precautions / Restrictions Precautions Precautions: Back Precaution Comments:  Rt foot drop Required Braces or Orthoses: Spinal Brace Spinal Brace: Lumbar corset;Applied in sitting position      Mobility  Bed Mobility Overal bed mobility: Needs Assistance Bed Mobility: Rolling;Sidelying to Sit;Sit to Sidelying Rolling: Supervision Sidelying to sit: Supervision;HOB elevated     Sit to sidelying: Min assist General bed mobility comments: heavy reliance on railing for rolling and side to sit with increased time  Transfers Overall transfer level: Needs assistance Equipment used: Rolling walker (2 wheeled) Transfers: Sit to/from Stand Sit to Stand: Min guard;From elevated surface         General transfer comment: cues for hand placement, up from EOB, then on and off 3:1 in bathroom to toilet  Ambulation/Gait Ambulation/Gait assistance: Min guard Gait Distance (Feet): 125 Feet Assistive device: Rolling walker  (2 wheeled) Gait Pattern/deviations: Step-through pattern;Step-to pattern;Decreased stride length;Wide base of support     General Gait Details: in hallway and cues increased time espeically on turns  Stairs            Wheelchair Mobility    Modified Rankin (Stroke Patients Only)       Balance Overall balance assessment: Needs assistance Sitting-balance support: Feet supported;No upper extremity supported Sitting balance-Leahy Scale: Good Sitting balance - Comments: EOB   Standing balance support: No upper extremity supported Standing balance-Leahy Scale: Poor Standing balance comment: in standing attempting to don gown and brace with LOB posterior when unsupported                             Pertinent Vitals/Pain Faces Pain Scale: Hurts even more Pain Location: back Pain Descriptors / Indicators: Aching;Operative site guarding Pain Intervention(s): Monitored during session;Repositioned    Home Living Family/patient expects to be discharged to:: Private residence Living Arrangements: Parent Available Help at Discharge: Family;Friend(s);Available PRN/intermittently Type of Home: House Home Access: Ramped entrance     Home Layout: One level Home Equipment: Bedside commode;Walker - 2 wheels Additional Comments: 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    Prior Function Level of Independence: Independent with assistive device(s)               Hand Dominance        Extremity/Trunk Assessment   Upper Extremity Assessment Upper Extremity Assessment: Overall WFL for tasks assessed    Lower Extremity Assessment RLE Deficits / Details: AROM WFL, strength hip flexion 3+/5, knee extension 4/5 foot drop on R LLE Deficits / Details: AROM WFL, strength  grossly 4/5    Cervical / Trunk Assessment Cervical / Trunk Assessment: Other exceptions Cervical / Trunk Exceptions: significant scoliosis from youth  Communication   Communication: No difficulties   Cognition Arousal/Alertness: Awake/alert Behavior During Therapy: WFL for tasks assessed/performed Overall Cognitive Status: Within Functional Limits for tasks assessed                                        General Comments General comments (skin integrity, edema, etc.): toileted on 3:1 over toilet and able to complete toilet hygiene appropriately following back precautions    Exercises     Assessment/Plan    PT Assessment Patient needs continued PT services  PT Problem List Decreased strength;Decreased mobility;Decreased activity tolerance;Decreased balance;Pain;Decreased knowledge of use of DME;Decreased knowledge of precautions       PT Treatment Interventions DME instruction;Therapeutic exercise;Gait training;Balance training;Functional mobility training;Therapeutic activities;Patient/family education    PT Goals (Current goals can be found in the Care Plan section)  Acute Rehab PT Goals Patient Stated Goal: continue to do rehab prior to d/c home PT Goal Formulation: With patient Time For Goal Achievement: 10/12/18 Potential to Achieve Goals: Good    Frequency Min 3X/week   Barriers to discharge        Co-evaluation               AM-PAC PT "6 Clicks" Mobility  Outcome Measure Help needed turning from your back to your side while in a flat bed without using bedrails?: A Little Help needed moving from lying on your back to sitting on the side of a flat bed without using bedrails?: A Little Help needed moving to and from a bed to a chair (including a wheelchair)?: A Little Help needed standing up from a chair using your arms (e.g., wheelchair or bedside chair)?: A Little Help needed to walk in hospital room?: A Little Help needed climbing 3-5 steps with a railing? : A Little 6 Click Score: 18    End of Session Equipment Utilized During Treatment: Back brace Activity Tolerance: Patient tolerated treatment well Patient left: in bed;with call  bell/phone within reach   PT Visit Diagnosis: Other abnormalities of gait and mobility (R26.89);Difficulty in walking, not elsewhere classified (R26.2)    Time: 7322-0254 PT Time Calculation (min) (ACUTE ONLY): 42 min   Charges:   PT Evaluation $PT Re-evaluation: 1 Re-eval PT Treatments $Gait Training: 8-22 mins $Therapeutic Activity: 8-22 mins        [name] White, PT Acute Rehabilitation Services [phone] 10/05/2018   [name] White 10/05/2018, 4:42 PM

## 2018-10-05 NOTE — Progress Notes (Signed)
PT Cancellation Note  Patient Details Name: [name] White. MRN: [id number] DOB: 07/29/1966   Cancelled Treatment:    Reason Eval/Treat Not Completed: Fatigue/lethargy limiting ability to participate; patient just back to bed after up and in bathroom, requested PT to return later this pm.  Will attempt again.   Reginia Naas 10/05/2018, 11:44 AM  Magda Kiel, [place] [phone] 10/05/2018

## 2018-10-05 NOTE — Social Work (Signed)
CSW continuing to follow for support with disposition when medically appropriate.  Boen Sterbenz, MSW, LCSWA [place] Clinical Social Work [phone]   

## 2018-10-05 NOTE — Progress Notes (Signed)
Overall doing well.  Patient reports some mild headache overnight.  No wound drainage.  No new lower extremity symptoms.  Neurologically stable.  Wound healing well.  Continue efforts at slow mobilization.  Restart physical therapy and Occupational Therapy.

## 2018-10-06 MED ORDER — MUPIROCIN 2 % EX OINT
TOPICAL_OINTMENT | CUTANEOUS | Status: AC
  Filled 2018-10-06: qty 22

## 2018-10-06 NOTE — Progress Notes (Signed)
Physical Therapy Treatment Patient Details Name: [name] White. MRN: [id number] DOB: 07/23/1966 Today's Date: 10/06/2018    History of Present Illness Pt is a 53 yo male sp L2-3 redo decmpression laminectomy; L5-S1 redo decompression laminectomy with bl decompression foraminotomies, L5-S1 posterior lateral arthrodesis with peduncle screw fixation L5-S2. Pmhx: multiple back surgeries, scoliosis, degenerative disc dx, afib, chronic pain syndrome, HLD, HTN.  Complicated by CSF leak with 8 days of bedrest.     PT Comments    Pt eager to begin therapy today. Pt states he feeling better today. Pt continues to have difficulty with functional mobility secondary to pain and prolonged bed rest after CSF but is progressing well to PT goals. Pt continues to want to go to skilled nursing after discharge from hospital for more rehab. Pt educated on precautions and able to recall all three precautions. Pt able to don brace independently in sitting.   Follow Up Recommendations  SNF;Supervision/Assistance - 24 hour     Equipment Recommendations  None recommended by PT    Recommendations for Other Services       Precautions / Restrictions Precautions Precautions: Back Precaution Comments:  Rt foot drop Required Braces or Orthoses: Spinal Brace Spinal Brace: Lumbar corset;Applied in sitting position    Mobility  Bed Mobility Overal bed mobility: Needs Assistance Bed Mobility: Rolling;Sidelying to Sit Rolling: Supervision Sidelying to sit: Supervision       General bed mobility comments: reliance on rail with increased time and supervision for safety, bed flat  Transfers Overall transfer level: Needs assistance   Transfers: Sit to/from Stand Sit to Stand: Supervision         General transfer comment: from low bed and toilet without cues with good hand placement  Ambulation/Gait Ambulation/Gait assistance: Min guard Gait Distance (Feet): 150 Feet Assistive device: Rolling walker (2  wheeled) Gait Pattern/deviations: Step-through pattern;Decreased stride length;Decreased dorsiflexion - right   Gait velocity interpretation: <1.8 ft/sec, indicate of risk for recurrent falls General Gait Details: slow speed with foot drop, cues for posture   Stairs             Wheelchair Mobility    Modified Rankin (Stroke Patients Only)       Balance Overall balance assessment: Needs assistance Sitting-balance support: Feet supported;No upper extremity supported Sitting balance-Leahy Scale: Good     Standing balance support: No upper extremity supported Standing balance-Leahy Scale: Fair Standing balance comment: standing at sink for washing hands with good stability                            Cognition Arousal/Alertness: Awake/alert Behavior During Therapy: WFL for tasks assessed/performed Overall Cognitive Status: Within Functional Limits for tasks assessed                                        Exercises      General Comments        Pertinent Vitals/Pain Pain Score: 8  Pain Location: back Pain Descriptors / Indicators: Aching;Operative site guarding Pain Intervention(s): Limited activity within patient's tolerance;Premedicated before session;Monitored during session;Repositioned    Home Living                      Prior Function            PT Goals (current goals can now be found in  the care plan section) Progress towards PT goals: Progressing toward goals    Frequency           PT Plan Current plan remains appropriate    Co-evaluation              AM-PAC PT "6 Clicks" Mobility   Outcome Measure  Help needed turning from your back to your side while in a flat bed without using bedrails?: A Little Help needed moving from lying on your back to sitting on the side of a flat bed without using bedrails?: A Little Help needed moving to and from a bed to a chair (including a wheelchair)?: A  Little Help needed standing up from a chair using your arms (e.g., wheelchair or bedside chair)?: A Little Help needed to walk in hospital room?: A Little Help needed climbing 3-5 steps with a railing? : A Little 6 Click Score: 18    End of Session Equipment Utilized During Treatment: Back brace;Gait belt Activity Tolerance: Patient tolerated treatment well Patient left: in chair;with call bell/phone within reach Nurse Communication: Mobility status;Precautions PT Visit Diagnosis: Other abnormalities of gait and mobility (R26.89);Difficulty in walking, not elsewhere classified (R26.2)     Time: 8588-5027 PT Time Calculation (min) (ACUTE ONLY): 28 min  Charges:  $Gait Training: 8-22 mins $Therapeutic Activity: 8-22 mins                     [place], [place] [phone]    [name] White 10/06/2018, 8:22 AM

## 2018-10-06 NOTE — Evaluation (Signed)
Occupational Therapy Evaluation Patient Details Name: [name] White. MRN: [id number] DOB: Mar 08, 1966 Today's Date: 10/06/2018    History of Present Illness Pt is a 53 yo male sp L2-3 redo decmpression laminectomy; L5-S1 redo decompression laminectomy with bl decompression foraminotomies, L5-S1 posterior lateral arthrodesis with peduncle screw fixation L5-S2. Pmhx: multiple back surgeries, scoliosis, degenerative disc dx, afib, chronic pain syndrome, HLD, HTN.  Complicated by CSF leak with 8 days of bedrest.    Clinical Impression   Pt admitted with spinal sx complicated by CSF leak. Pt now presenting with generalized weakness, post-op pain, and deficits in balance/LB ADLs. Pt currently requiring min A for LB ADLs and min guard during functional mobility. Pt would benefit from SNF stay d/t limited support at home. OT will continue to follow acutely.   HR at rest sitting in recliner: 115-120 bpm HR during 50 ft of functional mobility 120-130 bpm    Follow Up Recommendations  SNF    Equipment Recommendations  None recommended by OT    Recommendations for Other Services       Precautions / Restrictions Precautions Precautions: Back Precaution Booklet Issued: Yes (comment) Precaution Comments:  Rt foot drop Required Braces or Orthoses: Spinal Brace Spinal Brace: Lumbar corset;Applied in sitting position Restrictions Weight Bearing Restrictions: No      Mobility Bed Mobility Overal bed mobility: Needs Assistance Bed Mobility: Rolling;Sidelying to Sit Rolling: Supervision Sidelying to sit: Supervision     Sit to sidelying: Min guard General bed mobility comments: Use of bed rails, cueing for log rolling  Transfers Overall transfer level: Needs assistance Equipment used: Rolling walker (2 wheeled) Transfers: Sit to/from Stand Sit to Stand: Supervision Stand pivot transfers: Min guard       General transfer comment: from low bed and toilet without cues with good hand  placement    Balance Overall balance assessment: Needs assistance Sitting-balance support: Feet supported;No upper extremity supported Sitting balance-Leahy Scale: Good Sitting balance - Comments: EOB   Standing balance support: No upper extremity supported Standing balance-Leahy Scale: Fair Standing balance comment: standing at sink for washing hands with good stability                           ADL either performed or assessed with clinical judgement   ADL Overall ADL's : Needs assistance/impaired Eating/Feeding: Set up   Grooming: Oral care;Wash/dry face;Wash/dry hands;Set up;Supervision/safety;Standing   Upper Body Bathing: Supervision/ safety;Sitting   Lower Body Bathing: Sit to/from stand;Cueing for safety;Cueing for back precautions;Cueing for compensatory techniques;Min guard   Upper Body Dressing : Supervision/safety;Cueing for compensatory techniques;Sitting   Lower Body Dressing: Minimal assistance;Sit to/from stand;Cueing for back precautions   Toilet Transfer: Min guard;Cueing for safety;Ambulation;RW;BSC;Grab bars   Toileting- Clothing Manipulation and Hygiene: Min guard;Sit to/from stand;Cueing for back precautions       Functional mobility during ADLs: Min guard;Rolling walker General ADL Comments: Pt able to use figure 4 technique to don LB clothing, reinforced safety during LB dressing by remaining seated     Vision Baseline Vision/History: No visual deficits Patient Visual Report: No change from baseline Vision Assessment?: No apparent visual deficits            Pertinent Vitals/Pain Pain Assessment: 0-10 Pain Score: 8  Pain Location: back Pain Descriptors / Indicators: Aching;Operative site guarding Pain Intervention(s): Monitored during session;RN gave pain meds during session     Hand Dominance Right(Writes with L, "Everything else" with R)   Extremity/Trunk Assessment  Upper Extremity Assessment Upper Extremity Assessment:  Overall WFL for tasks assessed   Lower Extremity Assessment Lower Extremity Assessment: Defer to PT evaluation   Cervical / Trunk Assessment Cervical / Trunk Assessment: Other exceptions Cervical / Trunk Exceptions: significant scoliosis from youth   Communication Communication Communication: No difficulties   Cognition Arousal/Alertness: Awake/alert Behavior During Therapy: WFL for tasks assessed/performed Overall Cognitive Status: Within Functional Limits for tasks assessed                                 General Comments: Verbose but able to be redirected to task              Home Living Family/patient expects to be discharged to:: Private residence Living Arrangements: Parent Available Help at Discharge: Family;Friend(s);Available PRN/intermittently Type of Home: House Home Access: Ramped entrance     Home Layout: One level     Bathroom Shower/Tub: Walk-in shower;Tub/shower unit   Bathroom Toilet: Handicapped height Bathroom Accessibility: Yes How Accessible: Accessible via wheelchair Home Equipment: Bedside commode;Walker - 2 wheels;Tub bench   Additional Comments: 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      Prior Functioning/Environment Level of Independence: Independent with assistive device(s)                 OT Problem List: Decreased strength;Decreased activity tolerance;Impaired balance (sitting and/or standing);Decreased safety awareness;Pain;Increased edema;Impaired UE functional use;Decreased coordination;Decreased knowledge of use of DME or AE;Decreased knowledge of precautions      OT Treatment/Interventions: Self-care/ADL training;Therapeutic exercise;Energy conservation;Therapeutic activities;DME and/or AE instruction;Patient/family education;Balance training    OT Goals(Current goals can be found in the care plan section) Acute Rehab OT Goals Patient Stated Goal: "Go get stronger at rehab" OT Goal Formulation: With patient Time For  Goal Achievement: 10/14/18 Potential to Achieve Goals: Fair  OT Frequency: Min 2X/week   Barriers to D/C: Decreased caregiver support       AM-PAC OT "6 Clicks" Daily Activity     Outcome Measure Help from another person eating meals?: None Help from another person taking care of personal grooming?: A Little Help from another person toileting, which includes using toliet, bedpan, or urinal?: A Little Help from another person bathing (including washing, rinsing, drying)?: A Little Help from another person to put on and taking off regular upper body clothing?: A Little Help from another person to put on and taking off regular lower body clothing?: A Little 6 Click Score: 19   End of Session Equipment Utilized During Treatment: Rolling walker;Back brace;Oxygen Nurse Communication: Mobility status  Activity Tolerance: Patient tolerated treatment well Patient left: in bed;with call bell/phone within reach;with nursing/sitter in room  OT Visit Diagnosis: Unsteadiness on feet (R26.81);Muscle weakness (generalized) (M62.81)                Time: 3893-7342 OT Time Calculation (min): 23 min Charges:  OT General Charges $OT Visit: 1 Visit OT Evaluation $OT Re-eval: 1 Re-eval OT Treatments $Self Care/Home Management : 8-22 mins  Curtis Sites OTR/L  10/06/2018, 9:35 AM

## 2018-10-06 NOTE — Progress Notes (Signed)
Overall stable.  No significant headache.  Still with incisional back pain which is limiting.  No lower extremity radicular symptoms.  Wound clean and dry.  Chest and abdomen benign.  Overall healing well.  No evidence of ongoing significant CSF leak.  Okay to mobilize further.  Okay for discharge to skilled nursing facility when bed available.

## 2018-10-07 DIAGNOSIS — M5489 Other dorsalgia: Secondary | ICD-10-CM | POA: Diagnosis not present

## 2018-10-07 DIAGNOSIS — R52 Pain, unspecified: Secondary | ICD-10-CM | POA: Diagnosis not present

## 2018-10-07 DIAGNOSIS — Z87891 Personal history of nicotine dependence: Secondary | ICD-10-CM | POA: Diagnosis not present

## 2018-10-07 DIAGNOSIS — I4891 Unspecified atrial fibrillation: Secondary | ICD-10-CM | POA: Diagnosis not present

## 2018-10-07 DIAGNOSIS — R279 Unspecified lack of coordination: Secondary | ICD-10-CM | POA: Diagnosis not present

## 2018-10-07 DIAGNOSIS — E785 Hyperlipidemia, unspecified: Secondary | ICD-10-CM | POA: Diagnosis not present

## 2018-10-07 DIAGNOSIS — Z743 Need for continuous supervision: Secondary | ICD-10-CM | POA: Diagnosis not present

## 2018-10-07 DIAGNOSIS — M419 Scoliosis, unspecified: Secondary | ICD-10-CM | POA: Diagnosis not present

## 2018-10-07 DIAGNOSIS — M1611 Unilateral primary osteoarthritis, right hip: Secondary | ICD-10-CM | POA: Diagnosis not present

## 2018-10-07 DIAGNOSIS — I10 Essential (primary) hypertension: Secondary | ICD-10-CM | POA: Diagnosis not present

## 2018-10-07 DIAGNOSIS — G894 Chronic pain syndrome: Secondary | ICD-10-CM | POA: Diagnosis not present

## 2018-10-07 DIAGNOSIS — M6281 Muscle weakness (generalized): Secondary | ICD-10-CM | POA: Diagnosis not present

## 2018-10-07 DIAGNOSIS — M4310 Spondylolisthesis, site unspecified: Secondary | ICD-10-CM | POA: Diagnosis not present

## 2018-10-07 DIAGNOSIS — R2689 Other abnormalities of gait and mobility: Secondary | ICD-10-CM | POA: Diagnosis not present

## 2018-10-07 DIAGNOSIS — Z4789 Encounter for other orthopedic aftercare: Secondary | ICD-10-CM | POA: Diagnosis not present

## 2018-10-07 MED ORDER — OXYCODONE HCL 10 MG PO TABS: 10.0000 mg | ORAL_TABLET | ORAL | 0 refills | Status: DC | PRN

## 2018-10-07 MED ORDER — ACETAZOLAMIDE ER 500 MG PO CP12: 500.0000 mg | ORAL_CAPSULE | Freq: Two times a day (BID) | ORAL | 0 refills | Status: DC

## 2018-10-07 MED ORDER — BUTALBITAL-APAP-CAFFEINE 50-325-40 MG PO TABS: 1.0000 | ORAL_TABLET | Freq: Four times a day (QID) | ORAL | 0 refills | Status: DC | PRN

## 2018-10-07 MED ORDER — MENTHOL (TOPICAL ANALGESIC) 4 % EX GEL: 1.0000 "application " | Freq: Four times a day (QID) | CUTANEOUS | 1 refills | Status: DC | PRN

## 2018-10-07 MED ORDER — HYDROCODONE-ACETAMINOPHEN 10-325 MG PO TABS: 1.0000 | ORAL_TABLET | ORAL | 0 refills | Status: DC | PRN

## 2018-10-07 MED ORDER — DIAZEPAM 5 MG PO TABS: 5.0000 mg | ORAL_TABLET | Freq: Four times a day (QID) | ORAL | 0 refills | Status: DC | PRN

## 2018-10-07 NOTE — Social Work (Addendum)
Clinical Social Worker facilitated patient discharge including contacting patient family and facility to confirm patient discharge plans.  Clinical information faxed to facility and family agreeable with plan.  CSW arranged ambulance transport via [facility] to [facility] at 1:00pm RN to call [phone]  with report prior to discharge.  Clinical Social Worker will sign off for now as social work intervention is no longer needed. Please consult [place] again if new need arises.  Westley Hummer, MSW, [place] Social Worker [phone]

## 2018-10-07 NOTE — Progress Notes (Signed)
Physical Therapy Treatment Patient Details Name: [name] White. MRN: [id number] DOB: [birth year] Today's Date: 10/07/2018    History of Present Illness Pt is a 53 yo male sp L2-3 redo decmpression laminectomy; L5-S1 redo decompression laminectomy with bl decompression foraminotomies, L5-S1 posterior lateral arthrodesis with peduncle screw fixation L5-S2. Pmhx: multiple back surgeries, scoliosis, degenerative disc dx, afib, chronic pain syndrome, HLD, HTN.  Complicated by CSF leak with 8 days of bedrest.     PT Comments    Pt was eager to participate with therapy. Pt is continuing to progress well towards PT goals. Pt will benefit from continued skilled therapy to improve independence with mobility and progress gait and balance training.   Follow Up Recommendations  SNF;Supervision/Assistance - 24 hour     Equipment Recommendations  None recommended by PT    Recommendations for Other Services       Precautions / Restrictions Precautions Precautions: Back Precaution Comments:  Rt foot drop Required Braces or Orthoses: Spinal Brace Spinal Brace: Lumbar corset;Applied in sitting position    Mobility  Bed Mobility Overal bed mobility: Needs Assistance Bed Mobility: Rolling;Sidelying to Sit Rolling: Supervision Sidelying to sit: Supervision       General bed mobility comments: use of rail with bed flat, increased time  Transfers Overall transfer level: Needs assistance   Transfers: Sit to/from Stand Sit to Stand: Supervision         General transfer comment: from low bed and toilet without cues with good hand placement  Ambulation/Gait Ambulation/Gait assistance: Min guard Gait Distance (Feet): 200 Feet Assistive device: Rolling walker (2 wheeled) Gait Pattern/deviations: Step-through pattern;Decreased stride length;Decreased dorsiflexion - right   Gait velocity interpretation: >2.62 ft/sec, indicative of community ambulatory General Gait Details: slow speed with  foot drop, cues for posture, pt with improved trunk extension this session and able to maintain proper position in RW   Stairs             Wheelchair Mobility    Modified Rankin (Stroke Patients Only)       Balance Overall balance assessment: Mild deficits observed, not formally tested                                          Cognition Arousal/Alertness: Awake/alert Behavior During Therapy: WFL for tasks assessed/performed Overall Cognitive Status: Within Functional Limits for tasks assessed                                        Exercises      General Comments        Pertinent Vitals/Pain Pain Score: 8  Pain Location: back Pain Descriptors / Indicators: Aching;Operative site guarding Pain Intervention(s): Limited activity within patient's tolerance;Premedicated before session;Monitored during session;Repositioned    Home Living                      Prior Function            PT Goals (current goals can now be found in the care plan section) Progress towards PT goals: Progressing toward goals    Frequency    Min 3X/week      PT Plan Current plan remains appropriate    Co-evaluation  AM-PAC PT "6 Clicks" Mobility   Outcome Measure  Help needed turning from your back to your side while in a flat bed without using bedrails?: A Little Help needed moving from lying on your back to sitting on the side of a flat bed without using bedrails?: A Little Help needed moving to and from a bed to a chair (including a wheelchair)?: A Little Help needed standing up from a chair using your arms (e.g., wheelchair or bedside chair)?: A Little Help needed to walk in hospital room?: A Little Help needed climbing 3-5 steps with a railing? : A Little 6 Click Score: 18    End of Session Equipment Utilized During Treatment: Back brace;Gait belt Activity Tolerance: Patient tolerated treatment well Patient  left: in chair;with call bell/phone within reach Nurse Communication: Mobility status;Precautions PT Visit Diagnosis: Other abnormalities of gait and mobility (R26.89);Difficulty in walking, not elsewhere classified (R26.2)     Time: 1751-0258 PT Time Calculation (min) (ACUTE ONLY): 20 min  Charges:  $Gait Training: 8-22 mins                     [place], [place] [phone]    [name] White 10/07/2018, 8:11 AM

## 2018-10-07 NOTE — Clinical Social Work Placement (Signed)
   CLINICAL SOCIAL WORK PLACEMENT  NOTE [facility] RN to call report to [phone]  Date:  10/07/2018  Patient Details  Name: [name] White. MRN: [id number] Date of Birth: March 02, 1966  Clinical Social Work is seeking post-discharge placement for this patient at the [place] level of care (*CSW will initial, date and re-position this form in  chart as items are completed):  Yes   Patient/family provided with [place] Work Department's list of facilities offering this level of care within the geographic area requested by the patient (or if unable, by the patient's family).  Yes   Patient/family informed of their freedom to choose among providers that offer the needed level of care, that participate in Medicare, Medicaid or managed care program needed by the patient, have an available bed and are willing to accept the patient.  Yes   Patient/family informed of 's ownership interest in [facility] and [facility], as well as of the fact that they are under no obligation to receive care at these facilities.  PASRR submitted to EDS on       PASRR number received on 09/22/18     Existing PASRR number confirmed on       FL2 transmitted to all facilities in geographic area requested by pt/family on 09/22/18     FL2 transmitted to all facilities within larger geographic area on       Patient informed that his/her managed care company has contracts with or will negotiate with certain facilities, including the following:        Yes   Patient/family informed of bed offers received.  Patient chooses bed at [facility]     Physician recommends and patient chooses bed at      Patient to be transferred to [facility] on 10/07/18.  Patient to be transferred to facility by [facility]     Patient family notified on 10/07/18 of transfer.  Name of family member notified:  cousin Alisa via telephone      PHYSICIAN Please prepare prescriptions     Additional Comment:    _______________________________________________ Alexander Mt, [place] 10/07/2018, 9:56 AM

## 2018-10-07 NOTE — Discharge Summary (Addendum)
Physician Discharge Summary  Patient ID: [name] White. MRN: [id number] DOB/AGE: 1965/11/15 53 y.o.  Admit date: 09/20/2018 Discharge date: 10/07/2018  Admission Diagnoses: Degenerative spondylolisthesis  Discharge Diagnoses:  Active Problems:   Status post spinal surgery   Degenerative spondylolisthesis   Discharged Condition: good  Hospital Course: [name] White underwent an L5-S1 redo decompressive laminectomy with bilateral L5 and S1 redo decompressive foraminotomies,L2-3 redo decompressive laminectomy,L5-S1 posterior lateral arthrodesis utilizing segmental pedicle screw fixation from L3-S2withMazor guided S1 pedicle screw, S2 alar iliac wing screw placement bilaterally on 09/20/2018. He developed a CSF leak and subsequently underwent a reexploration of his lumbar wound, with placement of lumbar subarachnoid drain. The drain was removed on 10/04/2018. [name] White has done well since drain removal, with no signs of further CSF leak. He is ready for discharge to a skilled nursing facility to continue his rehabilitation efforts.  Consults: rehabilitation medicine   Treatments: surgery: L5-S1 redo decompressive laminectomy with bilateral L5 and S1 redo decompressive foraminotomies,L2-3 redo decompressive laminectomy,L5-S1 posterior lateral arthrodesis utilizing segmental pedicle screw fixation from L3-S2withMazor guided S1 pedicle screw, S2 alar iliac wing screw placement bilaterally   Discharge Exam: Blood pressure (!) 138/95, pulse (!) 107, temperature 98.5 F (36.9 C), temperature source Oral, resp. rate 16, height 5' 7.01" (1.702 m), weight 90.3 kg, SpO2 96 %.   Alert and oriented x 4 MAE Strength 5/5 BUE, BLE PERRLA Abdomen soft, less distended today Wounds clean, dry, and intact; Closed with sutures  Disposition: Discharge disposition: 03-Skilled Nursing Facility        Allergies as of 10/07/2018   No Known Allergies     Medication List    STOP taking these medications    loperamide 2 MG tablet Commonly known as:  IMODIUM A-D     TAKE these medications   acetaminophen 325 MG tablet Commonly known as:  TYLENOL Take 650 mg by mouth every 6 (six) hours as needed for moderate pain.   butalbital-acetaminophen-caffeine 50-325-40 MG tablet Commonly known as:  FIORICET, ESGIC Take 1 tablet by mouth every 6 (six) hours as needed for headache.   diazepam 5 MG tablet Commonly known as:  VALIUM Take 1-2 tablets (5-10 mg total) by mouth every 6 (six) hours as needed for muscle spasms.   diphenhydrAMINE 25 MG tablet Commonly known as:  BENADRYL Take 25 mg by mouth at bedtime as needed for allergies.   esomeprazole 40 MG packet Commonly known as:  NEXIUM Take 40 mg by mouth daily before breakfast.   HYDROcodone-acetaminophen 10-325 MG tablet Commonly known as:  NORCO Take 1 tablet by mouth every 4 (four) hours as needed for moderate pain ((score 4 to 6)).   Menthol (Topical Analgesic) 4 % Gel Commonly known as:  BIOFREEZE Apply 1 application topically every 6 (six) hours as needed.   multivitamin with minerals Tabs tablet Take 1 tablet by mouth daily. Centrum Silver   Oxycodone HCl 10 MG Tabs Take 1 tablet (10 mg total) by mouth every 4 (four) hours as needed ((score 7 to 10)). What changed:    when to take this  reasons to take this            Durable Medical Equipment  (From admission, onward)         Start     Ordered   09/20/18 1818  DME Walker rolling  Once    Question:  Patient needs a walker to treat with the following condition  Answer:  Degenerative spondylolisthesis   09/20/18 1817  09/20/18 1818  DME 3 n 1  Once     09/20/18 1817          Contact information for follow-up providers    Earnie Larsson, MD. Go on 10/19/2018.   Specialty:  Neurosurgery Why:  Appointment scheduled for 10/19/2018 at 11:15 am Contact information: [address] [place] [address] [phone]            Contact  information for after-discharge care    Destination    [facility] .   Service:  Skilled Nursing Contact information: [address] [place] [phone]                  Signed: Patricia Nettle 10/07/2018, 8:44 AM

## 2018-10-07 NOTE — Care Management Note (Signed)
Case Management Note  Patient Details  Name: Margie Brink. MRN: [id number] Date of Birth: 1966-02-03  Subjective/Objective:  Pt is a 53 yo male sp L2-3 redo decmpression laminectomy; L5-S1 redo decompression laminectomy with bl decompression foraminotomies, L5-S1 posterior lateral arthrodesis with peduncle screw fixation L5-S2.  PTA, pt independent with assistive devices; lives with and cares for father at home.                  Action/Plan: PT/OT recommending SNF.  Pt has a bed at [facility], per CSW arrangements, upon medical stability.   Expected Discharge Date:  10/07/18               Expected Discharge Plan:  Skilled Nursing Facility  In-House Referral:  Clinical Social Work  Discharge planning Services  CM Consult  Post Acute Care Choice:    Choice offered to:     DME Arranged:    DME Agency:     HH Arranged:    [place] Agency:   Status of Service:  Completed, signed off  If discussed at [employer], dates discussed:    Additional Comments:  10/07/2018 J. Kenlyn Lose, RN, BSN Pt medically stable for discharge today.  Plan dc to SNF, per CSW arrangements.   Reinaldo Raddle, RN, BSN  Trauma/Neuro ICU Case Manager [phone]

## 2018-10-07 NOTE — Progress Notes (Signed)
Report given to nurse at [facility].

## 2018-10-07 NOTE — Progress Notes (Signed)
Discharge instructions given to pt. Pt verbalized understanding. Education provided.

## 2018-10-15 DIAGNOSIS — I429 Cardiomyopathy, unspecified: Secondary | ICD-10-CM | POA: Diagnosis not present

## 2018-10-15 DIAGNOSIS — F17210 Nicotine dependence, cigarettes, uncomplicated: Secondary | ICD-10-CM | POA: Diagnosis not present

## 2018-10-15 DIAGNOSIS — I10 Essential (primary) hypertension: Secondary | ICD-10-CM | POA: Diagnosis not present

## 2018-10-15 DIAGNOSIS — M5136 Other intervertebral disc degeneration, lumbar region: Secondary | ICD-10-CM | POA: Diagnosis not present

## 2018-10-15 DIAGNOSIS — M48061 Spinal stenosis, lumbar region without neurogenic claudication: Secondary | ICD-10-CM | POA: Diagnosis not present

## 2018-10-15 DIAGNOSIS — I4891 Unspecified atrial fibrillation: Secondary | ICD-10-CM | POA: Diagnosis not present

## 2018-10-15 DIAGNOSIS — G9741 Accidental puncture or laceration of dura during a procedure: Secondary | ICD-10-CM | POA: Diagnosis not present

## 2018-10-15 DIAGNOSIS — Z4789 Encounter for other orthopedic aftercare: Secondary | ICD-10-CM | POA: Diagnosis not present

## 2018-10-20 DIAGNOSIS — M5136 Other intervertebral disc degeneration, lumbar region: Secondary | ICD-10-CM | POA: Diagnosis not present

## 2018-10-20 DIAGNOSIS — I10 Essential (primary) hypertension: Secondary | ICD-10-CM | POA: Diagnosis not present

## 2018-10-20 DIAGNOSIS — I4891 Unspecified atrial fibrillation: Secondary | ICD-10-CM | POA: Diagnosis not present

## 2018-10-20 DIAGNOSIS — Z4789 Encounter for other orthopedic aftercare: Secondary | ICD-10-CM | POA: Diagnosis not present

## 2018-10-20 DIAGNOSIS — G9741 Accidental puncture or laceration of dura during a procedure: Secondary | ICD-10-CM | POA: Diagnosis not present

## 2018-10-20 DIAGNOSIS — M48061 Spinal stenosis, lumbar region without neurogenic claudication: Secondary | ICD-10-CM | POA: Diagnosis not present

## 2018-10-21 DIAGNOSIS — I10 Essential (primary) hypertension: Secondary | ICD-10-CM | POA: Diagnosis not present

## 2018-10-21 DIAGNOSIS — M48061 Spinal stenosis, lumbar region without neurogenic claudication: Secondary | ICD-10-CM | POA: Diagnosis not present

## 2018-10-21 DIAGNOSIS — G9741 Accidental puncture or laceration of dura during a procedure: Secondary | ICD-10-CM | POA: Diagnosis not present

## 2018-10-21 DIAGNOSIS — I4891 Unspecified atrial fibrillation: Secondary | ICD-10-CM | POA: Diagnosis not present

## 2018-10-21 DIAGNOSIS — M5136 Other intervertebral disc degeneration, lumbar region: Secondary | ICD-10-CM | POA: Diagnosis not present

## 2018-10-21 DIAGNOSIS — Z4789 Encounter for other orthopedic aftercare: Secondary | ICD-10-CM | POA: Diagnosis not present

## 2018-10-22 IMAGING — RF DG HIP (WITH PELVIS) OPERATIVE*R*
1 series · 2 of 2 positions shown · non-contrast
Comparison: None.

CLINICAL DATA: Right anterior hip arthroplasty.

EXAM:
OPERATIVE right HIP (WITH PELVIS IF PERFORMED) 2 VIEWS
TECHNIQUE: Fluoroscopic spot image(s) were submitted for interpretation
post-operatively.
FLUOROSCOPY TIME:  17 seconds.

[Series 1: run · 2 of 2 slices shown]
[im 1/2]
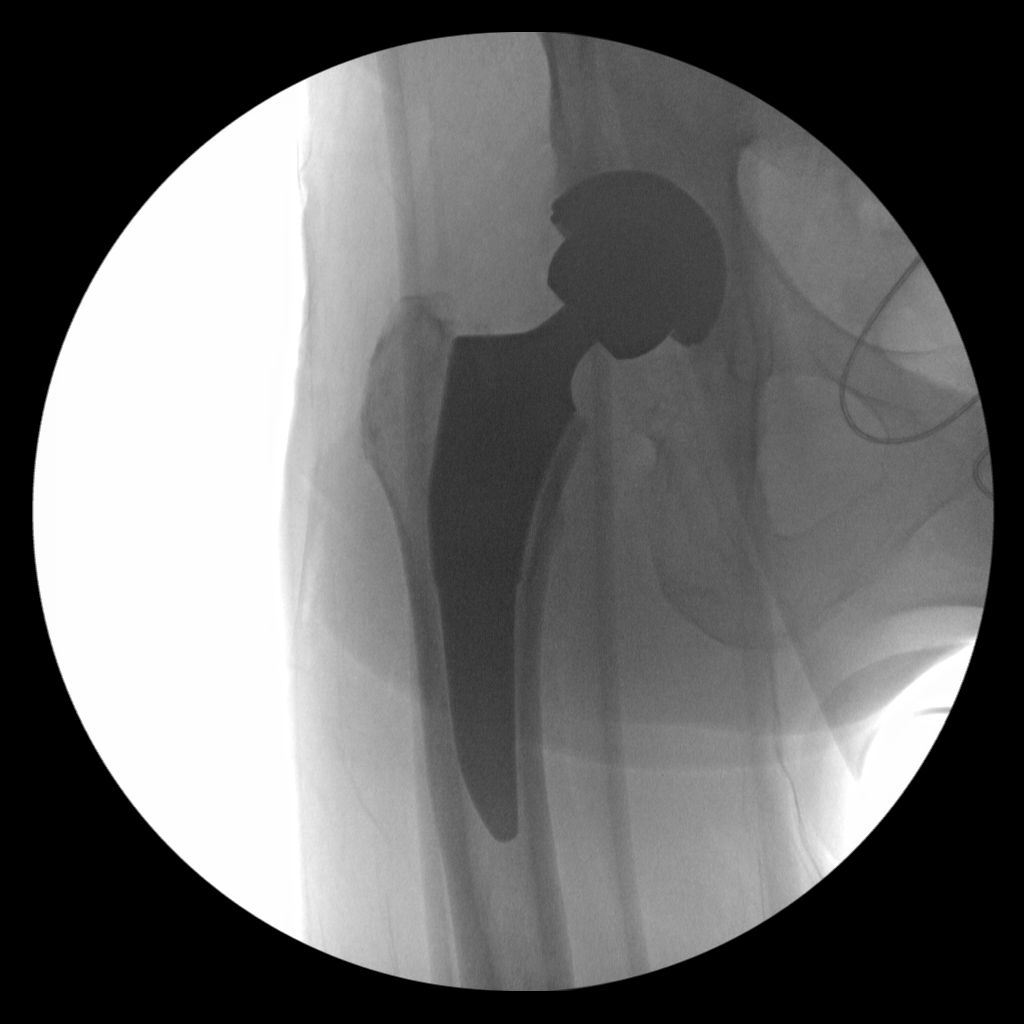
[im 2/2]
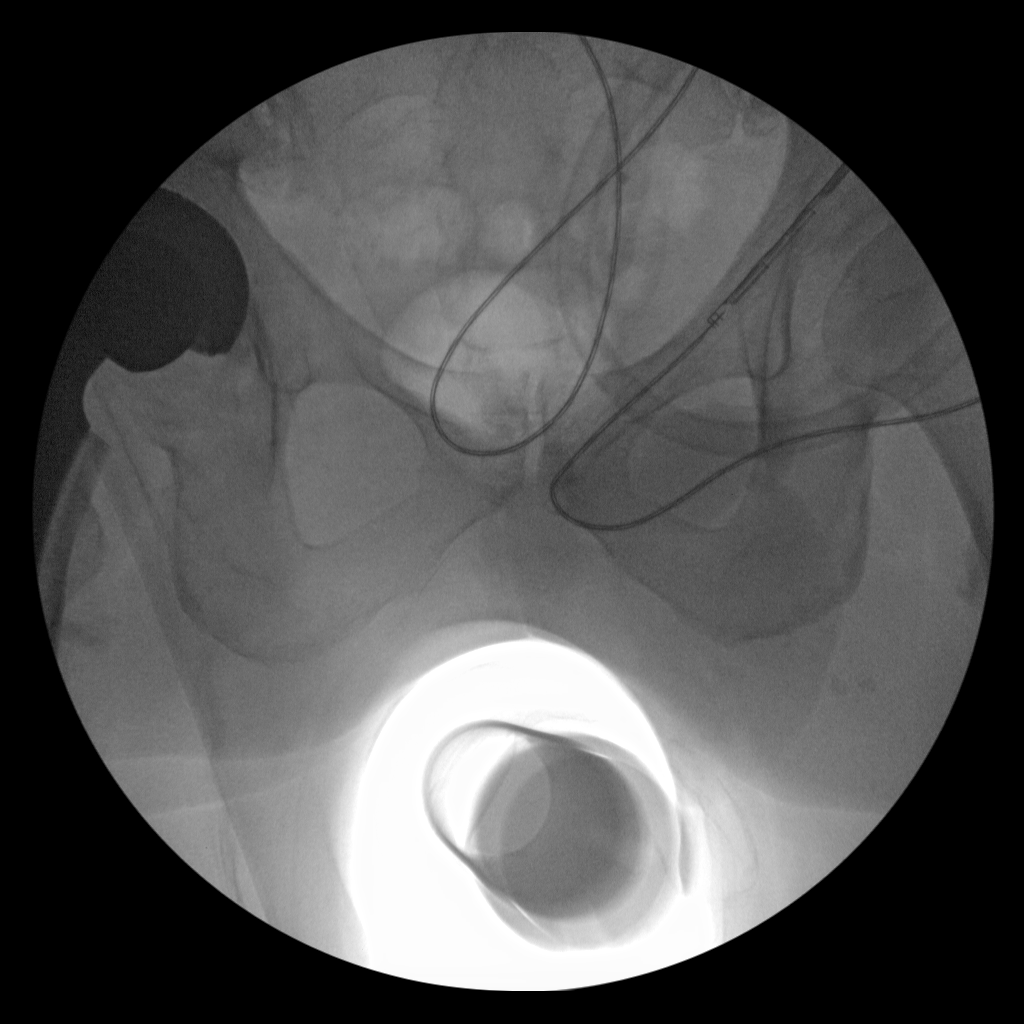

[2 of 2 positions shown; findings below may reference images not displayed]

FINDINGS: Status post right total hip arthroplasty. The acetabular and femoral
components appear to be well situated. No fracture or dislocation is
noted.
IMPRESSION: Status post right hip arthroplasty.

## 2018-10-25 DIAGNOSIS — G9741 Accidental puncture or laceration of dura during a procedure: Secondary | ICD-10-CM | POA: Diagnosis not present

## 2018-10-25 DIAGNOSIS — Z4789 Encounter for other orthopedic aftercare: Secondary | ICD-10-CM | POA: Diagnosis not present

## 2018-10-25 DIAGNOSIS — I10 Essential (primary) hypertension: Secondary | ICD-10-CM | POA: Diagnosis not present

## 2018-10-25 DIAGNOSIS — M5136 Other intervertebral disc degeneration, lumbar region: Secondary | ICD-10-CM | POA: Diagnosis not present

## 2018-10-25 DIAGNOSIS — I4891 Unspecified atrial fibrillation: Secondary | ICD-10-CM | POA: Diagnosis not present

## 2018-10-25 DIAGNOSIS — M48061 Spinal stenosis, lumbar region without neurogenic claudication: Secondary | ICD-10-CM | POA: Diagnosis not present

## 2018-11-08 DIAGNOSIS — I10 Essential (primary) hypertension: Secondary | ICD-10-CM | POA: Diagnosis not present

## 2018-11-08 DIAGNOSIS — G9741 Accidental puncture or laceration of dura during a procedure: Secondary | ICD-10-CM | POA: Diagnosis not present

## 2018-11-08 DIAGNOSIS — M5136 Other intervertebral disc degeneration, lumbar region: Secondary | ICD-10-CM | POA: Diagnosis not present

## 2018-11-08 DIAGNOSIS — M48061 Spinal stenosis, lumbar region without neurogenic claudication: Secondary | ICD-10-CM | POA: Diagnosis not present

## 2018-11-08 DIAGNOSIS — I4891 Unspecified atrial fibrillation: Secondary | ICD-10-CM | POA: Diagnosis not present

## 2018-11-08 DIAGNOSIS — Z4789 Encounter for other orthopedic aftercare: Secondary | ICD-10-CM | POA: Diagnosis not present

## 2018-12-01 ENCOUNTER — Encounter: Payer: 59 | Admitting: Family Medicine

## 2019-01-11 DIAGNOSIS — M5137 Other intervertebral disc degeneration, lumbosacral region: Secondary | ICD-10-CM | POA: Diagnosis not present

## 2019-01-11 DIAGNOSIS — I10 Essential (primary) hypertension: Secondary | ICD-10-CM | POA: Diagnosis not present

## 2019-01-11 DIAGNOSIS — Z6830 Body mass index (BMI) 30.0-30.9, adult: Secondary | ICD-10-CM | POA: Diagnosis not present

## 2019-01-11 DIAGNOSIS — M5416 Radiculopathy, lumbar region: Secondary | ICD-10-CM | POA: Diagnosis not present

## 2019-02-16 ENCOUNTER — Encounter: Payer: 59 | Admitting: Family Medicine

## 2019-03-28 ENCOUNTER — Telehealth (INDEPENDENT_AMBULATORY_CARE_PROVIDER_SITE_OTHER): Payer: Medicare Other | Admitting: Family Medicine

## 2019-03-28 ENCOUNTER — Encounter: Payer: Self-pay | Admitting: Family Medicine

## 2019-03-28 ENCOUNTER — Other Ambulatory Visit: Payer: Self-pay

## 2019-03-28 VITALS — Wt 205.0 lb

## 2019-03-28 DIAGNOSIS — J0100 Acute maxillary sinusitis, unspecified: Secondary | ICD-10-CM

## 2019-03-28 MED ORDER — AMOXICILLIN-POT CLAVULANATE 875-125 MG PO TABS: 1.0000 | ORAL_TABLET | Freq: Two times a day (BID) | ORAL | 0 refills | Status: DC

## 2019-03-28 NOTE — Progress Notes (Signed)
Virtual Visit via Video Note  I connected with [name] White  on 03/28/19 at 11:20 AM EDT by a video enabled telemedicine application and verified that I am speaking with the correct person using two identifiers.  Location patient: home Location provider:work or home office Persons participating in the virtual visit: patient, provider  I discussed the limitations of evaluation and management by telemedicine and the availability of in person appointments. The patient expressed understanding and agreed to proceed.   HPI:  Acute visit for sinus congestion: -started 2 weeks ago, worsening the few days -symptoms include: sneezing, lots of sinus congestion, pain and pressure L maxillary, green mucus from the nose, drainage -denies fever, SOB, rash, cough -he has been isolated completely due to the pandemic, no sick contacts -had a sinus infection back in 2011 - this feels the same -denies any abx allergies  ROS: See pertinent positives and negatives per HPI.  Past Medical History:  Diagnosis Date  . AF (atrial fibrillation) ([place]) 2009   nonsustained  . Arthritis   . Blood transfusion without reported diagnosis    1960's/ as baby  . Cardiomyopathy secondary 2009   had ablation  . Chronic pain syndrome    back pain  . Dysrhythmia    states no longer a problem  . Heart murmur    states no longer there  . History of kidney stones   . Hyperlipidemia   . Hypertension   . Inguinal hernia    left  . Nephrolithiasis    2 kidney stones  . PSVT (paroxysmal supraventricular tachycardia) ([place])    had ablation  . Scoliosis    severe/ has a lot metal in back per pt/     Past Surgical History:  Procedure Laterality Date  . ABDOMINAL EXPOSURE N/A 09/20/2018   Procedure: ABDOMINAL EXPOSURE;  Surgeon: Angelia Mould, MD;  Location: [facility] OR;  Service: Vascular;  Laterality: N/A;  . ANTERIOR LUMBAR FUSION N/A 09/20/2018   Procedure: Lumbar 5 Sacral 1 Anterior lumbar interbody fusion with Lumbar  3 to Iliac fusion with pedicle screws/Lumbar 2-3 Decompressive laminectomy;  Surgeon: Earnie Larsson, MD;  Location: [place];  Service: Neurosurgery;  Laterality: N/A;  Lumbar 5 Sacral 1 Anterior lumbar interbody fusion with Lumbar 3 to Iliac fusion with pedicle screws/Lumbar 2-3 Decompressive laminectomy  . APPLICATION OF ROBOTIC ASSISTANCE FOR SPINAL PROCEDURE N/A 09/20/2018   Procedure: APPLICATION OF ROBOTIC ASSISTANCE FOR SPINAL PROCEDURE;  Surgeon: Earnie Larsson, MD;  Location: [place];  Service: Neurosurgery;  Laterality: N/A;  . AV NODE ABLATION     09/16/07. SVT Dr. Caryl Comes  . BACK SURGERY     01/12/00 and 1982/ has had 6 surgeries on back  . COLONOSCOPY    . INGUINAL HERNIA REPAIR     Left  . LUMBAR WOUND DEBRIDEMENT N/A 09/26/2018   Procedure: LUMBAR WOUND DEBRIDEMENT Placement of lumbar drain;  Surgeon: Earnie Larsson, MD;  Location: [place];  Service: Neurosurgery;  Laterality: N/A;  LUMBAR WOUND DEBRIDEMENT Placement of lumbar drain  . POSTERIOR LUMBAR FUSION 4 LEVEL N/A 09/20/2018   Procedure: POSTERIOR LUMBAR FUSION 4 LEVEL;  Surgeon: Earnie Larsson, MD;  Location: [place];  Service: Neurosurgery;  Laterality: N/A;  . REPLACEMENT TOTAL KNEE     1999, L. Murphy/wainer.   Marland Kitchen TOTAL HIP ARTHROPLASTY Right 12/07/2016   Procedure: TOTAL HIP ARTHROPLASTY ANTERIOR APPROACH;  Surgeon: Frederik Pear, MD;  Location: [place];  Service: Orthopedics;  Laterality: Right;    Family History  Problem Relation Age  of Onset  . Breast cancer Mother   . Diabetes Father   . Heart attack Father        late 51s  . Atrial fibrillation Father   . Liver disease Paternal Uncle     SOCIAL HX: see hpi   Current Outpatient Medications:  .  acetaminophen (TYLENOL) 325 MG tablet, Take 650 mg by mouth every 6 (six) hours as needed for moderate pain. , Disp: , Rfl:  .  diphenhydrAMINE (BENADRYL) 25 MG tablet, Take 25 mg by mouth at bedtime as needed for allergies. , Disp: , Rfl:  .  esomeprazole (NEXIUM) 40 MG packet, Take 40 mg by  mouth daily before breakfast., Disp: , Rfl:  .  Multiple Vitamin (MULTIVITAMIN WITH MINERALS) TABS tablet, Take 1 tablet by mouth daily. Centrum Silver, Disp: , Rfl:  .  amoxicillin-clavulanate (AUGMENTIN) 875-125 MG tablet, Take 1 tablet by mouth 2 (two) times daily., Disp: 20 tablet, Rfl: 0  EXAM:  VITALS per patient if applicable: denies temp  GENERAL: alert, oriented, appears well and in no acute distress  HEENT: atraumatic, conjunttiva clear, no obvious abnormalities on inspection of external nose and ears  NECK: normal movements of the head and neck  LUNGS: on inspection no signs of respiratory distress, breathing rate appears normal, no obvious gross SOB, gasping or wheezing  CV: no obvious cyanosis  MS: moves all visible extremities without noticeable abnormality  PSYCH/NEURO: pleasant and cooperative, no obvious depression or anxiety, speech and thought processing grossly intact  ASSESSMENT AND PLAN:  Discussed the following assessment and plan:  Acute non-recurrent maxillary sinusitis   -we discussed possible serious and likely etiologies, workup and treatment, treatment risks and return precautions. Suspect sinusitis. -after this discussion, [name] White opted for Augmentin 875mg  bid x 7-10 days, nasal saline, probiotic. -follow up advised  if symptoms worsen or persist or new concerns arise.   Lucretia Kern, DO   Patient Instructions  -start the antibiotic (Augmentin) and take as instructed.  -nasal saline twice daily  -probiotic (Align or Culturelle are good options) 4 hours away from antibiotic dose and continue for 2 weeks after the antibiotic.  I hope you are feeling better soon! Seek care promptly if your symptoms worsen, new concerns arise or you are not improving with treatment.

## 2019-03-28 NOTE — Patient Instructions (Signed)
-  start the antibiotic (Augmentin) and take as instructed.  -nasal saline twice daily  -probiotic (Align or Culturelle are good options) 4 hours away from antibiotic dose and continue for 2 weeks after the antibiotic.  I hope you are feeling better soon! Seek care promptly if your symptoms worsen, new concerns arise or you are not improving with treatment.

## 2019-03-31 ENCOUNTER — Ambulatory Visit: Payer: Self-pay | Admitting: *Deleted

## 2019-03-31 ENCOUNTER — Ambulatory Visit (INDEPENDENT_AMBULATORY_CARE_PROVIDER_SITE_OTHER): Payer: Medicare Other | Admitting: Internal Medicine

## 2019-03-31 ENCOUNTER — Other Ambulatory Visit: Payer: Self-pay

## 2019-03-31 DIAGNOSIS — J0100 Acute maxillary sinusitis, unspecified: Secondary | ICD-10-CM | POA: Diagnosis not present

## 2019-03-31 NOTE — Telephone Encounter (Signed)
   Reason for Disposition . [1] Taking antibiotic > 72 hours (3 days) AND [2] sinus pain not improved  Answer Assessment - Initial Assessment Questions 1. ANTIBIOTIC: "What antibiotic are you receiving?" "How many times per day?"     Augmentin, twice daily 2. ONSET: "When was the antibiotic started?" 03/28/2019      3. PAIN: "How bad is the sinus pain?"   (Scale 1-10; mild, moderate or severe)   - MILD (1-3): doesn't interfere with normal activities    - MODERATE (4-7): interferes with normal activities (e.g., work or school) or awakens from sleep   - SEVERE (8-10): excruciating pain and patient unable to do any normal activities        10 4. FEVER: "Do you have a fever?" If so, ask: "What is it, how was it measured, and when did it start?"      No fever  5. SYMPTOMS: "Are there any other symptoms you're concerned about?" If so, ask: "When did it start?" Not sleeping well due to sinus pressure      6. PREGNANCY: "Is there any chance you are pregnant?" "When was your last menstrual period?"     N/A  Protocols used: SINUS INFECTION ON ANTIBIOTIC FOLLOW-UP CALL-A-AH  Spoke with patient.  His sinus symptoms are worsening, has increased sinus pressure and drainage.  Rates sinus pressure as 10/10, he is unable to sleep.  Denies fever.  He is requesting a different antibiotic.   Spoke with flow coordinator - [place].  She states she will find an appointment for a telemedicine visit for patient today and call him back with the appointment information.  Patient understands and agrees with plan.

## 2019-03-31 NOTE — Progress Notes (Signed)
Virtual Visit via Video Note  I connected with [name] White. on 03/31/19 at  3:30 PM EDT by a video enabled telemedicine application and verified that I am speaking with the correct person using two identifiers.  Location patient: home Location provider: work office Persons participating in the virtual visit: patient, provider  I discussed the limitations of evaluation and management by telemedicine and the availability of in person appointments. The patient expressed understanding and agreed to proceed.   HPI: He has scheduled this acute visit to discuss continued sinus pain. He saw Dr. Maudie Mercury earlier this week and was diagnosed with maxillary sinusitis and prescribed Augmentin BID. He has been taking it for 3 days without much change in symptoms. Significant jaw, tooth and eye pain on the left side. No fevers.   ROS: Constitutional: Denies fever, chills, diaphoresis, appetite change and fatigue.  HEENT: Denies photophobia,redness, hearing loss, ear pain, congestion,  sneezing, mouth sores, trouble swallowing, neck pain, neck stiffness and tinnitus.   Respiratory: Denies SOB, DOE, cough, chest tightness,  and wheezing.   Cardiovascular: Denies chest pain, palpitations and leg swelling.  Gastrointestinal: Denies nausea, vomiting, abdominal pain, diarrhea, constipation, blood in stool and abdominal distention.  Genitourinary: Denies dysuria, urgency, frequency, hematuria, flank pain and difficulty urinating.  Endocrine: Denies: hot or cold intolerance, sweats, changes in hair or nails, polyuria, polydipsia. Musculoskeletal: Denies myalgias, back pain, joint swelling, arthralgias and gait problem.  Skin: Denies pallor, rash and wound.  Neurological: Denies dizziness, seizures, syncope, weakness, light-headedness, numbness and headaches.  Hematological: Denies adenopathy. Easy bruising, personal or family bleeding history  Psychiatric/Behavioral: Denies suicidal ideation, mood changes,  confusion, nervousness, sleep disturbance and agitation   Past Medical History:  Diagnosis Date  . AF (atrial fibrillation) ([place]) 2009   nonsustained  . Arthritis   . Blood transfusion without reported diagnosis    1960's/ as baby  . Cardiomyopathy secondary 2009   had ablation  . Chronic pain syndrome    back pain  . Dysrhythmia    states no longer a problem  . Heart murmur    states no longer there  . History of kidney stones   . Hyperlipidemia   . Hypertension   . Inguinal hernia    left  . Nephrolithiasis    2 kidney stones  . PSVT (paroxysmal supraventricular tachycardia) ([place])    had ablation  . Scoliosis    severe/ has a lot metal in back per pt/     Past Surgical History:  Procedure Laterality Date  . ABDOMINAL EXPOSURE N/A 09/20/2018   Procedure: ABDOMINAL EXPOSURE;  Surgeon: Angelia Mould, MD;  Location: [facility] OR;  Service: Vascular;  Laterality: N/A;  . ANTERIOR LUMBAR FUSION N/A 09/20/2018   Procedure: Lumbar 5 Sacral 1 Anterior lumbar interbody fusion with Lumbar 3 to Iliac fusion with pedicle screws/Lumbar 2-3 Decompressive laminectomy;  Surgeon: Earnie Larsson, MD;  Location: [place];  Service: Neurosurgery;  Laterality: N/A;  Lumbar 5 Sacral 1 Anterior lumbar interbody fusion with Lumbar 3 to Iliac fusion with pedicle screws/Lumbar 2-3 Decompressive laminectomy  . APPLICATION OF ROBOTIC ASSISTANCE FOR SPINAL PROCEDURE N/A 09/20/2018   Procedure: APPLICATION OF ROBOTIC ASSISTANCE FOR SPINAL PROCEDURE;  Surgeon: Earnie Larsson, MD;  Location: [place];  Service: Neurosurgery;  Laterality: N/A;  . AV NODE ABLATION     09/16/07. SVT Dr. Caryl Comes  . BACK SURGERY     01/12/00 and 1982/ has had 6 surgeries on back  . COLONOSCOPY    .  INGUINAL HERNIA REPAIR     Left  . LUMBAR WOUND DEBRIDEMENT N/A 09/26/2018   Procedure: LUMBAR WOUND DEBRIDEMENT Placement of lumbar drain;  Surgeon: Earnie Larsson, MD;  Location: [place];  Service: Neurosurgery;  Laterality: N/A;  LUMBAR WOUND  DEBRIDEMENT Placement of lumbar drain  . POSTERIOR LUMBAR FUSION 4 LEVEL N/A 09/20/2018   Procedure: POSTERIOR LUMBAR FUSION 4 LEVEL;  Surgeon: Earnie Larsson, MD;  Location: [place];  Service: Neurosurgery;  Laterality: N/A;  . REPLACEMENT TOTAL KNEE     1999, L. Murphy/wainer.   Marland Kitchen TOTAL HIP ARTHROPLASTY Right 12/07/2016   Procedure: TOTAL HIP ARTHROPLASTY ANTERIOR APPROACH;  Surgeon: Frederik Pear, MD;  Location: [place];  Service: Orthopedics;  Laterality: Right;    Family History  Problem Relation Age of Onset  . Breast cancer Mother   . Diabetes Father   . Heart attack Father        late 34s  . Atrial fibrillation Father   . Liver disease Paternal Uncle     SOCIAL HX:   reports that he has been smoking cigarettes. He has a 20.00 pack-year smoking history. He has never used smokeless tobacco. He reports current alcohol use of about 12.0 standard drinks of alcohol per week. He reports that he does not use drugs.   Current Outpatient Medications:  .  acetaminophen (TYLENOL) 325 MG tablet, Take 650 mg by mouth every 6 (six) hours as needed for moderate pain. , Disp: , Rfl:  .  amoxicillin-clavulanate (AUGMENTIN) 875-125 MG tablet, Take 1 tablet by mouth 2 (two) times daily., Disp: 20 tablet, Rfl: 0 .  diphenhydrAMINE (BENADRYL) 25 MG tablet, Take 25 mg by mouth at bedtime as needed for allergies. , Disp: , Rfl:  .  esomeprazole (NEXIUM) 40 MG packet, Take 40 mg by mouth daily before breakfast., Disp: , Rfl:  .  Multiple Vitamin (MULTIVITAMIN WITH MINERALS) TABS tablet, Take 1 tablet by mouth daily. Centrum Silver, Disp: , Rfl:   EXAM:   VITALS per patient if applicable: none reported  GENERAL: alert, oriented, appears well and in no acute distress  HEENT: atraumatic, conjunttiva clear, no obvious abnormalities on inspection of external nose and ears  NECK: normal movements of the head and neck  LUNGS: on inspection no signs of respiratory distress, breathing rate appears normal, no  obvious gross increased work of breathing, gasping or wheezing  CV: no obvious cyanosis  MS: moves all visible extremities without noticeable abnormality  PSYCH/NEURO: pleasant and cooperative, no obvious depression or anxiety, speech and thought processing grossly intact  ASSESSMENT AND PLAN:   Acute non-recurrent maxillary sinusitis  -Advised to complete augmentin course as prescribed. -Tylenol sinus, mucinex and afrin for 5 days should help with symptomatic relief. -RTC if no improvement in 10-14 days.   I discussed the assessment and treatment plan with the patient. The patient was provided an opportunity to ask questions and all were answered. The patient agreed with the plan and demonstrated an understanding of the instructions.   The patient was advised to call back or seek an in-person evaluation if the symptoms worsen or if the condition fails to improve as anticipated.    Lelon Frohlich, MD  [facility]

## 2019-04-06 ENCOUNTER — Telehealth: Payer: Self-pay | Admitting: Family Medicine

## 2019-04-06 MED ORDER — LEVOFLOXACIN 500 MG PO TABS: 500.0000 mg | ORAL_TABLET | Freq: Every day | ORAL | 0 refills | Status: DC

## 2019-04-06 NOTE — Telephone Encounter (Signed)
Pt states he is still suffering from symptoms of sinus infection from 03/31/2019. Pt states he is no better, and may be feeling worse.  Abx and OTC treatments have made no improvements and pt would like to speak with Dr Yong Channel his assistant about next steps.  Pt would also like to note that he is not sleeping well due to symptoms and is experiencing a lot of pain.  Please call pt: [phone]

## 2019-04-06 NOTE — Telephone Encounter (Signed)
Hi Dr.Hernandez  Sending you this message since Dr. Yong Channel is out of the office until Monday and pt saw you last. Pt was seen 03/31/2019 and since it has not been over 7 days we cannot schedule a VV for pt. Pt is just asking for a stronger abx. Pt stated he advise that Augmentin was not strong enough since he had developed immune to it in the past when he had a staph infection.   Please advise

## 2019-04-06 NOTE — Telephone Encounter (Signed)
Rx sent to pharmacy. Pt notified of updated as well as plan and verbalized understandning.   Noted! Thanks for your prompt reply.

## 2019-04-06 NOTE — Telephone Encounter (Signed)
Spoke to pt and he stated that he is not any better. Pt stated that he has seen 2 doctors in in the past pain 1-5. Pt stated that he has finished the abx and has taking the the OTC meds  And nasal spray but denies relief. Pt also stated that he has told both providers that "Augmentin does not work with his immune sx  its not strong enough" . Pt stated he don't know what else to do. Pt was informed that Dr.Hunter is out of the office but I will try to get him help. Pt verbalized understanding.

## 2019-04-06 NOTE — Telephone Encounter (Signed)
Give him a 7 day course of levaquin 500 mg daily. If no improvement then, may need to see ENT.

## 2019-04-26 ENCOUNTER — Telehealth: Payer: Self-pay | Admitting: Family Medicine

## 2019-04-26 NOTE — Telephone Encounter (Signed)
I called the patient to schedule AWV with Loma Sousa.  He asked me to call him back tomorrow to schedule a phone visit. VDM (DD)

## 2019-05-01 ENCOUNTER — Ambulatory Visit (INDEPENDENT_AMBULATORY_CARE_PROVIDER_SITE_OTHER): Payer: Medicare Other

## 2019-05-01 ENCOUNTER — Other Ambulatory Visit: Payer: Self-pay

## 2019-05-01 DIAGNOSIS — Z0000 Encounter for general adult medical examination without abnormal findings: Secondary | ICD-10-CM | POA: Diagnosis not present

## 2019-05-01 NOTE — Progress Notes (Signed)
This visit is being conducted via phone call due to the COVID-19 pandemic. This patient has given me verbal consent via phone to conduct this visit, patient states they are participating from their home address. Some vital signs may be absent or patient reported.   Patient identification: identified by name, DOB, and current address.   Subjective:   [name] Matta. is a 53 y.o. male who presents for Medicare Annual/Subsequent preventive examination.  Review of Systems:   Cardiac Risk Factors include: male gender;sedentary lifestyle;smoking/ tobacco exposure     Objective:    Vitals: There were no vitals taken for this visit.  There is no height or weight on file to calculate BMI.  Advanced Directives 05/01/2019 09/20/2018 09/12/2018 09/07/2018 08/03/2018 12/08/2016 12/01/2016  Does Patient Have a Medical Advance Directive? No No No No No No No  Would patient like information on creating a medical advance directive? No - Patient declined No - Patient declined No - Patient declined - - No - Patient declined No - Patient declined    Tobacco Social History   Tobacco Use  Smoking Status Current Every Day Smoker  . Packs/day: 0.30  . Years: 20.00  . Pack years: 6.00  . Types: Cigarettes  . Last attempt to quit: 08/18/1991  . Years since quitting: 27.7  Smokeless Tobacco Never Used     Ready to quit: No Counseling given: Yes   Clinical Intake:  Pre-visit preparation completed: Yes  Pain : No/denies pain     Diabetes: No  How often do you need to have someone help you when you read instructions, pamphlets, or other written materials from your doctor or pharmacy?: 1 - Never  Interpreter Needed?: No  Information entered by :: Denman George LPN  Past Medical History:  Diagnosis Date  . AF (atrial fibrillation) ([place]) 2009   nonsustained  . Arthritis   . Blood transfusion without reported diagnosis    1960's/ as baby  . Cardiomyopathy secondary 2009   had ablation  .  Chronic pain syndrome    back pain  . Dysrhythmia    states no longer a problem  . Heart murmur    states no longer there  . History of kidney stones   . Hyperlipidemia   . Hypertension   . Inguinal hernia    left  . Nephrolithiasis    2 kidney stones  . PSVT (paroxysmal supraventricular tachycardia) ([place])    had ablation  . Scoliosis    severe/ has a lot metal in back per pt/    Past Surgical History:  Procedure Laterality Date  . ABDOMINAL EXPOSURE N/A 09/20/2018   Procedure: ABDOMINAL EXPOSURE;  Surgeon: Angelia Mould, MD;  Location: [facility] OR;  Service: Vascular;  Laterality: N/A;  . ANTERIOR LUMBAR FUSION N/A 09/20/2018   Procedure: Lumbar 5 Sacral 1 Anterior lumbar interbody fusion with Lumbar 3 to Iliac fusion with pedicle screws/Lumbar 2-3 Decompressive laminectomy;  Surgeon: Earnie Larsson, MD;  Location: [place];  Service: Neurosurgery;  Laterality: N/A;  Lumbar 5 Sacral 1 Anterior lumbar interbody fusion with Lumbar 3 to Iliac fusion with pedicle screws/Lumbar 2-3 Decompressive laminectomy  . APPLICATION OF ROBOTIC ASSISTANCE FOR SPINAL PROCEDURE N/A 09/20/2018   Procedure: APPLICATION OF ROBOTIC ASSISTANCE FOR SPINAL PROCEDURE;  Surgeon: Earnie Larsson, MD;  Location: [place];  Service: Neurosurgery;  Laterality: N/A;  . AV NODE ABLATION     09/16/07. SVT Dr. Caryl Comes  . BACK SURGERY     01/12/00 and  1982/ has had 6 surgeries on back  . COLONOSCOPY    . INGUINAL HERNIA REPAIR     Left  . LUMBAR WOUND DEBRIDEMENT N/A 09/26/2018   Procedure: LUMBAR WOUND DEBRIDEMENT Placement of lumbar drain;  Surgeon: Earnie Larsson, MD;  Location: [place];  Service: Neurosurgery;  Laterality: N/A;  LUMBAR WOUND DEBRIDEMENT Placement of lumbar drain  . POSTERIOR LUMBAR FUSION 4 LEVEL N/A 09/20/2018   Procedure: POSTERIOR LUMBAR FUSION 4 LEVEL;  Surgeon: Earnie Larsson, MD;  Location: [place];  Service: Neurosurgery;  Laterality: N/A;  . REPLACEMENT TOTAL KNEE     1999, L. Murphy/wainer.   Marland Kitchen TOTAL HIP  ARTHROPLASTY Right 12/07/2016   Procedure: TOTAL HIP ARTHROPLASTY ANTERIOR APPROACH;  Surgeon: Frederik Pear, MD;  Location: [place];  Service: Orthopedics;  Laterality: Right;   Family History  Problem Relation Age of Onset  . Breast cancer Mother   . Diabetes Father   . Heart attack Father        late 94s  . Atrial fibrillation Father   . Liver disease Paternal Uncle    Social History   Socioeconomic History  . Marital status: Divorced    Spouse name: Not on file  . Number of children: Not on file  . Years of education: Not on file  . Highest education level: Not on file  Occupational History  . Occupation: disabled   Social Needs  . Financial resource strain: Not on file  . Food insecurity    Worry: Not on file    Inability: Not on file  . Transportation needs    Medical: Not on file    Non-medical: Not on file  Tobacco Use  . Smoking status: Current Every Day Smoker    Packs/day: 0.30    Years: 20.00    Pack years: 6.00    Types: Cigarettes    Last attempt to quit: 08/18/1991    Years since quitting: 27.7  . Smokeless tobacco: Never Used  Substance and Sexual Activity  . Alcohol use: Yes    Alcohol/week: 12.0 standard drinks    Types: 12 Cans of beer per week    Comment: weekly  . 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 Sexual activity: Not on file  Lifestyle  . Physical activity    Days per week: Not on file    Minutes per session: Not on file  . Stress: Not on file  Relationships  . Social Herbalist on phone: Not on file    Gets together: Not on file    Attends religious service: Not on file    Active member of club or organization: Not on file    Attends meetings of clubs or organizations: Not on file    Relationship status: Not on file  Other Topics Concern  . Not on file  Social History Narrative   Single, divorced. 2 daughters. No grandkids.       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 Out 2001.       Primary  caregiver for father who lives with him     Outpatient Encounter Medications as of 05/01/2019  Medication Sig  . acetaminophen (TYLENOL) 325 MG tablet Take 650 mg by mouth every 6 (six) hours as needed for moderate pain.   . diphenhydrAMINE (BENADRYL) 25 MG tablet Take 25 mg by mouth at bedtime as needed for allergies.   Marland Kitchen esomeprazole (NEXIUM) 40 MG packet Take 40  mg by mouth daily before breakfast.  . Multiple Vitamin (MULTIVITAMIN WITH MINERALS) TABS tablet Take 1 tablet by mouth daily. Centrum Silver  . [DISCONTINUED] levofloxacin (LEVAQUIN) 500 MG tablet Take 1 tablet (500 mg total) by mouth daily.   No facility-administered encounter medications on file as of 05/01/2019.     Activities of Daily Living In your present state of health, do you have any difficulty performing the following activities: 05/01/2019 09/20/2018  Hearing? N N  Vision? N N  Difficulty concentrating or making decisions? N N  Walking or climbing stairs? N Y  Dressing or bathing? N Y  Doing errands, shopping? N N  Preparing Food and eating ? N -  Using the Toilet? N -  In the past six months, have you accidently leaked urine? N -  Do you have problems with loss of bowel control? N -  Managing your Medications? N -  Managing your Finances? N -  Housekeeping or managing your Housekeeping? N -  Some recent data might be hidden    Patient Care Team: Marin Olp, MD as PCP - General (Family Medicine) Earnie Larsson, MD as Consulting Physician (Neurosurgery) Angelia Mould, MD as Consulting Physician (Vascular Surgery)   Assessment:   This is a routine wellness examination for [place].  Exercise Activities and Dietary recommendations Current Exercise Habits: The patient does not participate in regular exercise at present  Goals   None     Fall Risk Fall Risk  05/01/2019 10/13/2017  Falls in the past year? 0 No  Number falls in past yr: 0 -  Injury with Fall? 0 -  Follow up Falls evaluation  completed -   Is the patient's home free of loose throw rugs in walkways, pet beds, electrical cords, etc?   yes      Grab bars in the bathroom? yes      Handrails on the stairs?   yes      Adequate lighting?   yes  Depression Screen PHQ 2/9 Scores 05/01/2019 10/13/2017  PHQ - 2 Score 0 0    Cognitive Function-no cognitive concerns at this time  Alert? Yes         Normal Appearance? N/a  Oriented to person? Yes           Place? Yes  Time? Yes  Recall of three objects? Yes  Can perform simple calculations? Yes  Displays appropriate judgment? Yes  Can read the correct time from a watch face? Yes     Immunization History  Administered Date(s) Administered  . Influenza Split 06/19/2011  . Influenza Whole 05/20/2009, 06/16/2010  . Influenza,inj,Quad PF,6+ Mos 08/01/2013, 07/03/2014, 06/21/2015  . Influenza-Unspecified 06/22/2016  . Pneumococcal Conjugate-13 07/03/2014  . Td 01/06/2010    Qualifies for Shingles Vaccine? Discussed and patient will check with pharmacy for coverage.  Patient education handout provided    Screening Tests Health Maintenance  Topic Date Due  . INFLUENZA VACCINE  03/18/2019  . TETANUS/TDAP  01/07/2020  . COLONOSCOPY  09/08/2021  . Hepatitis C Screening  Completed  . HIV Screening  Completed   Cancer Screenings: Lung: Low Dose CT Chest recommended if Age 29-80 years, 30 pack-year currently smoking OR have quit w/in 15years. Patient does not qualify. Colorectal: completed 09/08/16 with Dr. Havery Moros     Plan:    I have personally reviewed and addressed the Medicare Annual Wellness questionnaire and have noted the following in the patient's chart:  A. Medical and social history B. Use  of alcohol, tobacco or illicit drugs  C. Current medications and supplements D. Functional ability and status E.  Nutritional status F.  Physical activity G. Advance directives H. List of other physicians I.  Hospitalizations, surgeries, and ER visits in  previous 12 months J.  [place] such as hearing and vision if needed, cognitive and depression L. Referrals, records requested, and appointments- none   In addition, I have reviewed and discussed with patient certain preventive protocols, quality metrics, and best practice recommendations. A written personalized care plan for preventive services as well as general preventive health recommendations were provided to patient.   Signed,  Denman George, LPN  Nurse Health Advisor   Nurse Notes:  Requested that patient call back and rescheduled cancelled physical.

## 2019-05-01 NOTE — Progress Notes (Signed)
I have reviewed and agree with note, evaluation, plan.  Agree with patient calling to set up a physical. Garret Reddish, MD

## 2019-05-01 NOTE — Patient Instructions (Addendum)
[name] White , Thank you for taking time to come for your Medicare Wellness Visit. I appreciate your ongoing commitment to your health goals. Please review the following plan we discussed and let me know if I can assist you in the future.   Screening recommendations/referrals: Colorectal Screening: up to date; last 09/08/16   Vision and Dental Exams: Recommended annual ophthalmology exams for early detection of glaucoma and other disorders of the eye Recommended annual dental exams for proper oral hygiene  Vaccinations: Influenza vaccine:  recommended this fall either at PCP office or through your local pharmacy  Pneumococcal vaccine: up to date; last 07/03/14 Tdap vaccine:up to date; last 01/06/10 Shingles vaccine: Please call your insurance company to determine your out of pocket expense for the Shingrix vaccine. You may receive this vaccine at your local pharmacy.  Advanced directives: Information and forms are available for living wills and/or healthcare power of attorney should you want to consider in the future.  I am also available to assist in the completion of those documents.   Goals: Recommend to continue efforts to reduce smoking habits until no longer smoking. Smoking Cessation literature is attached below.  Next appointment: Please schedule your Annual Wellness Visit with your Nurse Health Advisor in one year. Please call back to reschedule physical with Dr. Yong Channel   Preventive Care 40-64 Years, Male Preventive care refers to lifestyle choices and visits with your health care provider that can promote health and wellness. What does preventive care include?  A yearly physical exam. This is also called an annual well check.  Dental exams once or twice a year.  Routine eye exams. Ask your health care provider how often you should have your eyes checked.  Personal lifestyle choices, including:  Daily care of your teeth and gums.  Regular physical activity.  Eating a healthy  diet.  Avoiding tobacco and drug use.  Limiting alcohol use.  Practicing safe sex.  Taking low-dose aspirin every day starting at age 64 if recommended by your health care provider.  Taking vitamin and mineral supplements as recommended by your health care provider. What happens during an annual well check? The services and screenings done by your health care provider during your annual well check will depend on your age, overall health, lifestyle risk factors, and family history of disease. Counseling  Your health care provider may ask you questions about your:  Alcohol use.  Tobacco use.  Drug use.  Emotional well-being.  Home and relationship well-being.  Sexual activity.  Eating habits.  Work and work Statistician. Screening  You may have the following tests or measurements:  Height, weight, and BMI.  Blood pressure.  Lipid and cholesterol levels. These may be checked every 5 years, or more frequently if you are over 60 years old.  Skin check.  Lung cancer screening. You may have this screening every year starting at age 99 if you have a 30-pack-year history of smoking and currently smoke or have quit within the past 15 years.  Fecal occult blood test (FOBT) of the stool. You may have this test every year starting at age 65.  Flexible sigmoidoscopy or colonoscopy. You may have a sigmoidoscopy every 5 years or a colonoscopy every 10 years starting at age 9.  Prostate cancer screening. Recommendations will vary depending on your family history and other risks.  Hepatitis C blood test.  Hepatitis B blood test.  Sexually transmitted disease (STD) testing.  Diabetes screening. This is done by checking your blood sugar (  glucose) after you have not eaten for a while (fasting). You may have this done every 1-3 years. Discuss your test results, treatment options, and if necessary, the need for more tests with your health care provider. Vaccines  Your health care  provider may recommend certain vaccines, such as:  Influenza vaccine. This is recommended every year.  Tetanus, diphtheria, and acellular pertussis (Tdap, Td) vaccine. You may need a Td booster every 10 years.  Zoster vaccine. You may need this after age 69.  Pneumococcal 13-valent conjugate (PCV13) vaccine. You may need this if you have certain conditions and have not been vaccinated.  Pneumococcal polysaccharide (PPSV23) vaccine. You may need one or two doses if you smoke cigarettes or if you have certain conditions. Talk to your health care provider about which screenings and vaccines you need and how often you need them. This information is not intended to replace advice given to you by your health care provider. Make sure you discuss any questions you have with your health care provider. Document Released: 08/30/2015 Document Revised: 04/22/2016 Document Reviewed: 06/04/2015 Elsevier Interactive Patient Education  2017 [employer].

## 2019-05-24 DIAGNOSIS — M5137 Other intervertebral disc degeneration, lumbosacral region: Secondary | ICD-10-CM | POA: Diagnosis not present

## 2019-05-24 DIAGNOSIS — Z6830 Body mass index (BMI) 30.0-30.9, adult: Secondary | ICD-10-CM | POA: Diagnosis not present

## 2019-05-24 DIAGNOSIS — I10 Essential (primary) hypertension: Secondary | ICD-10-CM | POA: Diagnosis not present

## 2019-08-07 ENCOUNTER — Ambulatory Visit (INDEPENDENT_AMBULATORY_CARE_PROVIDER_SITE_OTHER): Payer: Medicare Other | Admitting: Physician Assistant

## 2019-08-07 ENCOUNTER — Encounter: Payer: Self-pay | Admitting: Physician Assistant

## 2019-08-07 VITALS — Ht 70.0 in | Wt 215.0 lb

## 2019-08-07 DIAGNOSIS — Z7189 Other specified counseling: Secondary | ICD-10-CM

## 2019-08-07 DIAGNOSIS — J0100 Acute maxillary sinusitis, unspecified: Secondary | ICD-10-CM | POA: Diagnosis not present

## 2019-08-07 MED ORDER — LEVOFLOXACIN 500 MG PO TABS: 500.0000 mg | ORAL_TABLET | Freq: Every day | ORAL | 0 refills | Status: DC

## 2019-08-07 NOTE — Progress Notes (Signed)
Virtual Visit via Video   I connected with [name] White. on 08/07/19 at 11:20 AM EST by a video enabled telemedicine application and verified that I am speaking with the correct person using two identifiers. Location patient: Home Location provider: [facility], Office Persons participating in the virtual visit: Nettie Facenda., Inda Coke PA-C, [name]  I discussed the limitations of evaluation and management by telemedicine and the availability of in person appointments. The patient expressed understanding and agreed to proceed.  I,Ellory Khurana,acting as a Education administrator for [employer], PA.,have documented all relevant documentation on the behalf of Inda Coke, PA,as directed by  Inda Coke, PA while in the presence of Inda Coke, [place].  Subjective:   HPI:   Patient is requesting evaluation for possible COVID-19. Patient has test scheduled Wednesday at noon.   Symptom onset: Late Saturday   Travel/contacts: Father that he is caregiver to is in [place] for Covid now, last seen by him on Thursday when he had to call EMS  Patient endorses the following symptoms: sinus headache, sinus congestion, sinus pain and rhinorrhea, sneezing, under eye pressure, PND with slight cough, diminished sense of smell "things smell burnt"  Patient denies the following symptoms: ear pain, ear drainage, sore throat, shortness of breath, chest tightness, chest pain and myalgia, loss of taste  Treatments tried: antihistamines and hydration; patient does have improvement with sitting down or when in bed  Patient risk factors: Current [id number] risk of complications score: 3  Smoking status: [name] White.  reports that he has been smoking cigarettes. He has a 6.00 pack-year smoking history. He has never used smokeless tobacco. If male, currently pregnant? []   Yes [x]   No  ROS: See pertinent positives and negatives per HPI.  Patient Active Problem List   Diagnosis  Date Noted  . Status post spinal surgery 09/20/2018  . Degenerative spondylolisthesis 09/20/2018  . Arthritis of right hip 12/07/2016  . Primary osteoarthritis of right hip 12/02/2016  . History of colon polyps 09/16/2016  . Genital warts 07/06/2016  . Osteoarthritis of right knee 12/04/2014  . Former smoker 12/04/2014  . Hyperglycemia 05/27/2012  . SVT/ PSVT/ PAT 06/16/2010  . SCOLIOSIS, THORACOLUMBAR 01/06/2010  . Hyperlipidemia 04/14/2007  . Essential hypertension 03/07/2007    Social History   Tobacco Use  . Smoking status: Current Every Day Smoker    Packs/day: 0.30    Years: 20.00    Pack years: 6.00    Types: Cigarettes    Last attempt to quit: 08/18/1991    Years since quitting: 27.9  . Smokeless tobacco: Never Used  Substance Use Topics  . Alcohol use: Yes    Alcohol/week: 12.0 standard drinks    Types: 12 Cans of beer per week    Comment: weekly    Current Outpatient Medications:  .  acetaminophen (TYLENOL) 325 MG tablet, Take 650 mg by mouth every 6 (six) hours as needed for moderate pain. , Disp: , Rfl:  .  diphenhydrAMINE (BENADRYL) 25 MG tablet, Take 25 mg by mouth at bedtime as needed for allergies. , Disp: , Rfl:  .  esomeprazole (NEXIUM) 40 MG packet, Take 40 mg by mouth daily before breakfast., Disp: , Rfl:  .  Multiple Vitamin (MULTIVITAMIN WITH MINERALS) TABS tablet, Take 1 tablet by mouth daily. Centrum Silver, Disp: , Rfl:  .  levofloxacin (LEVAQUIN) 500 MG tablet, Take 1 tablet (500 mg total) by mouth daily., Disp: 7 tablet, Rfl: 0  No Known Allergies  Objective:   VITALS: Per patient if applicable, see vitals. GENERAL: Alert, appears well and in no acute distress. HEENT: Atraumatic, conjunctiva clear, no obvious abnormalities on inspection of external nose and ears. NECK: Normal movements of the head and neck. CARDIOPULMONARY: No increased WOB. Speaking in clear sentences. I:E ratio WNL.  MS: Moves all visible extremities without noticeable  abnormality. PSYCH: Pleasant and cooperative, well-groomed. Speech normal rate and rhythm. Affect is appropriate. Insight and judgement are appropriate. Attention is focused, linear, and appropriate.  NEURO: CN grossly intact. Oriented as arrived to appointment on time with no prompting. Moves both UE equally.  SKIN: No obvious lesions, wounds, erythema, or cyanosis noted on face or hands.  Assessment and Plan:   [name] White was seen today for sinusitis.  Diagnoses and all orders for this visit:  Acute non-recurrent maxillary sinusitis  Advice given about COVID-19 virus infection  Other orders -     levofloxacin (LEVAQUIN) 500 MG tablet; Take 1 tablet (500 mg total) by mouth daily.   Patient has a respiratory illness without signs of acute distress or respiratory compromise at this time.   Patient's symptoms are consistent with past sinus infections. Patient is requesting Levaquin and states that other antibiotics that have been trialed in the past do not work well for him -- including augmentin and azithromycin.  Patient has COVID-19 testing scheduled for Wednesday. As a precaution, they have been advised to remain home until COVID-19 results and then possible further quarantine after that based on results and symptoms. Advised if they experience a "second sickening" or worsening symptoms as the illness progresses, they are to call the office for further instructions or seek emergent evaluation for any severe symptoms.   . Reviewed expectations re: course of current medical issues. . Discussed self-management of symptoms. . Outlined signs and symptoms indicating need for more acute intervention. . Patient verbalized understanding and all questions were answered. Marland Kitchen Health Maintenance issues including appropriate healthy diet, exercise, and smoking avoidance were discussed with patient. . See orders for this visit as documented in the electronic medical record.  I discussed the assessment and  treatment plan with the patient. The patient was provided an opportunity to ask questions and all were answered. The patient agreed with the plan and demonstrated an understanding of the instructions.   The patient was advised to call back or seek an in-person evaluation if the symptoms worsen or if the condition fails to improve as anticipated.   CMA or LPN served as scribe during this visit. History, Physical, and Plan performed by medical provider. The above documentation has been reviewed and is accurate and complete.  [place], [place] 08/07/2019

## 2019-08-09 ENCOUNTER — Ambulatory Visit: Payer: Medicare Other | Attending: Internal Medicine

## 2019-08-09 DIAGNOSIS — Z20822 Contact with and (suspected) exposure to covid-19: Secondary | ICD-10-CM

## 2019-08-11 LAB — NOVEL CORONAVIRUS, NAA: SARS-CoV-2, NAA: DETECTED — AB

## 2019-09-15 ENCOUNTER — Ambulatory Visit: Payer: Medicare Other | Attending: Internal Medicine

## 2019-09-15 DIAGNOSIS — Z20822 Contact with and (suspected) exposure to covid-19: Secondary | ICD-10-CM

## 2019-09-16 LAB — NOVEL CORONAVIRUS, NAA: SARS-CoV-2, NAA: NOT DETECTED

## 2019-09-26 ENCOUNTER — Telehealth: Payer: Self-pay | Admitting: Family Medicine

## 2019-09-26 NOTE — Telephone Encounter (Signed)
If Yong Channel has something left this week pt can be seen, if not pt will need to see vascular to be evaluated.

## 2019-09-26 NOTE — Telephone Encounter (Signed)
Patient called in this afternoon wanting advice of what he should do as his R ankle has been swelling up for 2-3 days and he does not remember injuring it or anything and he says it does not hurt but is concerned, he would like to know he needs an appointment with [place] or with his vascular doctor.

## 2019-09-27 NOTE — Telephone Encounter (Signed)
Got him scheduled for tomorrow!

## 2019-09-28 ENCOUNTER — Ambulatory Visit (INDEPENDENT_AMBULATORY_CARE_PROVIDER_SITE_OTHER): Payer: Medicare Other | Admitting: Family Medicine

## 2019-09-28 ENCOUNTER — Other Ambulatory Visit: Payer: Self-pay

## 2019-09-28 ENCOUNTER — Encounter: Payer: Self-pay | Admitting: Family Medicine

## 2019-09-28 VITALS — BP 160/100 | HR 101 | Temp 98.7°F | Ht 70.0 in | Wt 211.6 lb

## 2019-09-28 DIAGNOSIS — R739 Hyperglycemia, unspecified: Secondary | ICD-10-CM

## 2019-09-28 DIAGNOSIS — R600 Localized edema: Secondary | ICD-10-CM

## 2019-09-28 DIAGNOSIS — Z0000 Encounter for general adult medical examination without abnormal findings: Secondary | ICD-10-CM

## 2019-09-28 DIAGNOSIS — E785 Hyperlipidemia, unspecified: Secondary | ICD-10-CM | POA: Diagnosis not present

## 2019-09-28 DIAGNOSIS — I10 Essential (primary) hypertension: Secondary | ICD-10-CM

## 2019-09-28 LAB — D-DIMER, QUANTITATIVE: D-Dimer, Quant: 0.28 mcg/mL FEU (ref <0.50)

## 2019-09-28 MED ORDER — LISINOPRIL 20 MG PO TABS: 20.0000 mg | ORAL_TABLET | Freq: Every day | ORAL | 3 refills | Status: DC

## 2019-09-28 NOTE — Patient Instructions (Addendum)
Poor control of blood pressure today- we will restart lisinopril 20 mg. He will monitor blood pressure at home for next 2-3 weeks and update me- may need to restart amlodipine as well- holding off for now due to some ankle swelling until we can investigate that further  Please stop by lab before you go- doing physical labs for next month as well If you do not have mychart- we will call you about results within 5 business days of [place] receiving them.  If you have mychart- we will send your results within 3 business days of [place] receiving them.  If abnormal or we want to clarify a result, we will call or mychart you to make sure you receive the message.  If you have questions or concerns or don't hear within 5-7 days, please send [place] a message or call us.   If d dimer is high- we will need to get ultrasound of the calf.   I suspect this is most likely swelling related to prior hip surgery and veins just not flowing as well and you could try compression stocking on that leg- with that being said lets get bloodwork back first  Recommended follow up: scheduled for physical next month- sooner if needed if leg swelling worsens or you have other concerns

## 2019-09-28 NOTE — Progress Notes (Signed)
Phone [phone] In person visit   Subjective:   [name] White. is a 54 y.o. year old very pleasant male patient who presents for/with See problem oriented charting Chief Complaint  Patient presents with  . Joint Swelling    right ankle     This visit occurred during the SARS-CoV-2 public health emergency.  Safety protocols were in place, including screening questions prior to the visit, additional usage of staff PPE, and extensive cleaning of exam room while observing appropriate contact time as indicated for disinfecting solutions.   Past Medical History-  Patient Active Problem List   Diagnosis Date Noted  . SVT/ PSVT/ PAT 06/16/2010    Priority: High  . Genital warts 07/06/2016    Priority: Medium  . Hyperglycemia 05/27/2012    Priority: Medium  . SCOLIOSIS, THORACOLUMBAR 01/06/2010    Priority: Medium  . Hyperlipidemia 04/14/2007    Priority: Medium  . Essential hypertension 03/07/2007    Priority: Medium  . Osteoarthritis of right knee 12/04/2014    Priority: Low  . Former smoker 12/04/2014    Priority: Low  . Status post spinal surgery 09/20/2018  . Degenerative spondylolisthesis 09/20/2018  . Arthritis of right hip 12/07/2016  . Primary osteoarthritis of right hip 12/02/2016  . History of colon polyps 09/16/2016    Medications- reviewed and updated Current Outpatient Medications  Medication Sig Dispense Refill  . acetaminophen (TYLENOL) 325 MG tablet Take 650 mg by mouth every 6 (six) hours as needed for moderate pain.     . diphenhydrAMINE (BENADRYL) 25 MG tablet Take 25 mg by mouth at bedtime as needed for allergies.     Marland Kitchen esomeprazole (NEXIUM) 40 MG packet Take 40 mg by mouth daily before breakfast.    . Multiple Vitamin (MULTIVITAMIN WITH MINERALS) TABS tablet Take 1 tablet by mouth daily. Centrum Silver    . lisinopril (ZESTRIL) 20 MG tablet Take 1 tablet (20 mg total) by mouth daily. 90 tablet 3   No current facility-administered medications for  this visit.     Objective:  BP (!) 160/100   Pulse (!) 101   Temp 98.7 F (37.1 C) (Temporal)   Ht 5\' 10"  (1.778 m)   Wt 211 lb 9.6 oz (96 kg)   SpO2 98%   BMI 30.36 kg/m  Gen: NAD, resting comfortably CV: RRR no murmurs rubs or gallops Lungs: CTAB no crackles, wheeze, rhonchi Abdomen: soft/nontender/nondistended/normal bowel sounds. No rebound or guarding.  Ext: no edema on the left, trace edema in right lower extremity.  With no calf pain on either side. Skin: warm, dry    Assessment and Plan   # Right lower leg swelling S:Started about a week ago. Has not had any pain. Has had decreased feeling for a while due to back issues. Had surgery in his back about a year ago. Has improvement with swelling when he gets up in the morning.   Intermittent issues with swelling in feet since at least 2017 (foot also feels somewhat asleep)and some decrease in right foot range of motion. Right hip surgery 2018 and felt like he could move foot better and had better sensation- surgeon was not sure why he had improvement but symptoms later worsened. He has noted 1-2 weeks ago more swelling in right ankle. If sits and rests it looks better but then swelling worsens. No fall or injury in last 2 weeks  No shortness of breath. HR runs high normal.  A/P: Patient with right lower leg swelling which  is new over the last 2 weeks or so.  He wants to make sure there is no blood clot.  We will check D-dimer.  Other labs are ordered since we will be seeing him for physical next month-these will also rule out other causes of edema new onset and focus more on bilateral edema and unilateral edema like he has-I suspect his edema may be related to prior hip surgery not as adequate lymphatic drainage now-encouraged use of compression stocking if labs come back reassuring -No shortness of breath.  I doubt pulmonary embolism.  Patient has baseline high heart rate so I do not think tachycardia which is mild is related to  pulmonary embolism potential.   #hypertension S: currently not on blood pressure medicine. With back surgery in 2020 (states finally able to straighten up more as had multiple fusions- he feels better overall but has less flexibility)- blood pressure was doing well in the hospital and he was discharged without medicine. Checked at home for a bit and was controlled. Has gained some weight since that time- states about 25-30 lbs and thinks he probably needs something again  Prior had been on amlodipine 10 mg and lisinopril 20 mg.  BP Readings from Last 3 Encounters:  09/28/19 (!) 160/100  10/07/18 132/80  09/12/18 (!) 153/95  A/P:  Poor control of blood pressure today- we will restart lisinopril 20 mg. He will monitor blood pressure at home for next 2-3 weeks and update me- may need to restart amlodipine as well- holding off for now due to some ankle swelling until we can investigate that further    # smoking- 1/2 PPD- encouraged full cessation.  He is not ready to quit but wants to continue to work on cutting down slowly  #Social update-Lost dad Aug 10 2019.  His dad had been a patient of mine a few years ago before transferring to the [place] for full-time care  Recommended follow up: Scheduled for physical next month Future Appointments  Date Time Provider [place]  11/07/2019  2:40 PM Marin Olp, MD [facility]    Lab/Order associations:   ICD-10-CM   1. Essential hypertension  I10   2. Leg edema, right  R60.0 D-Dimer, Quantitative  3. Hyperlipidemia, unspecified hyperlipidemia type  E78.5 CBC with Differential/Platelet    Comprehensive metabolic panel    Lipid panel    TSH  4. Hyperglycemia  R73.9 Hemoglobin A1c  5. Preventative health care  Z00.00 CBC with Differential/Platelet    Comprehensive metabolic panel    Lipid panel    TSH    Hemoglobin A1c  Numbers 3 through 5 are related to upcoming physical  Meds ordered this encounter  Medications  . lisinopril  (ZESTRIL) 20 MG tablet    Sig: Take 1 tablet (20 mg total) by mouth daily.    Dispense:  90 tablet    Refill:  3    Return precautions advised.  Garret Reddish, MD

## 2019-09-29 LAB — LDL CHOLESTEROL, DIRECT: Direct LDL: 139 mg/dL

## 2019-09-29 LAB — CBC WITH DIFFERENTIAL/PLATELET
Basophils Absolute: 0.1 10*3/uL (ref 0.0–0.1)
Basophils Relative: 1.5 % (ref 0.0–3.0)
Eosinophils Absolute: 0.2 10*3/uL (ref 0.0–0.7)
Eosinophils Relative: 2.9 % (ref 0.0–5.0)
HCT: 51.7 % (ref 39.0–52.0)
Hemoglobin: 16.9 g/dL (ref 13.0–17.0)
Lymphocytes Relative: 23.5 % (ref 12.0–46.0)
Lymphs Abs: 1.9 10*3/uL (ref 0.7–4.0)
MCHC: 32.7 g/dL (ref 30.0–36.0)
MCV: 86.5 fl (ref 78.0–100.0)
Monocytes Absolute: 1 10*3/uL (ref 0.1–1.0)
Monocytes Relative: 12.4 % — ABNORMAL HIGH (ref 3.0–12.0)
Neutro Abs: 4.8 10*3/uL (ref 1.4–7.7)
Neutrophils Relative %: 59.7 % (ref 43.0–77.0)
Platelets: 276 10*3/uL (ref 150.0–400.0)
RBC: 5.98 Mil/uL — ABNORMAL HIGH (ref 4.22–5.81)
RDW: 15.4 % (ref 11.5–15.5)
WBC: 8 10*3/uL (ref 4.0–10.5)

## 2019-09-29 LAB — LIPID PANEL
Cholesterol: 209 mg/dL — ABNORMAL HIGH (ref 0–200)
HDL: 45.5 mg/dL (ref >39.00)
NonHDL: 163.2
Total CHOL/HDL Ratio: 5
Triglycerides: 249 mg/dL — ABNORMAL HIGH (ref 0.0–149.0)
VLDL: 49.8 mg/dL — ABNORMAL HIGH (ref 0.0–40.0)

## 2019-09-29 LAB — COMPREHENSIVE METABOLIC PANEL
ALT: 47 U/L (ref 0–53)
AST: 33 U/L (ref 0–37)
Albumin: 4.5 g/dL (ref 3.5–5.2)
Alkaline Phosphatase: 76 U/L (ref 39–117)
BUN: 14 mg/dL (ref 6–23)
CO2: 30 mEq/L (ref 19–32)
Calcium: 10 mg/dL (ref 8.4–10.5)
Chloride: 100 mEq/L (ref 96–112)
Creatinine, Ser: 0.96 mg/dL (ref 0.40–1.50)
GFR: 81 mL/min (ref >60.00)
Glucose, Bld: 98 mg/dL (ref 70–99)
Potassium: 3.7 mEq/L (ref 3.5–5.1)
Sodium: 140 mEq/L (ref 135–145)
Total Bilirubin: 0.7 mg/dL (ref 0.2–1.2)
Total Protein: 7.6 g/dL (ref 6.0–8.3)

## 2019-09-29 LAB — TSH: TSH: 2.08 u[IU]/mL (ref 0.35–4.50)

## 2019-09-29 LAB — HEMOGLOBIN A1C: Hgb A1c MFr Bld: 7 % — ABNORMAL HIGH (ref 4.6–6.5)

## 2019-10-04 DIAGNOSIS — M5137 Other intervertebral disc degeneration, lumbosacral region: Secondary | ICD-10-CM | POA: Diagnosis not present

## 2019-11-07 ENCOUNTER — Other Ambulatory Visit: Payer: Self-pay

## 2019-11-07 ENCOUNTER — Encounter: Payer: Self-pay | Admitting: Family Medicine

## 2019-11-07 ENCOUNTER — Ambulatory Visit (INDEPENDENT_AMBULATORY_CARE_PROVIDER_SITE_OTHER): Payer: Medicare Other | Admitting: Family Medicine

## 2019-11-07 VITALS — BP 122/78 | HR 101 | Temp 97.3°F | Ht 70.0 in | Wt 208.8 lb

## 2019-11-07 DIAGNOSIS — F17200 Nicotine dependence, unspecified, uncomplicated: Secondary | ICD-10-CM | POA: Diagnosis not present

## 2019-11-07 DIAGNOSIS — I471 Supraventricular tachycardia: Secondary | ICD-10-CM | POA: Diagnosis not present

## 2019-11-07 DIAGNOSIS — R21 Rash and other nonspecific skin eruption: Secondary | ICD-10-CM | POA: Diagnosis not present

## 2019-11-07 DIAGNOSIS — E785 Hyperlipidemia, unspecified: Secondary | ICD-10-CM | POA: Diagnosis not present

## 2019-11-07 DIAGNOSIS — R739 Hyperglycemia, unspecified: Secondary | ICD-10-CM | POA: Diagnosis not present

## 2019-11-07 DIAGNOSIS — I10 Essential (primary) hypertension: Secondary | ICD-10-CM | POA: Diagnosis not present

## 2019-11-07 LAB — POC URINALSYSI DIPSTICK (AUTOMATED)
Bilirubin, UA: NEGATIVE
Blood, UA: NEGATIVE
Glucose, UA: NEGATIVE
Ketones, UA: NEGATIVE
Leukocytes, UA: NEGATIVE
Nitrite, UA: NEGATIVE
Protein, UA: NEGATIVE
Spec Grav, UA: 1.02 (ref 1.010–1.025)
Urobilinogen, UA: 0.2 U/dL
pH, UA: 5.5 (ref 5.0–8.0)

## 2019-11-07 MED ORDER — TRIAMCINOLONE ACETONIDE 0.1 % EX CREA: 1.0000 "application " | TOPICAL_CREAM | Freq: Two times a day (BID) | CUTANEOUS | 0 refills | Status: DC

## 2019-11-07 NOTE — Progress Notes (Signed)
Phone: [phone]   Subjective:  Patient presents today for their annual physical. Chief complaint-noted.   See problem oriented charting- Review of Systems  Constitutional: Negative for chills, fever and weight loss.  HENT: Negative for ear pain, hearing loss, nosebleeds and sore throat.   Eyes: Negative for blurred vision and double vision.  Respiratory: Negative for cough, shortness of breath and wheezing.   Cardiovascular: Negative for chest pain, palpitations and leg swelling.  Gastrointestinal: Negative for heartburn, nausea and vomiting.  Genitourinary: Negative for dysuria, frequency and urgency.  Musculoskeletal: Positive for back pain. Negative for joint pain and neck pain.       Chronic back pain   Skin: Positive for itching and rash.       Patient has 2 spots of posion ivy that itch from time to time  Neurological: Negative for dizziness, seizures, weakness and headaches.  Endo/Heme/Allergies: Does not bruise/bleed easily.  Psychiatric/Behavioral: Negative for depression, memory loss and suicidal ideas. The patient does not have insomnia.    The following were reviewed and entered/updated in epic: Past Medical History:  Diagnosis Date  . AF (atrial fibrillation) ([place]) 2009   nonsustained  . Arthritis   . Blood transfusion without reported diagnosis    1960's/ as baby  . Cardiomyopathy secondary 2009   had ablation  . Chronic pain syndrome    back pain  . Dysrhythmia    states no longer a problem  . Heart murmur    states no longer there  . History of kidney stones   . Hyperlipidemia   . Hypertension   . Inguinal hernia    left  . Nephrolithiasis    2 kidney stones  . PSVT (paroxysmal supraventricular tachycardia) ([place])    had ablation  . Scoliosis    severe/ has a lot metal in back per pt/    Patient Active Problem List   Diagnosis Date Noted  . SVT/ PSVT/ PAT 06/16/2010    Priority: High  . Genital warts 07/06/2016    Priority: Medium  .  Hyperglycemia 05/27/2012    Priority: Medium  . SCOLIOSIS, THORACOLUMBAR 01/06/2010    Priority: Medium  . Hyperlipidemia 04/14/2007    Priority: Medium  . Essential hypertension 03/07/2007    Priority: Medium  . Osteoarthritis of right knee 12/04/2014    Priority: Low  . Former smoker 12/04/2014    Priority: Low  . Status post spinal surgery 09/20/2018  . Degenerative spondylolisthesis 09/20/2018  . Arthritis of right hip 12/07/2016  . Primary osteoarthritis of right hip 12/02/2016  . History of colon polyps 09/16/2016   Past Surgical History:  Procedure Laterality Date  . ABDOMINAL EXPOSURE N/A 09/20/2018   Procedure: ABDOMINAL EXPOSURE;  Surgeon: Angelia Mould, MD;  Location: [facility] OR;  Service: Vascular;  Laterality: N/A;  . ANTERIOR LUMBAR FUSION N/A 09/20/2018   Procedure: Lumbar 5 Sacral 1 Anterior lumbar interbody fusion with Lumbar 3 to Iliac fusion with pedicle screws/Lumbar 2-3 Decompressive laminectomy;  Surgeon: Earnie Larsson, MD;  Location: [facility];  Service: Neurosurgery;  Laterality: N/A;  Lumbar 5 Sacral 1 Anterior lumbar interbody fusion with Lumbar 3 to Iliac fusion with pedicle screws/Lumbar 2-3 Decompressive laminectomy  . APPLICATION OF ROBOTIC ASSISTANCE FOR SPINAL PROCEDURE N/A 09/20/2018   Procedure: APPLICATION OF ROBOTIC ASSISTANCE FOR SPINAL PROCEDURE;  Surgeon: Earnie Larsson, MD;  Location: [place];  Service: Neurosurgery;  Laterality: N/A;  . AV NODE ABLATION     09/16/07. SVT Dr. Caryl Comes  . BACK SURGERY  01/12/00 and 1982/ has had 6 surgeries on back  . COLONOSCOPY    . INGUINAL HERNIA REPAIR     Left  . LUMBAR WOUND DEBRIDEMENT N/A 09/26/2018   Procedure: LUMBAR WOUND DEBRIDEMENT Placement of lumbar drain;  Surgeon: Earnie Larsson, MD;  Location: [facility];  Service: Neurosurgery;  Laterality: N/A;  LUMBAR WOUND DEBRIDEMENT Placement of lumbar drain  . POSTERIOR LUMBAR FUSION 4 LEVEL N/A 09/20/2018   Procedure: POSTERIOR LUMBAR FUSION 4 LEVEL;  Surgeon: Earnie Larsson,  MD;  Location: [facility];  Service: Neurosurgery;  Laterality: N/A;  . REPLACEMENT TOTAL KNEE     1999, L. Murphy/wainer.   Marland Kitchen TOTAL HIP ARTHROPLASTY Right 12/07/2016   Procedure: TOTAL HIP ARTHROPLASTY ANTERIOR APPROACH;  Surgeon: Frederik Pear, MD;  Location: [facility];  Service: Orthopedics;  Laterality: Right;    Family History  Problem Relation Age of Onset  . Breast cancer Mother   . Diabetes Father   . Heart attack Father        late 62s  . Atrial fibrillation Father   . Liver disease Paternal Uncle     Medications- reviewed and updated Current Outpatient Medications  Medication Sig Dispense Refill  . acetaminophen (TYLENOL) 500 MG tablet Take 500 mg by mouth as needed.    . diphenhydrAMINE (BENADRYL) 25 MG tablet Take 25 mg by mouth at bedtime as needed for allergies.     Marland Kitchen esomeprazole (NEXIUM) 40 MG packet Take 40 mg by mouth daily before breakfast.    . lisinopril (ZESTRIL) 20 MG tablet Take 1 tablet (20 mg total) by mouth daily. 90 tablet 3  . Multiple Vitamin (MULTIVITAMIN WITH MINERALS) TABS tablet Take 1 tablet by mouth daily. Centrum Silver    . triamcinolone cream (KENALOG) 0.1 % Apply 1 application topically 2 (two) times daily. For 7-10 days maximum 80 g 0   No current facility-administered medications for this visit.    Allergies-reviewed and updated No Known Allergies  Social History   Social History Narrative   Single, divorced. 2 daughters. No grandkids.       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 Out 2001.       Primary caregiver for father who lives with him    Objective  Objective:  BP 122/78   Pulse (!) 101   Temp (!) 97.3 F (36.3 C) (Temporal)   Ht 5\' 10"  (1.778 m)   Wt 208 lb 12.8 oz (94.7 kg)   SpO2 95%   BMI 29.96 kg/m  Gen: NAD, resting comfortably CV: RRR no murmurs rubs or gallops Lungs: CTAB no crackles, wheeze, rhonchi Ext: no edema Skin: warm, dry    Assessment and Plan  54 y.o. male presenting for annual Health  Maintenance counseling: 1. Anticipatory guidance: Patient counseled regarding regular dental exams patient mentioned that it has been awhile since his last dental exam- encouraged follow up q6 months, eye exams patient mentioned that he hasn't had a recent eye exam- would encourage,  avoiding smoking and second hand smoke- unfortunately smoking , limiting alcohol to 2 beverages per day- about twelve a week- could cut down for weight loss .   2. Risk factor reduction:  Advised patient of need for regular exercise and diet rich and fruits and vegetables to reduce risk of heart attack and stroke. Exercise- limited by disability. Diet- weight down 7 lbs from December- trying to eat better.  Wt Readings from Last 3 Encounters:  11/07/19 208 lb 12.8 oz (94.7 kg)  09/28/19  211 lb 9.6 oz (96 kg)  08/07/19 215 lb (97.5 kg)  3. Immunizations/screenings/ancillary studies- was exposed to covid and was positive- he opts to hold off on vaccine at least for first 8 months and he is going to continue to research and consider options.  Immunization History  Administered Date(s) Administered  . Influenza Split 06/19/2011  . Influenza Whole 05/20/2009, 06/16/2010  . Influenza,inj,Quad PF,6+ Mos 08/01/2013, 07/03/2014, 06/21/2015, 06/16/2018  . Influenza-Unspecified 06/22/2016  . Pneumococcal Conjugate-13 07/03/2014  . Td 01/06/2010  4. Prostate cancer screening- forgot to do psa with labs- we agreed to do at next visit  Lab Results  Component Value Date   PSA 2.57 07/13/2016   5. Colon cancer screening - Patient is due in May 2023 for 5 year repeat 6. Skin cancer screening- has seen derm in the past [facility] dermatology. advised regular sunscreen use. Denies worrisome, changing, or new skin lesions.  7. Current smoker. Strongly encouraged cessation- saw dad with copd but still hard. Will get UA. 2.4 pack years- doesn't qualify for lung cancer screening  Status of chronic or acute concerns   #hyperlipidemia S:  poor control Lab Results  Component Value Date   CHOL 209 (H) 09/28/2019   HDL 45.50 09/28/2019   LDLCALC 81 07/13/2016   LDLDIRECT 139.0 09/28/2019   TRIG 249.0 (H) 09/28/2019   CHOLHDL 5 09/28/2019   A/P: poor control- we are going to focus on lifestyle changes- if develops diabetes firmly will have to start statin. Otherwise in 7.5-20% range we discussed can remain off medicine and focus on lifestyle. 6 pack years so doesn't qualify for lung cancer screening    # Hyperglycemia/insulin resistance/prediabetes S: Exercise and diet-  Trying to cut down on sugar intake- adding less sugar to tea and coffee. Activity limited by disability with his back. He has already lost 3 lbs from last visit.  Lab Results  Component Value Date   HGBA1C 7.0 (H) 09/28/2019   HGBA1C 5.4 07/13/2016   HGBA1C 6.0 12/11/2014   A/P:  We opted not to diagnose as diabetes unless 2 consecutive a1cs in a row of 6.5 or more- he is going to focus on dietary changes- see avs/youtube recommendations.    #hypertension S: compliant with lisinopril 20 mg and blood pressure is well controlled BP Readings from Last 3 Encounters:  11/07/19 122/78  09/28/19 (!) 160/100  10/07/18 132/80  A/P: Stable. Continue current medications.   #Rash S:got into some poison oak in the yard and has spot on left forearm that is itchy ROS-not ill appearing, no fever/chills. No new medications. Not immunocompromised. No mucus membrane involvement.  A/P: likely contact dermatitis- we will send in triamcinolone as on limited area  # allergic rhinitis S:shots years ago but has had intermittent issues. Sense of smell didn't come back after covid  A/P: recommended using claritin, allegra, or xyzal at night- also could use flonase during the daytime (has some but not using) . Could also try neti pot but he doesn't love putting thing sin his nose.   #sleep issues goes to bed at 9 AM and up at 3-4 Am. Has been going on for over a year. Has tried  melatonin  at 10 mg last few nights and helpful.  - we also discussed if continues to have issues could try trazodone 50mg - he will reach out if needed.   SVT/ PSVT/ PAT-as needed follow-up with cardiology-no recent issues with palpitations after svt ablation Dr. Caryl Comes  GERD-patient compliant with Nexium  40 mg daily. Takes MV with B12  No results found for: VITAMINB12   Recommended follow up: Return in about 6 months (around 05/09/2020) for follow up- or sooner if needed. No future appointments.  Chief Complaint  Patient presents with  . Annual Exam  . Rash    Patient mentioned that he was working in te yard last weekend and he now has 2 spots on posion ivy located on his right calf muscle and left forearm   Lab/Order associations: non fasting   ICD-10-CM   1. Preventative health care  Z00.00   2. Hyperlipidemia, unspecified hyperlipidemia type  E78.5   3. Hyperglycemia  R73.9   4. Essential hypertension  I10   5. SVT/ PSVT/ PAT  I47.1     Meds ordered this encounter  Medications  . triamcinolone cream (KENALOG) 0.1 %    Sig: Apply 1 application topically 2 (two) times daily. For 7-10 days maximum    Dispense:  80 g    Refill:  0    Return precautions advised.  Garret Reddish, MD

## 2019-11-07 NOTE — Addendum Note (Signed)
Addended by: Francis Dowse T on: 11/07/2019 03:43 PM   Modules accepted: Orders

## 2019-11-07 NOTE — Patient Instructions (Addendum)
Diabetes education-Get on youtube and access videos by Dr. Smiley Houseman (an endocrinologist). Listen to diabetes education parts 1-5  Also can search nutritionfacts.org under prediabetes  Also try triamcinolone for poison oak- if not better in 10 days would be reasonable to see dermatology   Has tried melatonin  at 10 mg last few nights and helpful.  - we also discussed if continues to have issues could try trazodone 50mg - he will reach out if needed.   Make sure to space at least 2 weeks from any covid vaccine Please check with your pharmacy to see if they have the shingrix vaccine. If they do- please get this immunization and update [place] by phone call or mychart with dates you receive the vaccine  Please stop by lab before you go- for urine If you do not have mychart- we will call you about results within 5 business days of [place] receiving them.  If you have mychart- we will send your results within 3 business days of [place] receiving them.  If abnormal or we want to clarify a result, we will call or mychart you to make sure you receive the message.  If you have questions or concerns or don't hear within 5 business days, please send [place] a message or call us.   Recommended follow up: Return in about 6 months (around 05/09/2020) for follow up- or sooner if needed.

## 2020-05-07 ENCOUNTER — Ambulatory Visit: Payer: Medicare Other | Admitting: Family Medicine

## 2020-05-07 NOTE — Progress Notes (Deleted)
Phone [phone] In person visit   Subjective:   [name] White. is a 54 y.o. year old very pleasant male patient who presents for/with See problem oriented charting No chief complaint on file.   This visit occurred during the SARS-CoV-2 public health emergency.  Safety protocols were in place, including screening questions prior to the visit, additional usage of staff PPE, and extensive cleaning of exam room while observing appropriate contact time as indicated for disinfecting solutions.   Past Medical History-  Patient Active Problem List   Diagnosis Date Noted  . Status post spinal surgery 09/20/2018  . Degenerative spondylolisthesis 09/20/2018  . Arthritis of right hip 12/07/2016  . Primary osteoarthritis of right hip 12/02/2016  . History of colon polyps 09/16/2016  . Genital warts 07/06/2016  . Osteoarthritis of right knee 12/04/2014  . Former smoker 12/04/2014  . Hyperglycemia 05/27/2012  . SVT/ PSVT/ PAT 06/16/2010  . SCOLIOSIS, THORACOLUMBAR 01/06/2010  . Hyperlipidemia 04/14/2007  . Essential hypertension 03/07/2007    Medications- reviewed and updated Current Outpatient Medications  Medication Sig Dispense Refill  . acetaminophen (TYLENOL) 500 MG tablet Take 500 mg by mouth as needed.    . diphenhydrAMINE (BENADRYL) 25 MG tablet Take 25 mg by mouth at bedtime as needed for allergies.     Marland Kitchen esomeprazole (NEXIUM) 40 MG packet Take 40 mg by mouth daily before breakfast.    . lisinopril (ZESTRIL) 20 MG tablet Take 1 tablet (20 mg total) by mouth daily. 90 tablet 3  . Multiple Vitamin (MULTIVITAMIN WITH MINERALS) TABS tablet Take 1 tablet by mouth daily. Centrum Silver    . triamcinolone cream (KENALOG) 0.1 % Apply 1 application topically 2 (two) times daily. For 7-10 days maximum 80 g 0   No current facility-administered medications for this visit.     Objective:  There were no vitals taken for this visit. Gen: NAD, resting comfortably CV: RRR no murmurs rubs  or gallops Lungs: CTAB no crackles, wheeze, rhonchi Abdomen: soft/nontender/nondistended/normal bowel sounds. No rebound or guarding.  Ext: no edema Skin: warm, dry Neuro: grossly normal, moves all extremities  ***    Assessment and Plan   #hyperlipidemia S: Medication:***  Lab Results  Component Value Date   CHOL 209 (H) 09/28/2019   HDL 45.50 09/28/2019   LDLCALC 81 07/13/2016   LDLDIRECT 139.0 09/28/2019   TRIG 249.0 (H) 09/28/2019   CHOLHDL 5 09/28/2019   A/P: ***  # Hyperglycemia/insulin resistance/prediabetes S:  Medication: *** Exercise and diet- *** Lab Results  Component Value Date   HGBA1C 7.0 (H) 09/28/2019   HGBA1C 5.4 07/13/2016   HGBA1C 6.0 12/11/2014    A/P: *** #hypertension S: medication: *** Home readings #s: *** BP Readings from Last 3 Encounters:  11/07/19 122/78  09/28/19 (!) 160/100  10/07/18 132/80  A/P: ***    Patient thinks developed possible immunity to Augmentin ***  ***AWV 05/01/2019  ***PSA next labs 6 months from 11/07/19 ***poc a1c next visit  No problem-specific Assessment & Plan notes found for this encounter.   Recommended follow up: ***No follow-ups on file. Future Appointments  Date Time Provider [place]  05/07/2020  2:40 PM Marin Olp, MD [facility]    Lab/Order associations: No diagnosis found.  No orders of the defined types were placed in this encounter.   Time Spent: *** minutes of total time (8:06 AM***- 8:06 AM***) was spent on the date of the encounter performing the following actions: chart review prior to seeing the  patient, obtaining history, performing a medically necessary exam, counseling on the treatment plan, placing orders, and documenting in our EHR.   Return precautions advised.  Clyde Lundborg, CMA

## 2020-05-27 ENCOUNTER — Ambulatory Visit: Payer: Medicare Other | Admitting: Family Medicine

## 2020-05-27 ENCOUNTER — Telehealth: Payer: Self-pay

## 2020-05-27 NOTE — Telephone Encounter (Signed)
Nurse Assessment Nurse: Carlis Abbott, RN, Estill Bamberg Date/Time (Eastern Time): 05/24/2020 3:05:54 PM Confirm and document reason for call. If symptomatic, describe symptoms. ---Caller states that he was bitten by a spider on Wednesday. Caller states it is sore, with discharge today. It is on his lower left hip. Denies fever, vomiting, or other sx. There have been spiders around him lately. Does the patient have any new or worsening symptoms? ---Yes Will a triage be completed? ---Yes Related visit to physician within the last 2 weeks? ---No Does the PT have any chronic conditions? (i.e. diabetes, asthma, this includes High risk factors for pregnancy, etc.) ---Yes List chronic conditions. ---HTN Is this a behavioral health or substance abuse call? ---No Guidelines Guideline Title Affirmed Question Affirmed Notes Nurse Date/Time (Eastern Time) Spider Bite - [place] Last tetanus shot > 10 years ago Carlis Abbott, Cytogeneticist 05/24/2020 3:08:56 PM Disp. Time Eilene Ghazi Time) Disposition Final User 05/24/2020 3:16:30 PM SEE PCP WITHIN 3 DAYS Yes Carlis Abbott, RN, Shelly Coss Disagree/Comply Comply Caller Understands Yes PLEASE NOTE: All timestamps contained within this report are represented as [place] Standard Time. CONFIDENTIALTY NOTICE: This fax transmission is intended only for the addressee. It contains information that is legally privileged, confidential or otherwise protected from use or disclosure. If you are not the intended recipient, you are strictly prohibited from reviewing, disclosing, copying using or disseminating any of this information or taking any action in reliance on or regarding this information. If you have received this fax in error, please notify us immediately by telephone so that we can arrange for its return to [place]. Phone: [phone], Toll-Free: [phone], Fax: [phone] Page: 2 of 2 Call Id: [id number] PreDisposition Go to Urgent Care/Walk-In Clinic Care Advice Given Per  Guideline SEE PCP WITHIN 3 DAYS: * You need to be seen within 2 or 3 days. * PCP VISIT: Call your doctor (or NP/PA) during regular office hours and make an appointment. A clinic or urgent care center are good places to go for care if your doctor's office is closed or you can't get an appointment. NOTE: If office will be open tomorrow, tell caller to call then, not in 3 days. TETANUS SHOT: * You should get a tetanus booster shot in the next 3 days. PAIN MEDICINES: * For pain relief, you can take either acetaminophen, ibuprofen, or naproxen. * Before taking any medicine, read all the instructions on the package. ANTIHISTAMINE MEDICINES - EXTRA NOTES AND WARNINGS: * Diphenhydramine (Benadryl) is a FIRST GENERATION ANTIHISTAMINE medicine. It causes more sleepiness than the newer second generation antihistamine medicines. The adult dosage of Benadryl is 25 to 50 mg by mouth and you can take it up to 4 times a day. * Before taking any medicine, read all the instructions on the package. EXPECTED COURSE: * Some swelling and pain for 1 to 2 days. * It shouldn't be any worse than a bee sting. CALL BACK IF: * Severe bite pain persists over 2 hours after pain medicine * Abdominal pain or muscle spasms occur * Bite begins to look infected * Your urine becomes very dark (brown, red) * Local pain lasts over 2 days (48 hours) * You become worse CARE ADVICE given per Spider Bite - [place] (Adult) guideline. Referrals REFERRED TO PCP OFFIC

## 2020-05-27 NOTE — Telephone Encounter (Signed)
Pt scheduled for 06/04/20.

## 2020-05-31 ENCOUNTER — Ambulatory Visit (INDEPENDENT_AMBULATORY_CARE_PROVIDER_SITE_OTHER): Payer: Medicare Other

## 2020-05-31 ENCOUNTER — Other Ambulatory Visit: Payer: Self-pay

## 2020-05-31 DIAGNOSIS — Z0000 Encounter for general adult medical examination without abnormal findings: Secondary | ICD-10-CM | POA: Diagnosis not present

## 2020-05-31 NOTE — Patient Instructions (Addendum)
[name] White , Thank you for taking time to come for your Medicare Wellness Visit. I appreciate your ongoing commitment to your health goals. Please review the following plan we discussed and let me know if I can assist you in the future.   Screening recommendations/referrals: Colonoscopy: Done 09/08/16 Recommended yearly ophthalmology/optometry visit for glaucoma screening and checkup Recommended yearly dental visit for hygiene and checkup  Vaccinations: Influenza vaccine: Declined and discussed Pneumococcal vaccine: Done PCV 13 07/03/14 Tdap vaccine: Due and discussed Shingles vaccine: Shingrix discussed. Please contact your pharmacy for coverage information.    Covid-19: Declined and discussed  Advanced directives: Advance directive discussed with you today. Even though you declined this today please call our office should you change your mind and we can give you the proper paperwork for you to fill out.  Conditions/risks identified: Non at this time  Next appointment: Follow up in one year for your annual wellness visit   Preventive Care 40-64 Years, Male Preventive care refers to lifestyle choices and visits with your health care provider that can promote health and wellness. What does preventive care include?  A yearly physical exam. This is also called an annual well check.  Dental exams once or twice a year.  Routine eye exams. Ask your health care provider how often you should have your eyes checked.  Personal lifestyle choices, including:  Daily care of your teeth and gums.  Regular physical activity.  Eating a healthy diet.  Avoiding tobacco and drug use.  Limiting alcohol use.  Practicing safe sex.  Taking low-dose aspirin every day starting at age 32. What happens during an annual well check? The services and screenings done by your health care provider during your annual well check will depend on your age, overall health, lifestyle risk factors, and family  history of disease. Counseling  Your health care provider may ask you questions about your:  Alcohol use.  Tobacco use.  Drug use.  Emotional well-being.  Home and relationship well-being.  Sexual activity.  Eating habits.  Work and work Statistician. Screening  You may have the following tests or measurements:  Height, weight, and BMI.  Blood pressure.  Lipid and cholesterol levels. These may be checked every 5 years, or more frequently if you are over 46 years old.  Skin check.  Lung cancer screening. You may have this screening every year starting at age 17 if you have a 30-pack-year history of smoking and currently smoke or have quit within the past 15 years.  Fecal occult blood test (FOBT) of the stool. You may have this test every year starting at age 84.  Flexible sigmoidoscopy or colonoscopy. You may have a sigmoidoscopy every 5 years or a colonoscopy every 10 years starting at age 27.  Prostate cancer screening. Recommendations will vary depending on your family history and other risks.  Hepatitis C blood test.  Hepatitis B blood test.  Sexually transmitted disease (STD) testing.  Diabetes screening. This is done by checking your blood sugar (glucose) after you have not eaten for a while (fasting). You may have this done every 1-3 years. Discuss your test results, treatment options, and if necessary, the need for more tests with your health care provider. Vaccines  Your health care provider may recommend certain vaccines, such as:  Influenza vaccine. This is recommended every year.  Tetanus, diphtheria, and acellular pertussis (Tdap, Td) vaccine. You may need a Td booster every 10 years.  Zoster vaccine. You may need this after age 19.  Pneumococcal 13-valent conjugate (PCV13) vaccine. You may need this if you have certain conditions and have not been vaccinated.  Pneumococcal polysaccharide (PPSV23) vaccine. You may need one or two doses if you smoke  cigarettes or if you have certain conditions. Talk to your health care provider about which screenings and vaccines you need and how often you need them. This information is not intended to replace advice given to you by your health care provider. Make sure you discuss any questions you have with your health care provider. Document Released: 08/30/2015 Document Revised: 04/22/2016 Document Reviewed: 06/04/2015 Elsevier Interactive Patient Education  2017 [employer] Prevention in the Home Falls can cause injuries. They can happen to people of all ages. There are many things you can do to make your home safe and to help prevent falls. What can I do on the outside of my home?  Regularly fix the edges of walkways and driveways and fix any cracks.  Remove anything that might make you trip as you walk through a door, such as a raised step or threshold.  Trim any bushes or trees on the path to your home.  Use bright outdoor lighting.  Clear any walking paths of anything that might make someone trip, such as rocks or tools.  Regularly check to see if handrails are loose or broken. Make sure that both sides of any steps have handrails.  Any raised decks and porches should have guardrails on the edges.  Have any leaves, snow, or ice cleared regularly.  Use sand or salt on walking paths during winter.  Clean up any spills in your garage right away. This includes oil or grease spills. What can I do in the bathroom?  Use night lights.  Install grab bars by the toilet and in the tub and shower. Do not use towel bars as grab bars.  Use non-skid mats or decals in the tub or shower.  If you need to sit down in the shower, use a plastic, non-slip stool.  Keep the floor dry. Clean up any water that spills on the floor as soon as it happens.  Remove soap buildup in the tub or shower regularly.  Attach bath mats securely with double-sided non-slip rug tape.  Do not have throw rugs  and other things on the floor that can make you trip. What can I do in the bedroom?  Use night lights.  Make sure that you have a light by your bed that is easy to reach.  Do not use any sheets or blankets that are too big for your bed. They should not hang down onto the floor.  Have a firm chair that has side arms. You can use this for support while you get dressed.  Do not have throw rugs and other things on the floor that can make you trip. What can I do in the kitchen?  Clean up any spills right away.  Avoid walking on wet floors.  Keep items that you use a lot in easy-to-reach places.  If you need to reach something above you, use a strong step stool that has a grab bar.  Keep electrical cords out of the way.  Do not use floor polish or wax that makes floors slippery. If you must use wax, use non-skid floor wax.  Do not have throw rugs and other things on the floor that can make you trip. What can I do with my stairs?  Do not leave any items on the  stairs.  Make sure that there are handrails on both sides of the stairs and use them. Fix handrails that are broken or loose. Make sure that handrails are as long as the stairways.  Check any carpeting to make sure that it is firmly attached to the stairs. Fix any carpet that is loose or worn.  Avoid having throw rugs at the top or bottom of the stairs. If you do have throw rugs, attach them to the floor with carpet tape.  Make sure that you have a light switch at the top of the stairs and the bottom of the stairs. If you do not have them, ask someone to add them for you. What else can I do to help prevent falls?  Wear shoes that:  Do not have high heels.  Have rubber bottoms.  Are comfortable and fit you well.  Are closed at the toe. Do not wear sandals.  If you use a stepladder:  Make sure that it is fully opened. Do not climb a closed stepladder.  Make sure that both sides of the stepladder are locked into  place.  Ask someone to hold it for you, if possible.  Clearly mark and make sure that you can see:  Any grab bars or handrails.  First and last steps.  Where the edge of each step is.  Use tools that help you move around (mobility aids) if they are needed. These include:  Canes.  Walkers.  Scooters.  Crutches.  Turn on the lights when you go into a dark area. Replace any light bulbs as soon as they burn out.  Set up your furniture so you have a clear path. Avoid moving your furniture around.  If any of your floors are uneven, fix them.  If there are any pets around you, be aware of where they are.  Review your medicines with your doctor. Some medicines can make you feel dizzy. This can increase your chance of falling. Ask your doctor what other things that you can do to help prevent falls. This information is not intended to replace advice given to you by your health care provider. Make sure you discuss any questions you have with your health care provider. Document Released: 05/30/2009 Document Revised: 01/09/2016 Document Reviewed: 09/07/2014 Elsevier Interactive Patient Education  2017 [employer].

## 2020-05-31 NOTE — Progress Notes (Signed)
Virtual Visit via Telephone Note  I connected with  [name] White. on 05/31/20 at 11:45 AM EDT by telephone and verified that I am speaking with the correct person using two identifiers.  Medicare Annual Wellness visit completed telephonically due to Covid-19 pandemic.   Persons participating in this call: This Health Coach and this patient.   Location: Patient: Home Provider: Office   I discussed the limitations, risks, security and privacy concerns of performing an evaluation and management service by telephone and the availability of in person appointments. The patient expressed understanding and agreed to proceed.  Unable to perform video visit due to video visit attempted and failed and/or patient does not have video capability.   Some vital signs may be absent or patient reported.   Willette Brace, LPN    Subjective:   [name] White. is a 54 y.o. male who presents for Medicare Annual/Subsequent preventive examination.  Review of Systems     Cardiac Risk Factors include: dyslipidemia;male gender     Objective:    There were no vitals filed for this visit. There is no height or weight on file to calculate BMI.  Advanced Directives 05/31/2020 05/01/2019 09/20/2018 09/12/2018 09/07/2018 08/03/2018 12/08/2016  Does Patient Have a Medical Advance Directive? No No No No No No No  Would patient like information on creating a medical advance directive? No - Patient declined No - Patient declined No - Patient declined No - Patient declined - - No - Patient declined    Current Medications (verified) Outpatient Encounter Medications as of 05/31/2020  Medication Sig  . acetaminophen (TYLENOL) 500 MG tablet Take 500 mg by mouth as needed.  . diphenhydrAMINE (BENADRYL) 25 MG tablet Take 25 mg by mouth at bedtime as needed for allergies.   Marland Kitchen esomeprazole (NEXIUM) 40 MG packet Take 40 mg by mouth daily before breakfast.  . lisinopril (ZESTRIL) 20 MG tablet Take 1 tablet (20 mg total)  by mouth daily.  . Multiple Vitamin (MULTIVITAMIN WITH MINERALS) TABS tablet Take 1 tablet by mouth daily. Centrum Silver  . triamcinolone cream (KENALOG) 0.1 % Apply 1 application topically 2 (two) times daily. For 7-10 days maximum (Patient not taking: Reported on 05/31/2020)   No facility-administered encounter medications on file as of 05/31/2020.    Allergies (verified) Patient has no known allergies.   History: Past Medical History:  Diagnosis Date  . AF (atrial fibrillation) ([place]) 2009   nonsustained  . Arthritis   . Blood transfusion without reported diagnosis    1960's/ as baby  . Cardiomyopathy secondary 2009   had ablation  . Chronic pain syndrome    back pain  . Dysrhythmia    states no longer a problem  . Heart murmur    states no longer there  . History of kidney stones   . Hyperlipidemia   . Hypertension   . Inguinal hernia    left  . Nephrolithiasis    2 kidney stones  . PSVT (paroxysmal supraventricular tachycardia) ([place])    had ablation  . Scoliosis    severe/ has a lot metal in back per pt/    Past Surgical History:  Procedure Laterality Date  . ABDOMINAL EXPOSURE N/A 09/20/2018   Procedure: ABDOMINAL EXPOSURE;  Surgeon: Angelia Mould, MD;  [facility];  Service: Vascular;  Laterality: N/A;  . ANTERIOR LUMBAR FUSION N/A 09/20/2018   Procedure: Lumbar 5 Sacral 1 Anterior lumbar interbody fusion with Lumbar 3 to Iliac fusion with  pedicle screws/Lumbar 2-3 Decompressive laminectomy;  Surgeon: Earnie Larsson, MD;  Location: [place];  Service: Neurosurgery;  Laterality: N/A;  Lumbar 5 Sacral 1 Anterior lumbar interbody fusion with Lumbar 3 to Iliac fusion with pedicle screws/Lumbar 2-3 Decompressive laminectomy  . APPLICATION OF ROBOTIC ASSISTANCE FOR SPINAL PROCEDURE N/A 09/20/2018   Procedure: APPLICATION OF ROBOTIC ASSISTANCE FOR SPINAL PROCEDURE;  Surgeon: Earnie Larsson, MD;  Location: [place];  Service: Neurosurgery;  Laterality: N/A;  . AV NODE ABLATION      09/16/07. SVT Dr. Caryl Comes  . BACK SURGERY     01/12/00 and 1982/ has had 6 surgeries on back  . COLONOSCOPY    . INGUINAL HERNIA REPAIR     Left  . LUMBAR WOUND DEBRIDEMENT N/A 09/26/2018   Procedure: LUMBAR WOUND DEBRIDEMENT Placement of lumbar drain;  Surgeon: Earnie Larsson, MD;  Location: [place];  Service: Neurosurgery;  Laterality: N/A;  LUMBAR WOUND DEBRIDEMENT Placement of lumbar drain  . POSTERIOR LUMBAR FUSION 4 LEVEL N/A 09/20/2018   Procedure: POSTERIOR LUMBAR FUSION 4 LEVEL;  Surgeon: Earnie Larsson, MD;  Location: [place];  Service: Neurosurgery;  Laterality: N/A;  . REPLACEMENT TOTAL KNEE     1999, L. Murphy/wainer.   Marland Kitchen TOTAL HIP ARTHROPLASTY Right 12/07/2016   Procedure: TOTAL HIP ARTHROPLASTY ANTERIOR APPROACH;  Surgeon: Frederik Pear, MD;  Location: [place];  Service: Orthopedics;  Laterality: Right;   Family History  Problem Relation Age of Onset  . Breast cancer Mother   . Diabetes Father   . Heart attack Father        late 48s  . Atrial fibrillation Father   . Liver disease Paternal Uncle    Social History   Socioeconomic History  . Marital status: Divorced    Spouse name: Not on file  . Number of children: Not on file  . Years of education: Not on file  . Highest education level: Not on file  Occupational History  . Occupation: disabled   Tobacco Use  . Smoking status: Former Smoker    Packs/day: 0.30    Years: 8.00    Pack years: 2.40    Types: Cigarettes    Quit date: 08/18/2011    Years since quitting: 8.7  . Smokeless tobacco: Never Used  Vaping Use  . Vaping Use: Never used  Substance and Sexual Activity  . Alcohol use: Yes    Alcohol/week: 12.0 standard drinks    Types: 12 Cans of beer per week    Comment: weekly  . 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 Sexual activity: Not on file  Other Topics Concern  . Not on file  Social History Narrative   Single, divorced. 2 daughters. No grandkids.       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       Primary caregiver for father who lives with him    Social Determinants of Health   Financial Resource Strain: Low Risk   . Difficulty of Paying Living Expenses: Not hard at all  Food Insecurity: No Food Insecurity  . Worried About Charity fundraiser in the Last Year: Never true  . Ran Out of Food in the Last Year: Never true  Transportation Needs: No Transportation Needs  . Lack of Transportation (Medical): No  . Lack of Transportation (Non-Medical): No  Physical Activity: Inactive  . Days of Exercise per Week: 0 days  . Minutes of Exercise per Session: 0  min  Stress: No Stress Concern Present  . Feeling of Stress : Not at all  Social Connections: Socially Isolated  . Frequency of Communication with Friends and Family: More than three times a week  . Frequency of Social Gatherings with Friends and Family: More than three times a week  . Attends Religious Services: Never  . Active Member of Clubs or Organizations: No  . Attends Archivist Meetings: Never  . Marital Status: Divorced    Tobacco Counseling Counseling given: Not Answered   Clinical Intake:  Pre-visit preparation completed: Yes  Pain : No/denies pain     BMI - recorded: 29.96 Nutritional Status: BMI 25 -29 Overweight Nutritional Risks: None Diabetes: No  How often do you need to have someone help you when you read instructions, pamphlets, or other written materials from your doctor or pharmacy?: 1 - Never  Diabetic?No  Interpreter Needed?: No  Information entered by :: Charlott Rakes, LPN   Activities of Daily Living In your present state of health, do you have any difficulty performing the following activities: 05/31/2020  Hearing? N  Vision? N  Difficulty concentrating or making decisions? N  Walking or climbing stairs? N  Dressing or bathing? N  Doing errands, shopping? N  Preparing Food and eating ? N  Using the Toilet? N  In the past six months,  have you accidently leaked urine? N  Do you have problems with loss of bowel control? N  Managing your Medications? N  Managing your Finances? N  Housekeeping or managing your Housekeeping? N  Some recent data might be hidden    Patient Care Team: Marin Olp, MD as PCP - General (Family Medicine) Earnie Larsson, MD as Consulting Physician (Neurosurgery) Angelia Mould, MD as Consulting Physician (Vascular Surgery)  Indicate any recent Medical Services you may have received from other than [facility] providers in the past year (date may be approximate).     Assessment:   This is a routine wellness examination for [place].  Hearing/Vision screen  Hearing Screening   125Hz  250Hz  500Hz  1000Hz  2000Hz  3000Hz  4000Hz  6000Hz  8000Hz   Right ear:           Left ear:           Comments: Pt denies any difficulty hearing at this time  Vision Screening Comments: Pt doesn't follow up annually with eye exams at this time  Dietary issues and exercise activities discussed: Current Exercise Habits: The patient does not participate in regular exercise at present  Goals    . Patient Stated     None at this time      Depression Screen PHQ 2/9 Scores 05/31/2020 11/07/2019 05/01/2019 10/13/2017  PHQ - 2 Score 0 0 0 0  PHQ- 9 Score - 1 - -    Fall Risk Fall Risk  05/31/2020 09/28/2019 05/01/2019 10/13/2017  Falls in the past year? 0 0 0 No  Number falls in past yr: 0 0 0 -  Injury with Fall? 0 0 0 -  Follow up Falls prevention discussed - Falls evaluation completed -       ASSISTIVE DEVICES UTILIZED TO PREVENT FALLS:  Life alert? No  Use of a cane, walker or w/c? No  Grab bars in the bathroom? No  Shower chair or bench in shower? No  Elevated toilet seat or a handicapped toilet? No   TIMED UP AND GO:  Was the test performed? No .      Cognitive Function:  6CIT Screen 05/31/2020  What Year? 0 points  What month? 0 points  Count back from 20 0 points  Months in reverse 0  points  Repeat phrase 0 points    Immunizations Immunization History  Administered Date(s) Administered  . Influenza Split 06/19/2011  . Influenza Whole 05/20/2009, 06/16/2010  . Influenza,inj,Quad PF,6+ Mos 08/01/2013, 07/03/2014, 06/21/2015, 06/16/2018  . Influenza-Unspecified 06/22/2016  . Pneumococcal Conjugate-13 07/03/2014  . Td 01/06/2010    TDAP status: Due, Education has been provided regarding the importance of this vaccine. Advised may receive this vaccine at local pharmacy or Health Dept. Aware to provide a copy of the vaccination record if obtained from local pharmacy or Health Dept. Verbalized acceptance and understanding. Flu Vaccine status: Declined, Education has been provided regarding the importance of this vaccine but patient still declined. Advised may receive this vaccine at local pharmacy or Health Dept. Aware to provide a copy of the vaccination record if obtained from local pharmacy or Health Dept. Verbalized acceptance and understanding. Pneumococcal vaccine status: Up to date Covid-19 vaccine status: Declined, Education has been provided regarding the importance of this vaccine but patient still declined. Advised may receive this vaccine at local pharmacy or Health Dept.or vaccine clinic. Aware to provide a copy of the vaccination record if obtained from local pharmacy or Health Dept. Verbalized acceptance and understanding.  Qualifies for Shingles Vaccine? Yes   Zostavax completed No   Shingrix Completed?: No.    Education has been provided regarding the importance of this vaccine. Patient has been advised to call insurance company to determine out of pocket expense if they have not yet received this vaccine. Advised may also receive vaccine at local pharmacy or Health Dept. Verbalized acceptance and understanding.  Screening Tests Health Maintenance  Topic Date Due  . COVID-19 Vaccine (1) Never done  . TETANUS/TDAP  01/07/2020  . INFLUENZA VACCINE  03/17/2020   . COLONOSCOPY  09/08/2021  . Hepatitis C Screening  Completed  . HIV Screening  Completed    Health Maintenance  Health Maintenance Due  Topic Date Due  . COVID-19 Vaccine (1) Never done  . TETANUS/TDAP  01/07/2020  . INFLUENZA VACCINE  03/17/2020    Colorectal cancer screening: Completed 09/08/16. Repeat every 5 years   Additional Screening:  Hepatitis C Screening: Completed 07/13/16  Vision Screening: Recommended annual ophthalmology exams for early detection of glaucoma and other disorders of the eye. Is the patient up to date with their annual eye exam?  No  Who is the provider or what is the name of the office in which the patient attends annual eye exams? Declines at this time   Dental Screening: Recommended annual dental exams for proper oral hygiene  Community Resource Referral / Chronic Care Management: CRR required this visit?  No   CCM required this visit?  No      Plan:     I have personally reviewed and noted the following in the patient's chart:   . Medical and social history . Use of alcohol, tobacco or illicit drugs  . Current medications and supplements . Functional ability and status . Nutritional status . Physical activity . Advanced directives . List of other physicians . Hospitalizations, surgeries, and ER visits in previous 12 months . Vitals . Screenings to include cognitive, depression, and falls . Referrals and appointments  In addition, I have reviewed and discussed with patient certain preventive protocols, quality metrics, and best practice recommendations. A written personalized care plan for preventive services as  well as general preventive health recommendations were provided to patient.     Willette Brace, LPN   [id number]   Nurse Notes: None

## 2020-06-03 NOTE — Patient Instructions (Addendum)
Health Maintenance Due  Topic Date Due  . COVID-19 Vaccine - undecided for now- did survive covid December 2020 thankfully Never done  . TETANUS/TDAP - Td today under spider bite low back diagnosis listed 01/07/2020  - declines flu shot for now  Please stop by lab before you go If you have mychart- we will send your results within 3 business days of [place] receiving them.  If you do not have mychart- we will call you about results within 5 business days of [place] receiving them.  *please note we are currently using [facility] which has a longer processing time than [facility] typically so labs may not come back as quickly as in the past *please also note that you will see labs on mychart as soon as they post. I will later go in and write notes on them- will say "notes from Dr. Yong Channel"

## 2020-06-03 NOTE — Progress Notes (Signed)
Phone [phone] In person visit   Subjective:   [name] White. is a 54 y.o. year old very pleasant male patient who presents for/with See problem oriented charting Chief Complaint  Patient presents with  . Hyperlipidemia  . Hypertension   This visit occurred during the SARS-CoV-2 public health emergency.  Safety protocols were in place, including screening questions prior to the visit, additional usage of staff PPE, and extensive cleaning of exam room while observing appropriate contact time as indicated for disinfecting solutions.   Past Medical History-  Patient Active Problem List   Diagnosis Date Noted  . Status post spinal surgery 09/20/2018    Priority: High  . SVT/ PSVT/ PAT 06/16/2010    Priority: High  . History of colon polyps 09/16/2016    Priority: Medium  . Genital warts 07/06/2016    Priority: Medium  . Hyperglycemia 05/27/2012    Priority: Medium  . SCOLIOSIS, THORACOLUMBAR 01/06/2010    Priority: Medium  . Hyperlipidemia 04/14/2007    Priority: Medium  . Essential hypertension 03/07/2007    Priority: Medium  . Degenerative spondylolisthesis 09/20/2018    Priority: Low  . Arthritis of right hip 12/07/2016    Priority: Low  . Primary osteoarthritis of right hip 12/02/2016    Priority: Low  . Osteoarthritis of right knee 12/04/2014    Priority: Low  . Former smoker 12/04/2014    Priority: Low    Medications- reviewed and updated Current Outpatient Medications  Medication Sig Dispense Refill  . acetaminophen (TYLENOL) 500 MG tablet Take 500 mg by mouth as needed.    . diphenhydrAMINE (BENADRYL) 25 MG tablet Take 25 mg by mouth at bedtime as needed for allergies.     Marland Kitchen esomeprazole (NEXIUM) 40 MG packet Take 40 mg by mouth daily before breakfast.    . lisinopril (ZESTRIL) 20 MG tablet Take 1 tablet (20 mg total) by mouth daily. 90 tablet 3  . Multiple Vitamin (MULTIVITAMIN WITH MINERALS) TABS tablet Take 1 tablet by mouth daily. Centrum Silver    .  triamcinolone cream (KENALOG) 0.1 % Apply 1 application topically 2 (two) times daily. For 7-10 days maximum (Patient not taking: Reported on 05/31/2020) 80 g 0   No current facility-administered medications for this visit.     Objective:  BP 132/80   Pulse (!) 102   Temp 99.4 F (37.4 C) (Temporal)   Resp 18   Ht 5\' 10"  (1.778 m)   Wt 211 lb 3.2 oz (95.8 kg)   SpO2 96%   BMI 30.30 kg/m  Gen: NAD, resting comfortably CV: RRR no murmurs rubs or gallops Lungs: CTAB no crackles, wheeze, rhonchi Ext: no edema Skin: warm, dry     Assessment and Plan   # spider bite last week left hip/left low back practically gone at this point from a week ago. Overdue for tetanus shot- willing to get this today Immunization History  Administered Date(s) Administered  . Influenza Split 06/19/2011  . Influenza Whole 05/20/2009, 06/16/2010  . Influenza,inj,Quad PF,6+ Mos 08/01/2013, 07/03/2014, 06/21/2015, 06/16/2018  . Influenza-Unspecified 06/22/2016  . Pneumococcal Conjugate-13 07/03/2014  . Td 01/06/2010  - declines flu and covid vaccine for now   #hypertension S: medication: lisinopril 20mg  BP Readings from Last 3 Encounters:  06/04/20 132/80  11/07/19 122/78  09/28/19 (!) 160/100  A/P: Stable. Continue current medications.   #hyperlipidemia S: Medication: none. Has wanted to work on diet changes. Exercise limited by his back.  Lab Results  Component Value Date  CHOL 209 (H) 09/28/2019   HDL 45.50 09/28/2019   LDLCALC 81 07/13/2016   LDLDIRECT 139.0 09/28/2019   TRIG 249.0 (H) 09/28/2019   CHOLHDL 5 09/28/2019   A/P: weight stable from February- he states had gotten up to 217 and is now losing down to 211 with watching portion sizes. Encouraged continued healthy changes 0 fruits/veggies/few processed foods. Repeat lipids in 6 months. He wants to target weight of 190. This will also help with prediabetes/possible diabetes  # Hyperglycemia/insulin resistance/prediabetes S:   Medication: none Exercise and diet- limited. Diet see above Lab Results  Component Value Date   HGBA1C 7.0 (H) 09/28/2019   HGBA1C 5.4 07/13/2016   HGBA1C 6.0 12/11/2014   A/P: hopefully has not progressed to diabetes if a1c remains 6.5 or above will need to diagnose as diabetes. Had preferred not to use metformin. Continue to work on lifestyle changes  Has not been screened for prostate cancer recently- we will update psa with labs Lab Results  Component Value Date   PSA 2.57 07/13/2016   #encouraged him to follow up with Dr. Caryl Comes- with HR continuing to be right around 100. Prior history of SVT  Recommended follow up: Return in about 6 months (around 12/03/2020) for follow up- or sooner if needed as long as a1c 7 or less. Future Appointments  Date Time Provider [place]  06/06/2021 11:45 AM [facility] COACH [facility]   Lab/Order associations:   ICD-10-CM   1. Essential hypertension  I10   2. Hyperglycemia  R73.9   3. Hyperlipidemia, unspecified hyperlipidemia type  E78.5   4. Screening PSA (prostate specific antigen)  Z12.5 PSA  5. Insect bite of lower back, initial encounter  S30.860A    W57.XXXA     No orders of the defined types were placed in this encounter.  Return precautions advised.  Garret Reddish, MD

## 2020-06-04 ENCOUNTER — Other Ambulatory Visit: Payer: Self-pay

## 2020-06-04 ENCOUNTER — Ambulatory Visit (INDEPENDENT_AMBULATORY_CARE_PROVIDER_SITE_OTHER): Payer: Medicare Other | Admitting: Family Medicine

## 2020-06-04 ENCOUNTER — Encounter: Payer: Self-pay | Admitting: Family Medicine

## 2020-06-04 VITALS — BP 132/80 | HR 102 | Temp 99.4°F | Resp 18 | Ht 70.0 in | Wt 211.2 lb

## 2020-06-04 DIAGNOSIS — Z125 Encounter for screening for malignant neoplasm of prostate: Secondary | ICD-10-CM

## 2020-06-04 DIAGNOSIS — Z23 Encounter for immunization: Secondary | ICD-10-CM

## 2020-06-04 DIAGNOSIS — R351 Nocturia: Secondary | ICD-10-CM

## 2020-06-04 DIAGNOSIS — R739 Hyperglycemia, unspecified: Secondary | ICD-10-CM | POA: Diagnosis not present

## 2020-06-04 DIAGNOSIS — W57XXXA Bitten or stung by nonvenomous insect and other nonvenomous arthropods, initial encounter: Secondary | ICD-10-CM

## 2020-06-04 DIAGNOSIS — I10 Essential (primary) hypertension: Secondary | ICD-10-CM

## 2020-06-04 DIAGNOSIS — E785 Hyperlipidemia, unspecified: Secondary | ICD-10-CM

## 2020-06-04 DIAGNOSIS — S30860A Insect bite (nonvenomous) of lower back and pelvis, initial encounter: Secondary | ICD-10-CM | POA: Diagnosis not present

## 2020-06-04 NOTE — Addendum Note (Signed)
Addended by: Thomes Cake on: 06/04/2020 01:36 PM   Modules accepted: Orders

## 2020-06-05 LAB — HEMOGLOBIN A1C
Hgb A1c MFr Bld: 7.3 % of total Hgb — ABNORMAL HIGH (ref <5.7)
Mean Plasma Glucose: 163 (calc)
eAG (mmol/L): 9 (calc)

## 2020-06-05 LAB — PSA: PSA: 1.76 ng/mL (ref <=4.0)

## 2020-06-24 ENCOUNTER — Other Ambulatory Visit: Payer: Self-pay

## 2020-06-24 ENCOUNTER — Ambulatory Visit (INDEPENDENT_AMBULATORY_CARE_PROVIDER_SITE_OTHER): Payer: Medicare Other | Admitting: Family Medicine

## 2020-06-24 ENCOUNTER — Encounter: Payer: Self-pay | Admitting: Family Medicine

## 2020-06-24 VITALS — BP 124/80 | HR 88 | Temp 97.9°F | Resp 18 | Ht 70.0 in | Wt 206.2 lb

## 2020-06-24 DIAGNOSIS — I10 Essential (primary) hypertension: Secondary | ICD-10-CM | POA: Diagnosis not present

## 2020-06-24 DIAGNOSIS — Z23 Encounter for immunization: Secondary | ICD-10-CM | POA: Diagnosis not present

## 2020-06-24 DIAGNOSIS — E663 Overweight: Secondary | ICD-10-CM

## 2020-06-24 DIAGNOSIS — E119 Type 2 diabetes mellitus without complications: Secondary | ICD-10-CM

## 2020-06-24 NOTE — Patient Instructions (Addendum)
Health Maintenance Due  Topic Date Due  . COVID-19 Vaccine (1) Has not received his vaccine yet. Never done   . PNEUMOCOCCAL POLYSACCHARIDE VACCINE AGE 56-64 HIGH RISK -  Today pneumovax 23  Never done  . OPHTHALMOLOGY EXAM - We will call you within two weeks about your referral to eye doctor. If you do not hear within 3 weeks, give [place] a call.   Never done   Diabetes education-Get on youtube and access videos by Dr. Smiley Houseman (an endocrinologist. Listen to diabetes education parts 1-5)  You may find this book helpful for your diabetes. It is available on [place] and is fairly inexpensive: Dr. Janene Harvey Program for Reversing Diabetes: The Scientifically Proven System for Reversing Diabetes without Drugs

## 2020-06-24 NOTE — Progress Notes (Signed)
Phone [phone] In person visit   Subjective:   [name] White. is a 54 y.o. year old very pleasant male patient who presents for/with See problem oriented charting Chief Complaint  Patient presents with  . Discuss medication   This visit occurred during the SARS-CoV-2 public health emergency.  Safety protocols were in place, including screening questions prior to the visit, additional usage of staff PPE, and extensive cleaning of exam room while observing appropriate contact time as indicated for disinfecting solutions.   Past Medical History-  Patient Active Problem List   Diagnosis Date Noted  . Status post spinal surgery 09/20/2018    Priority: High  . Diabetes mellitus type II, controlled ([place]) 05/27/2012    Priority: High  . SVT/ PSVT/ PAT 06/16/2010    Priority: High  . History of colon polyps 09/16/2016    Priority: Medium  . Genital warts 07/06/2016    Priority: Medium  . SCOLIOSIS, THORACOLUMBAR 01/06/2010    Priority: Medium  . Hyperlipidemia 04/14/2007    Priority: Medium  . Essential hypertension 03/07/2007    Priority: Medium  . Degenerative spondylolisthesis 09/20/2018    Priority: Low  . Arthritis of right hip 12/07/2016    Priority: Low  . Primary osteoarthritis of right hip 12/02/2016    Priority: Low  . Osteoarthritis of right knee 12/04/2014    Priority: Low  . Former smoker 12/04/2014    Priority: Low    Medications- reviewed and updated Current Outpatient Medications  Medication Sig Dispense Refill  . acetaminophen (TYLENOL) 500 MG tablet Take 500 mg by mouth as needed.    . diphenhydrAMINE (BENADRYL) 25 MG tablet Take 25 mg by mouth at bedtime as needed for allergies.     Marland Kitchen esomeprazole (NEXIUM) 40 MG packet Take 40 mg by mouth daily before breakfast.    . lisinopril (ZESTRIL) 20 MG tablet Take 1 tablet (20 mg total) by mouth daily. 90 tablet 3  . Multiple Vitamin (MULTIVITAMIN WITH MINERALS) TABS tablet Take 1 tablet by mouth daily.  Centrum Silver    . triamcinolone cream (KENALOG) 0.1 % Apply 1 application topically 2 (two) times daily. For 7-10 days maximum (Patient not taking: Reported on 05/31/2020) 80 g 0   No current facility-administered medications for this visit.     Objective:  BP 124/80   Pulse 88   Temp 97.9 F (36.6 C) (Temporal)   Resp 18   Ht 5\' 10"  (1.778 m)   Wt 206 lb 3.2 oz (93.5 kg)   SpO2 97%   BMI 29.59 kg/m  Gen: NAD, resting comfortably  Diabetic Foot Exam - Simple   Simple Foot Form Diabetic Foot exam was performed with the following findings: Yes 06/24/2020 10:08 AM  Visual Inspection No deformities, no ulcerations, no other skin breakdown bilaterally: Yes Sensation Testing Intact to touch and monofilament testing bilaterally: Yes Pulse Check Posterior Tibialis and Dorsalis pulse intact bilaterally: Yes Comments       Assessment and Plan   # Diabetes S: Medication:None at present-patient presents today to discuss possible Metformin 250 mg once daily.  He is concerned about possible side effects. Has a tendency towards loose stools to start with.  CBGs- does not check Lab Results  Component Value Date   HGBA1C 7.3 (H) 06/04/2020   HGBA1C 7.0 (H) 09/28/2019   HGBA1C 5.4 07/13/2016  A/P:  Mild poor control- patient prefers to work on lifestyle to bring tis down instead of starting medicine. Especially with his tendency towards  loose stools. Discussed some healthy habits - see AVS tips -wants to get down to 190 -foot exam normal - refer for diabetic eye exam- does not have eye doctor  #Overweight   S: down 5 lbs from last visit. Has gone to black coffee- has cut out creamer and was doing a lot of creamer, more water . Tried to cut portion size, do less bread. Has tried to cut out sodas.   Exercise limited by back issues.  Wt Readings from Last 3 Encounters:  06/24/20 206 lb 3.2 oz (93.5 kg)  06/04/20 211 lb 3.2 oz (95.8 kg)  11/07/19 208 lb 12.8 oz (94.7 kg)  A/P:   Improving. Encouraged need for healthy eating, regular exercise, weight loss.   #hypertension S: medication: lisinopril 20mg  BP Readings from Last 3 Encounters:  06/24/20 124/80  06/04/20 132/80  11/07/19 122/78  A/P: numbers improving with weight loss- continue current meds lisinopril 20mg . May be able to cut down with ongoing weight loss- he would love this. Encouraged with weight loss to watch out for symptoms like lightheadedness -HR better today  Recommended follow up: Return in about 3 months (around 09/24/2020) for follow up- or sooner if needed. Future Appointments  Date Time Provider [place]  06/06/2021 11:45 AM [facility]    Lab/Order associations:   ICD-10-CM   1. Controlled type 2 diabetes mellitus without complication, without long-term current use of insulin ([place])  E11.9 Ambulatory referral to Ophthalmology  2. Essential hypertension  I10   3. Overweight  E66.3    Return precautions advised.  Garret Reddish, MD

## 2020-06-24 NOTE — Addendum Note (Signed)
Addended by: Thomes Cake on: 06/24/2020 10:21 AM   Modules accepted: Orders

## 2020-06-25 ENCOUNTER — Ambulatory Visit: Payer: Medicare Other | Admitting: Family Medicine

## 2020-07-31 ENCOUNTER — Telehealth: Payer: Self-pay

## 2020-07-31 NOTE — Telephone Encounter (Signed)
Pt called wanting to report his wait to Dr. Yong Channel. Pt states he has dropped down to 191 pounds. Pt asked if Dr. Yong Channel wants him to start taking only 10mg  of his lisinopriil instead of 20mg . Pt asked if Dr. Yong Channel thinks its okay for him to cut the pill in half. Please advise.

## 2020-07-31 NOTE — Telephone Encounter (Signed)
See below

## 2020-07-31 NOTE — Telephone Encounter (Signed)
That's great news- hows his blood pressure looking with the weight loss? Is he feeling lightheaded at all?

## 2020-08-01 NOTE — Telephone Encounter (Signed)
The most important thing at this point to determine if he can come off medication and if the 10 mg dose is safe is his home blood pressure.  I would like for him to be averaging less than 135/85 on home readings on 10 mg.  If he wants to come off medication completely would prefer blood pressure less than 120/80 consistently for a week and that he will continue to monitor after change

## 2020-08-01 NOTE — Telephone Encounter (Signed)
Pt notified and states he will update [place] around the 2nd week of January to give time for his body to get acclimated to the 10Mg .

## 2020-08-01 NOTE — Telephone Encounter (Signed)
Pt states he has not been feeling lightheaded and he has not been checking his readings but he can imagine that it is running good. He has been taking 10mg  of lisinopril instead of the 20Mg . He started doing this today and wanted to know how long he can stay on 10Mg  before coming completely off. Pt will keep a log of his numbers being on 10mg  and will update you through mychart with those readings.

## 2020-08-04 IMAGING — CR DG OR LOCAL ABDOMEN
1 series · 1 of 1 positions shown · non-contrast
Comparison: CT scan September 01, 2018.

CLINICAL DATA: Instrument search.

EXAM:
OR LOCAL ABDOMEN

[AP]
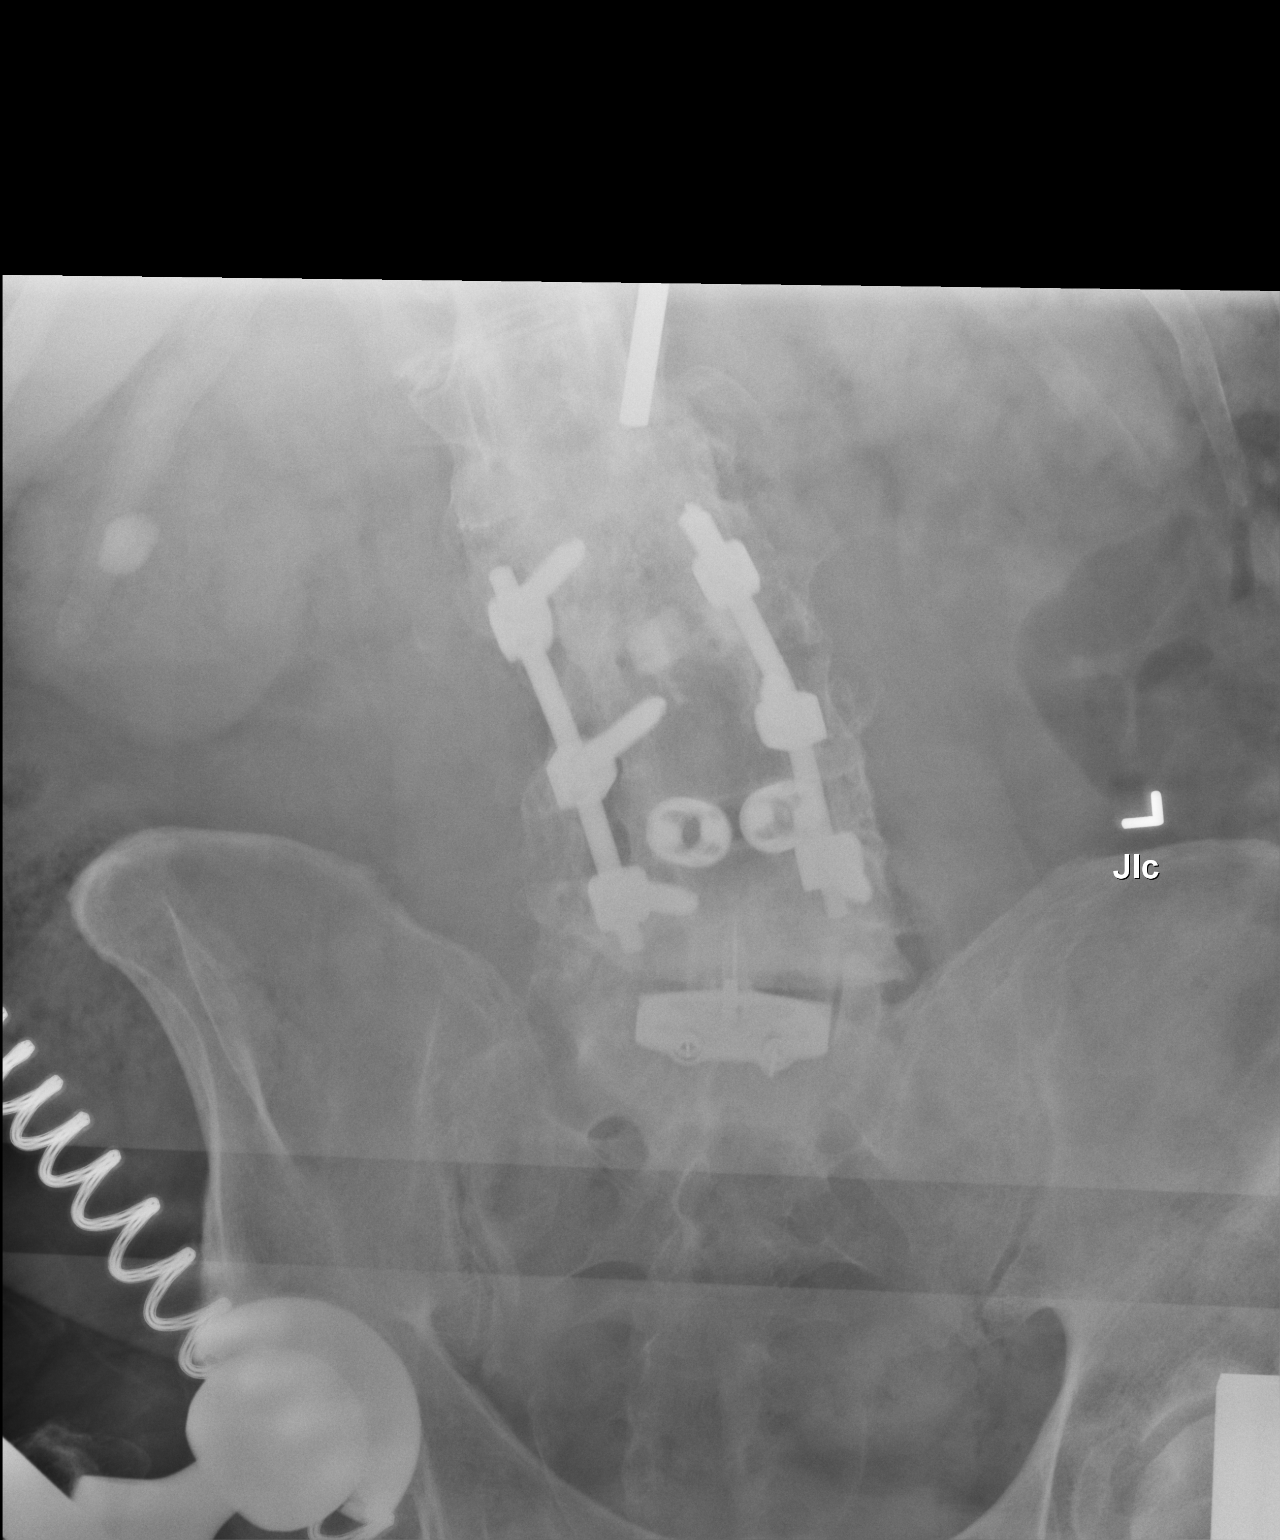

[1 of 1 positions shown; findings below may reference images not displayed]

FINDINGS: The bowel gas pattern is normal. Large right renal calculus is
noted. Postsurgical changes are noted in the lumbar spine. No
definite radiopaque foreign body is noted..
IMPRESSION: Other than expected surgical hardware in lumbar spine, no definite
radiopaque foreign body is noted. These results were called by
OR 21, who verbally acknowledged these results.

## 2020-08-04 IMAGING — RF DG LUMBAR SPINE 2-3V
1 series · 1 of 1 positions shown · non-contrast
Comparison: CT lumbar 09/01/2018

CLINICAL DATA: Anterior interbody fusion L5-S1. L3 to iliac fusion
with pedicle screws. L2-3 decompressive laminectomy

EXAM:
DG C-ARM 61-120 MIN; LUMBAR SPINE - 2-3 VIEW

[Series 1: run · 1 of 1 slices shown]
[im 1/1]
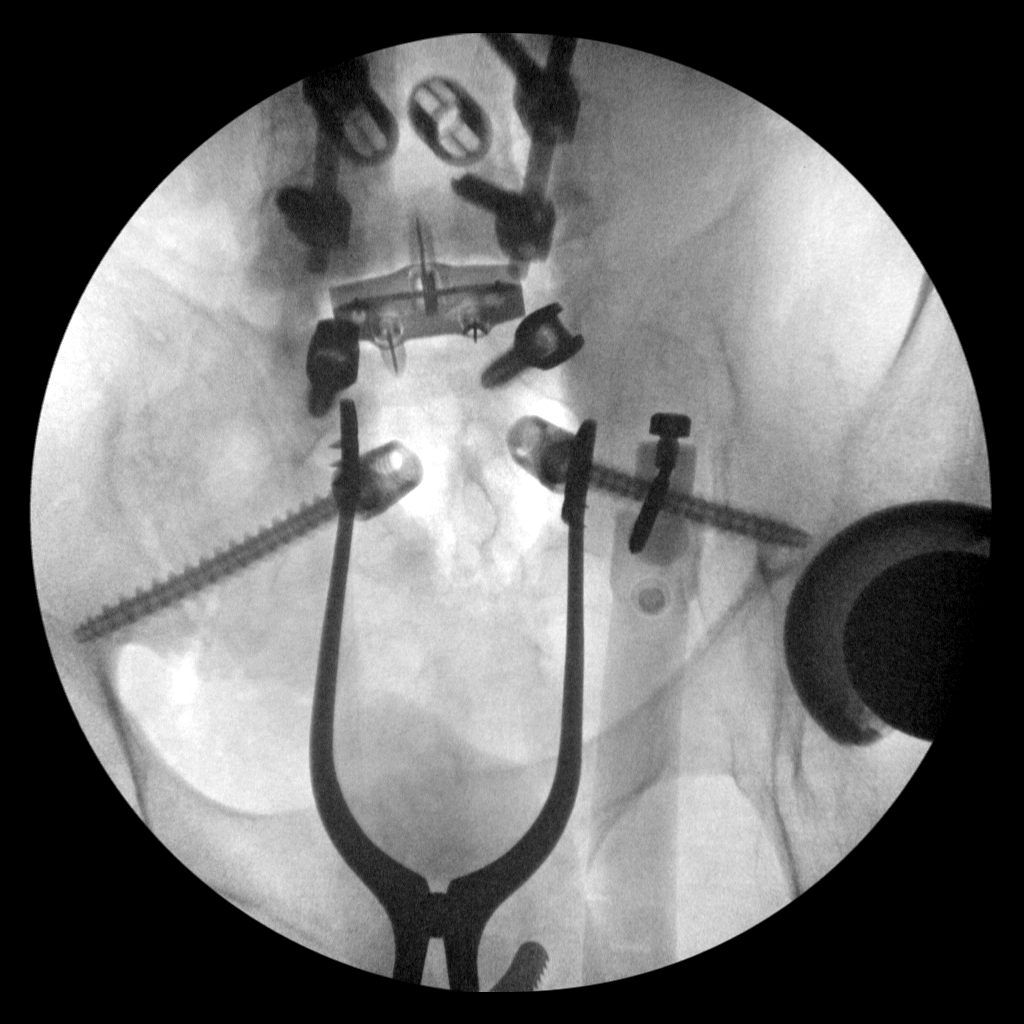

[1 of 1 positions shown; findings below may reference images not displayed]

FINDINGS: AP and lateral C-arm images were obtained in the operating room.

Anterior interbody fusion hardware in satisfactory position at L5-S1
with screws extending into L5 and S1 vertebral bodies.

Posterior iliac bone screws are placed on the AP view. Pre-existing
pedicle screw and interbody fusion L3-4 and L4-5 unchanged from the
preop study.
IMPRESSION: Anterior interbody fusion L5-S1.

Bilateral iliac bone screws.

## 2020-08-10 IMAGING — CT CT HEAD W/O CM
4 series · 16 of 47 positions shown, 18 images · non-contrast
Comparison: None.

CLINICAL DATA: 53 y/o M; status post lumbar decompression fusion
surgery with CSF leak.

EXAM:
CT HEAD WITHOUT CONTRAST
TECHNIQUE: Contiguous axial images were obtained from the base of the skull
through the vertex without intravenous contrast.

[Series 3: head without · axial · non-contrast · 0.48mm/px · z∈[-50,+80]mm · 7 of 36 slices shown, 9 images]
[im 5/36  brain]
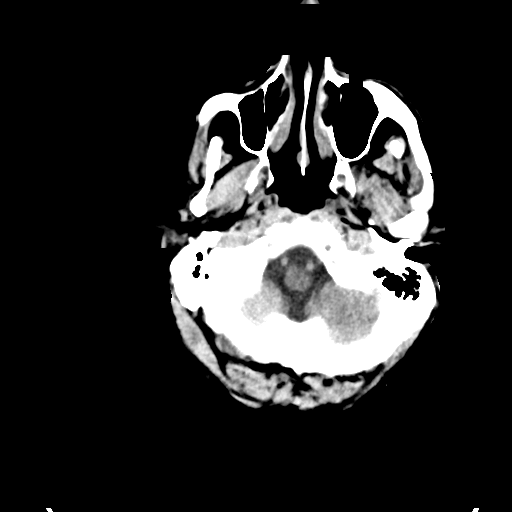
[im 5/36  bone]
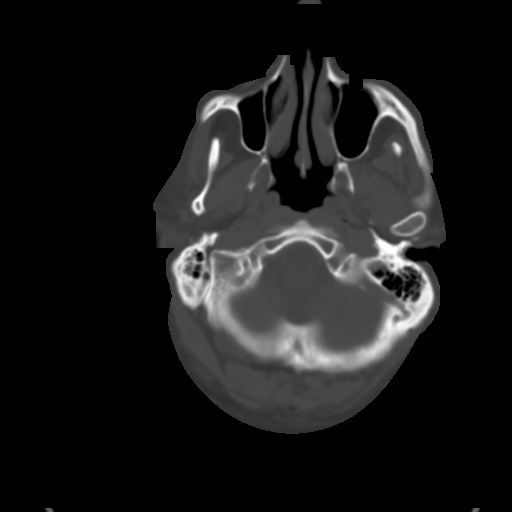
[im 9/36  brain]
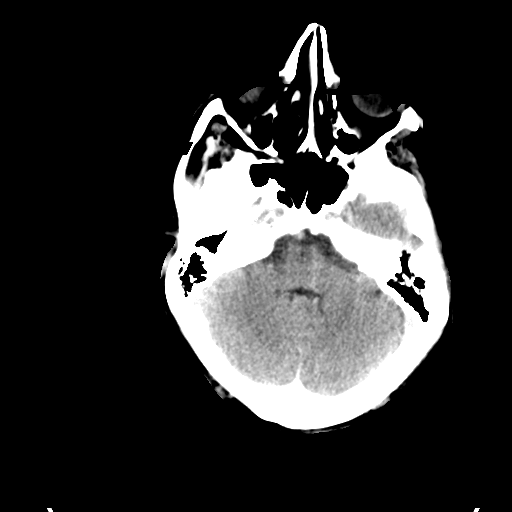
[im 14/36  brain]
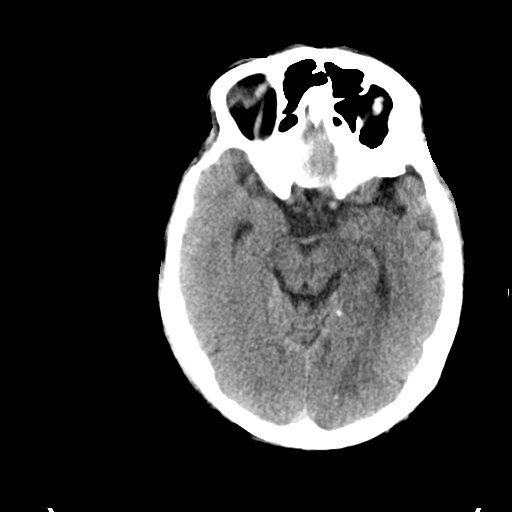
[im 18/36  brain]
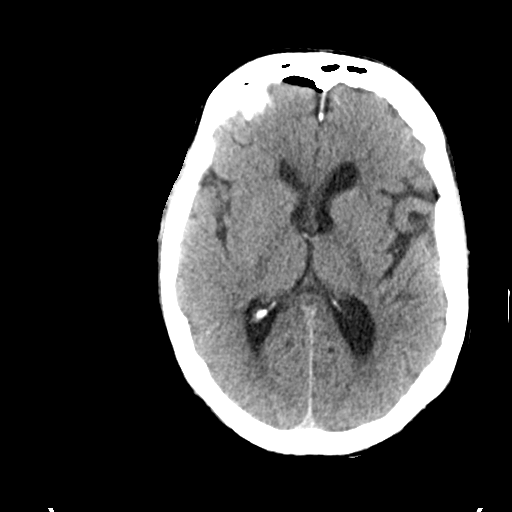
[im 22/36  brain]
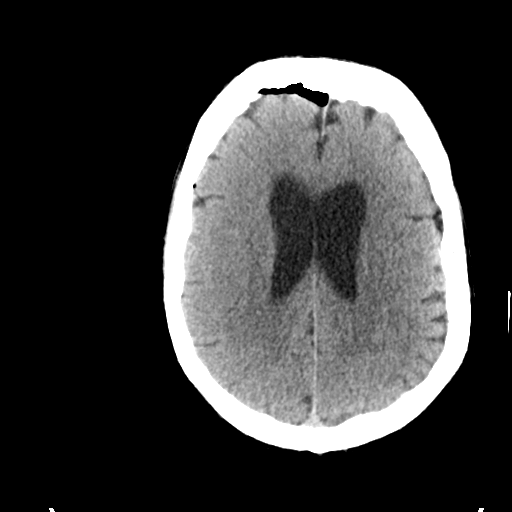
[im 22/36  bone]
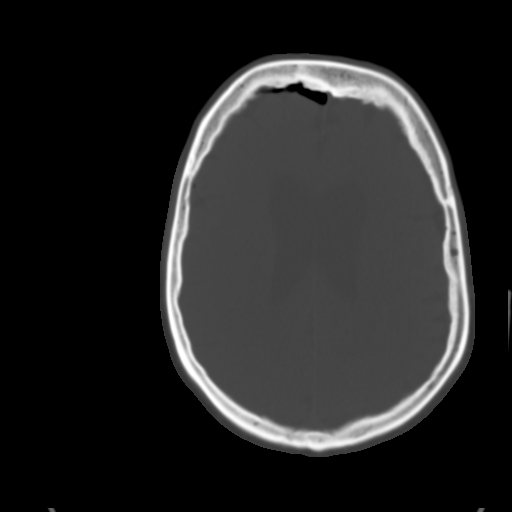
[im 27/36  brain]
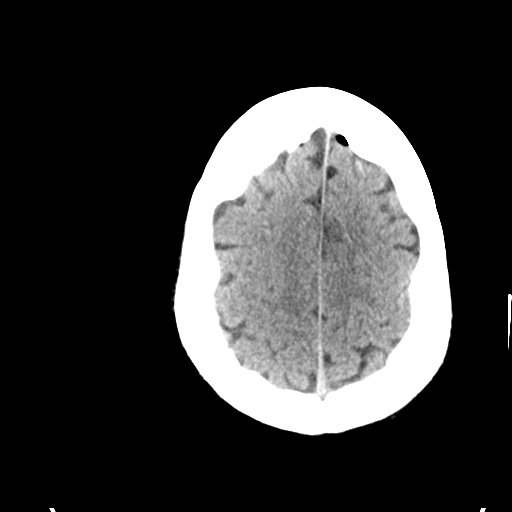
[im 31/36  brain]
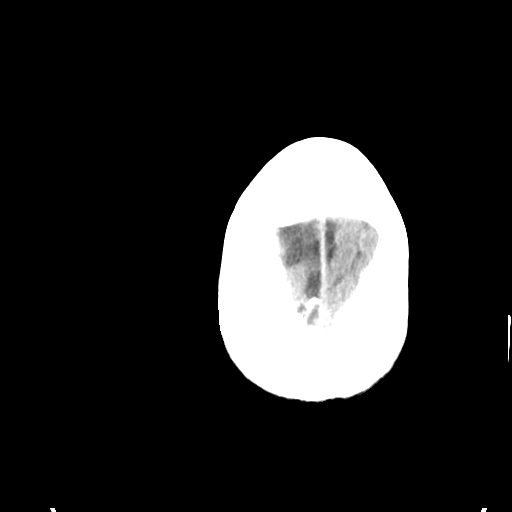

[Series 4: head bone · axial · 0.48mm/px · z∈[-54,-18]mm · 3 of 89 slices shown]
[im 9/89  bone]
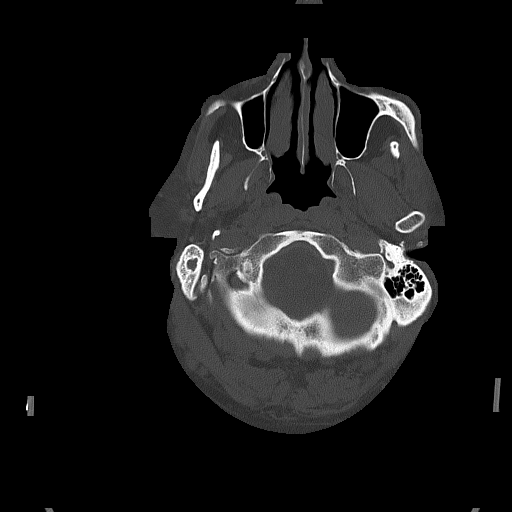
[im 18/89  bone]
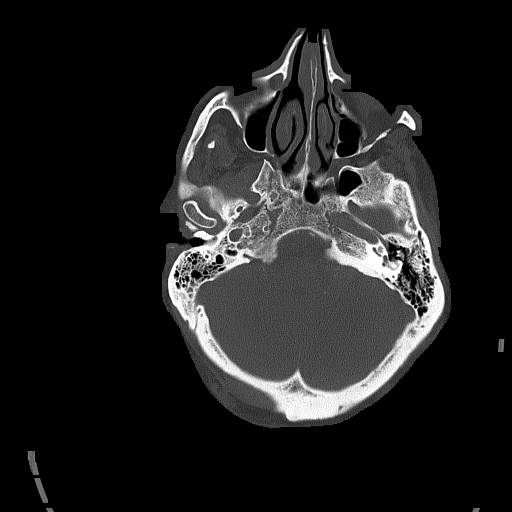
[im 27/89  bone]
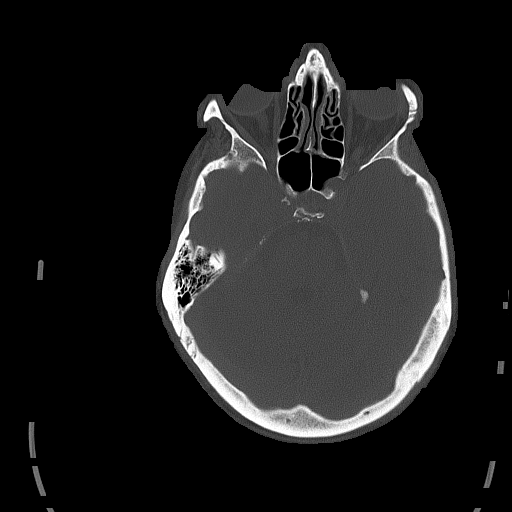

[Series 5: head without cor · coronal · non-contrast · 0.35mm/px · 3 of 68 slices shown]
[im 23/68  brain]
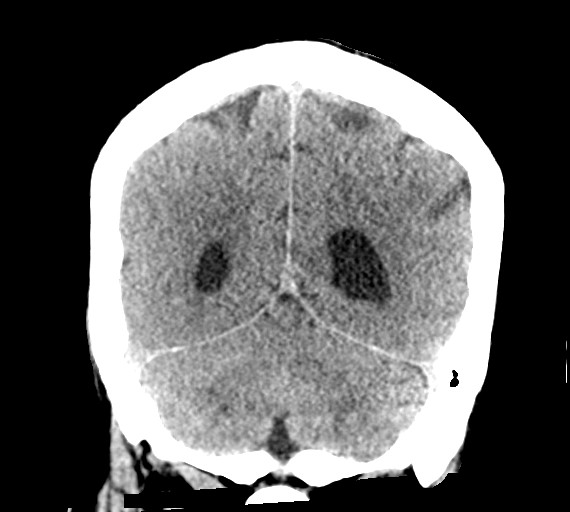
[im 30/68  brain]
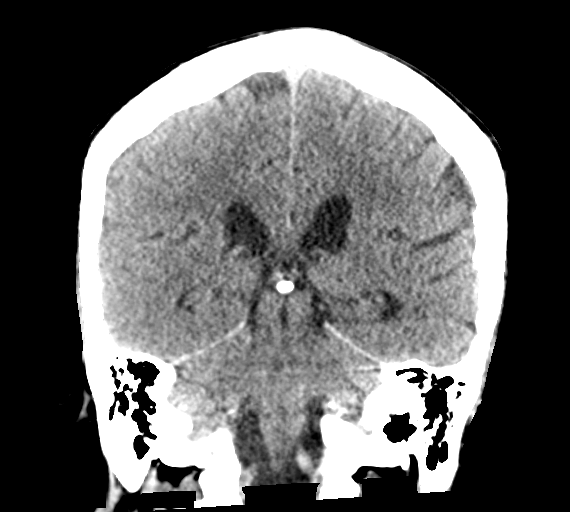
[im 38/68  brain]
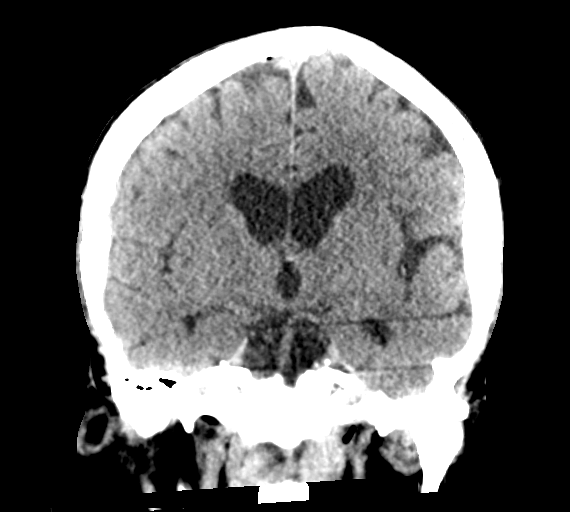

[Series 6: head without sag · sagittal · non-contrast · 0.36mm/px · 3 of 64 slices shown]
[im 22/64  brain]
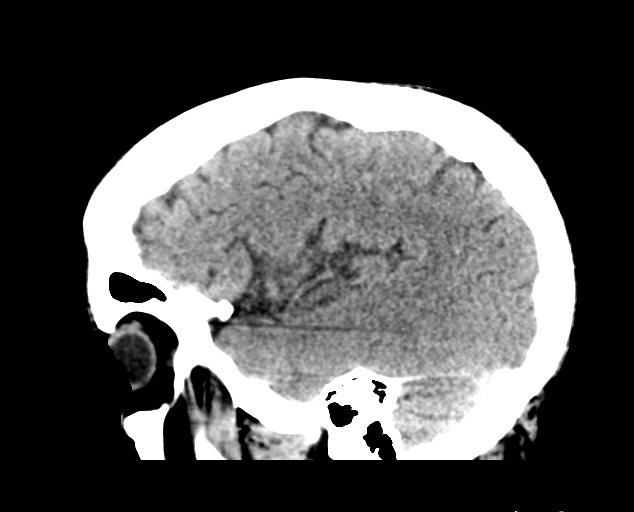
[im 32/64  brain]
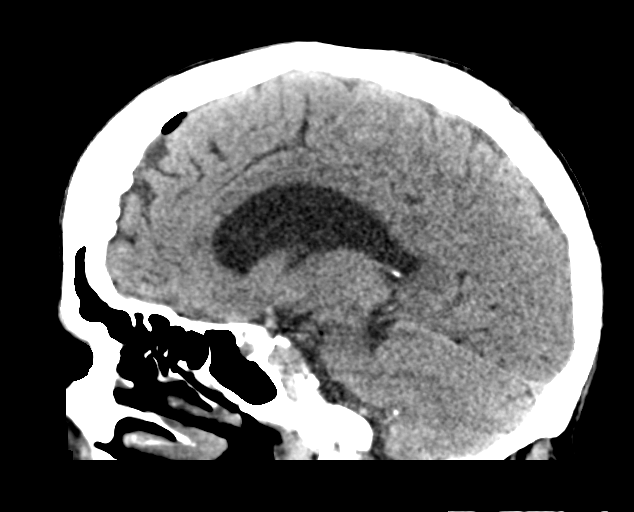
[im 43/64  brain]
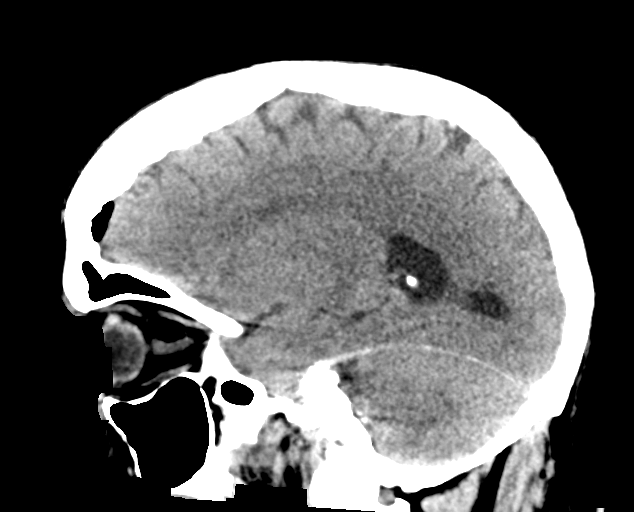

[16 of 47 positions shown; findings below may reference images not displayed]

FINDINGS: Brain: Small volume pneumocephalus over the frontal convexities
bilaterally. No stroke, hemorrhage, focal mass effect,
hydrocephalus, or herniation. No herniation of the cerebellar
tonsils. Mild volume loss of the brain and chronic microvascular
ischemic changes.

Vascular: Calcific atherosclerosis of the carotid siphons. No
hyperdense vessel identified.

Skull: Normal. Negative for fracture or focal lesion.

Sinuses/Orbits: Partial right mastoid opacification. Normal aeration
of the visible paranasal sinuses and left mastoid air cells.

Other: None.
IMPRESSION: 1. Small volume pneumocephalus over the frontal convexities
bilaterally, intracranial air has probably traveled superiorly from
the lumbar region given recent history of CSF leak.
2. No CT findings of intracranial hypotension or cerebellar
tonsillar herniation at this time.
3. Mild chronic microvascular ischemic changes and mild volume loss
of the brain.

## 2020-08-19 ENCOUNTER — Encounter: Payer: Self-pay | Admitting: Family Medicine

## 2020-08-19 ENCOUNTER — Telehealth (INDEPENDENT_AMBULATORY_CARE_PROVIDER_SITE_OTHER): Payer: Medicare Other | Admitting: Family Medicine

## 2020-08-19 ENCOUNTER — Other Ambulatory Visit: Payer: Self-pay

## 2020-08-19 DIAGNOSIS — R490 Dysphonia: Secondary | ICD-10-CM | POA: Diagnosis not present

## 2020-08-19 DIAGNOSIS — J301 Allergic rhinitis due to pollen: Secondary | ICD-10-CM | POA: Diagnosis not present

## 2020-08-19 MED ORDER — FLUTICASONE PROPIONATE 50 MCG/ACT NA SUSP: 1.0000 | Freq: Every day | NASAL | 6 refills | Status: DC

## 2020-08-19 NOTE — Progress Notes (Signed)
Virtual Visit via Video Note  Subjective  CC:  Chief Complaint  Patient presents with  . Allergies    Did some yard work a few weeks ago, sneezing and dry cough, loss voice, is not vaccinated, no recent COVID test     I connected with Lukah Goswami. on 08/19/20 at  2:00 PM EST by a video enabled telemedicine application and verified that I am speaking with the correct person using two identifiers. Location patient: Home Location provider: [facility] [address], Office Persons participating in the virtual visit: [name] White., Willow Ora, MD Adela Glimpse CMA  I discussed the limitations of evaluation and management by telemedicine and the availability of in person appointments. The patient expressed understanding and agreed to proceed. HPI: [name] White. is a 55 y.o. male who was contacted today to address the problems listed above in the chief complaint. . 55 year old male with diabetes who reports postnasal drainage, rhinorrhea and hoarseness.  This started the day after he was outside mulching his yard.  He has a chronic history of allergies.  Remote history of immunotherapy for severe allergies.  As of late, he has been taking Claritin.  He denies fever, cough, sore throat, loss of taste or smell, myalgias or shortness of breath.  No known Covid exposures.  He feels it is mostly allergies.  He does have sneezing.  No GI symptoms.  He has continued to be outside frequently over the last several weeks due to the warmer weather.  His symptoms will come and go.  He denies sinus pain  Sugars are stable Assessment  1. Seasonal allergic rhinitis due to pollen   2. Hoarseness      Plan   Allergic rhinitis, exacerbated by mulch: Education given.  Step up therapy to Allegra or Zyrtec daily.  Decongestant as needed.  Add Flonase.  Follow-up if not improving over the next 2 weeks.  No red flag symptoms identified I discussed the assessment and treatment plan  with the patient. The patient was provided an opportunity to ask questions and all were answered. The patient agreed with the plan and demonstrated an understanding of the instructions.   The patient was advised to call back or seek an in-person evaluation if the symptoms worsen or if the condition fails to improve as anticipated. Follow up: As scheduled 09/24/2020  Meds ordered this encounter  Medications  . fluticasone (FLONASE) 50 MCG/ACT nasal spray    Sig: Place 1 spray into both nostrils daily.    Dispense:  16 g    Refill:  6      I reviewed the patients updated PMH, FH, and SocHx.    Patient Active Problem List   Diagnosis Date Noted  . Status post spinal surgery 09/20/2018  . Degenerative spondylolisthesis 09/20/2018  . Arthritis of right hip 12/07/2016  . Primary osteoarthritis of right hip 12/02/2016  . History of colon polyps 09/16/2016  . Genital warts 07/06/2016  . Osteoarthritis of right knee 12/04/2014  . Former smoker 12/04/2014  . Diabetes mellitus type II, controlled ([facility]) 05/27/2012  . SVT/ PSVT/ PAT 06/16/2010  . SCOLIOSIS, THORACOLUMBAR 01/06/2010  . Hyperlipidemia 04/14/2007  . Essential hypertension 03/07/2007   Current Meds  Medication Sig  . acetaminophen (TYLENOL) 500 MG tablet Take 500 mg by mouth as needed.  . diphenhydrAMINE (BENADRYL) 25 MG tablet Take 25 mg by mouth at bedtime as needed for allergies.  Marland Kitchen esomeprazole (  NEXIUM) 40 MG packet Take 40 mg by mouth daily before breakfast.  . fluticasone (FLONASE) 50 MCG/ACT nasal spray Place 1 spray into both nostrils daily.  Marland Kitchen lisinopril (ZESTRIL) 10 MG tablet Take 10 mg by mouth daily.  Marland Kitchen MAGNESIUM PO Take by mouth.  . naproxen sodium (ALEVE) 220 MG tablet Take 220 mg by mouth.  . Potassium 99 MG TABS Take by mouth.  . [DISCONTINUED] lisinopril (ZESTRIL) 20 MG tablet Take 1 tablet (20 mg total) by mouth daily. (Patient taking differently: Take 10 mg by mouth daily.)    Allergies: Patient has No  Known Allergies. Family History: Patient family history includes Atrial fibrillation in his father; Breast cancer in his mother; Diabetes in his father; Heart attack in his father; Liver disease in his paternal uncle. Social History:  Patient  reports that he quit smoking about 9 years ago. His smoking use included cigarettes. He has a 2.40 pack-year smoking history. He has never used smokeless tobacco. He reports current alcohol use of about 12.0 standard drinks of alcohol per week. He reports that he does not use drugs.  Review of Systems: Constitutional: Negative for fever malaise or anorexia Cardiovascular: negative for chest pain Respiratory: negative for SOB or persistent cough Gastrointestinal: negative for abdominal pain  OBJECTIVE Vitals: There were no vitals taken for this visit. General: no acute distress , A&Ox3, no respiratory distress HEENT: Mild nasal congestion, mild hoarseness, appears well  Willow Ora, MD

## 2020-08-21 ENCOUNTER — Telehealth: Payer: Self-pay

## 2020-08-21 ENCOUNTER — Other Ambulatory Visit: Payer: Self-pay

## 2020-08-21 MED ORDER — AZITHROMYCIN 250 MG PO TABS: ORAL_TABLET | ORAL | 0 refills | Status: DC

## 2020-08-21 NOTE — Telephone Encounter (Signed)
Please advise 

## 2020-08-21 NOTE — Telephone Encounter (Signed)
Please order zpak and notify pt. F/u with Dr. Durene Cal if not improving.

## 2020-08-21 NOTE — Telephone Encounter (Signed)
Patient was seen by [facility] on 1/3 and states he has tried the [employer] but it has not helped any of his allergies. Asked if he could get an antibiotic.

## 2020-08-21 NOTE — Telephone Encounter (Signed)
Aware of medication being sent to pharmacy. Agreeable to f/u with PCP if no improvement

## 2020-09-10 DIAGNOSIS — M25512 Pain in left shoulder: Secondary | ICD-10-CM | POA: Diagnosis not present

## 2020-09-20 ENCOUNTER — Other Ambulatory Visit: Payer: Self-pay | Admitting: Family Medicine

## 2020-09-21 NOTE — Patient Instructions (Addendum)
Thanks for doing labs If you have mychart- we will send your results within 3 business days of [place] receiving them.  If you do not have mychart- we will call you about results within 5 business days of [place] receiving them.  *please also note that you will see labs on mychart as soon as they post. I will later go in and write notes on them- will say "notes from Dr. Yong Channel"  home blood pressures above goal. Would prefer <135/85 on average. I think higher today due to shoulder pain. He will resume lisinopril 10 mg and monitor. Stopping meloxicam is also recommended if orthopedics ok with this  Health Maintenance Due  Topic Date Due  . OPHTHALMOLOGY EXAM If you have not had an eye exam within a year, please get one at this time as this is important for your diabetes care  Never done   Recommended follow up: Return in about 14 weeks (around 12/31/2020) for follow up- or sooner if needed.

## 2020-09-21 NOTE — Progress Notes (Signed)
Phone [phone] In person visit   Subjective:   [name] Wion. is a 55 y.o. year old very pleasant male patient who presents for/with See problem oriented charting Chief Complaint  Patient presents with  . Hypertension  . Diabetes  . Hyperlipidemia   This visit occurred during the SARS-CoV-2 public health emergency.  Safety protocols were in place, including screening questions prior to the visit, additional usage of staff PPE, and extensive cleaning of exam room while observing appropriate contact time as indicated for disinfecting solutions.   Past Medical History-  Patient Active Problem List   Diagnosis Date Noted  . Status post spinal surgery 09/20/2018    Priority: High  . Diabetes mellitus type II, controlled ([place]) 05/27/2012    Priority: High  . SVT/ PSVT/ PAT 06/16/2010    Priority: High  . History of colon polyps 09/16/2016    Priority: Medium  . Genital warts 07/06/2016    Priority: Medium  . SCOLIOSIS, THORACOLUMBAR 01/06/2010    Priority: Medium  . Hyperlipidemia 04/14/2007    Priority: Medium  . Essential hypertension 03/07/2007    Priority: Medium  . Degenerative spondylolisthesis 09/20/2018    Priority: Low  . Arthritis of right hip 12/07/2016    Priority: Low  . Primary osteoarthritis of right hip 12/02/2016    Priority: Low  . Osteoarthritis of right knee 12/04/2014    Priority: Low  . Former smoker 12/04/2014    Priority: Low    Medications- reviewed and updated Current Outpatient Medications  Medication Sig Dispense Refill  . acetaminophen (TYLENOL) 500 MG tablet Take 500 mg by mouth as needed.    Marland Kitchen esomeprazole (NEXIUM) 40 MG packet Take 40 mg by mouth daily before breakfast.    . MAGNESIUM PO Take by mouth.    . meloxicam (MOBIC) 15 MG tablet Take 15 mg by mouth daily.    . Multiple Vitamin (MULTIVITAMIN WITH MINERALS) TABS tablet Take 1 tablet by mouth daily. Centrum Silver    . naproxen sodium (ALEVE) 220 MG tablet Take 220 mg by  mouth.    . Potassium 99 MG TABS Take by mouth.    Marland Kitchen lisinopril (ZESTRIL) 10 MG tablet Take 10 mg by mouth daily. (Patient not taking: Reported on 09/24/2020)     No current facility-administered medications for this visit.     Objective:  BP (!) 188/118   Pulse 97   Temp 97.6 F (36.4 C) (Temporal)   Ht 5\' 10"  (1.778 m)   Wt 191 lb 3.2 oz (86.7 kg)   SpO2 98%   BMI 27.43 kg/m  Gen: NAD, resting comfortably CV: RRR no murmurs rubs or gallops Lungs: CTAB no crackles, wheeze, rhonchi ext: no edema Skin: warm, dry MSK: Patient winces in pain when trying to get right arm out of his shirt as this caused movement of left shoulder-left shoulder source of pain    Assessment and Plan   #Severe left shoulder pain S: Patient had a fall in his garage and landed on his left wrist also striking his left forehead and causing a laceration (which is now healed).  Approximately a month later noted severe left shoulder pain with some minor paresthesias to the left hand.  He saw Dr. Damita Dunnings PA and prescribed course of meloxicam-have been given option of injection.  Patient states he has not had any improvement and is in severe pain at present.  Certain movements really cause significant pain.  No exertional element to pain.  Not necessarily  worse with neck movement though if he turns and lays on the left side at night it will wake him up A/P: I strongly advised with severity of pain for patient to follow-up with orthopedics again-I think he needs to consider an injection at this point with 2 weeks of meloxicam without improvement.  Also the meloxicam may be raising his blood pressure and would like for him to stop this if possible -We discussed if A1c was below 7.5 would be very reasonable to receive steroid injection -He is also seeing Dr. Trenton Gammon of neurosurgery on Thursday and can run the case by him but does not seem primarily to be neck related  #Former smoker-No cigarettes in 4 months -congratulated  patient  #hypertension S: medication: lisinopril 10Mg  (he stopped this a few weeks ago) Home readings #s: before pain went up usually 145/80 up to 160/80 BP Readings from Last 3 Encounters:  09/24/20 (!) 188/118  06/24/20 124/80  06/04/20 132/80  A/P: Very poor control today but in acute pain home blood pressures above goal. Would prefer <135/85 on average.  He will resume lisinopril 10 mg and monitor-agrees to take a dose tonight-should seek care by calling 911 if symptoms such as chest pain or blurry vision or shortness of breath develop.  I also asked him to stop the meloxicam-he will run this by orthopedics first  # Diabetes S: Medication:opted out of meds  CBGs- does not check Exercise and diet- done 15 lbs from last visit Lab Results  Component Value Date   HGBA1C 7.3 (H) 06/04/2020   HGBA1C 7.0 (H) 09/28/2019   HGBA1C 5.4 07/13/2016   A/P: hoping significant improvement with lifestyle changes.  Congratulated him on 15 pounds weight loss primarily with dietary change-exercise limited by pain  #hyperlipidemia S: Medication: none  Other than 1000mg  of fish oil a week ago Lab Results  Component Value Date   CHOL 209 (H) 09/28/2019   HDL 45.50 09/28/2019   LDLCALC 81 07/13/2016   LDLDIRECT 139.0 09/28/2019   TRIG 249.0 (H) 09/28/2019   CHOLHDL 5 09/28/2019   A/P:  Not fasting today so triglycerides may be too high. We discussed likely start statin even if #s improved based off new guidelines though could use once a week and id lean toward that as he wants to minimize medicine.  -may also take a redwood supplement and ut out magnesium, potassium, and MV though has read may help bloodflow and BP- we would have to monitor and see  Recommended follow up: Return in about 14 weeks (around 12/31/2020) for follow up- or sooner if needed.  If A1c controlled could stretch to 55-month Future Appointments  Date Time Provider [place]  12/31/2020 10:40 AM Marin Olp, MD  [facility]  06/06/2021 11:45 AM [facility] COACH [facility]   Lab/Order associations:   ICD-10-CM   1. Essential hypertension  I10 CBC with Differential/Platelet    Comprehensive metabolic panel    Lipid panel  2. Controlled type 2 diabetes mellitus without complication, without long-term current use of insulin ([facility])  E11.9 Hemoglobin A1c    CBC with Differential/Platelet    Comprehensive metabolic panel    Lipid panel  3. Hyperlipidemia, unspecified hyperlipidemia type  E78.5 CBC with Differential/Platelet    Comprehensive metabolic panel    Lipid panel   Return precautions advised.  Garret Reddish, MD

## 2020-09-24 ENCOUNTER — Ambulatory Visit (INDEPENDENT_AMBULATORY_CARE_PROVIDER_SITE_OTHER): Payer: Medicare Other | Admitting: Family Medicine

## 2020-09-24 ENCOUNTER — Other Ambulatory Visit: Payer: Self-pay

## 2020-09-24 ENCOUNTER — Encounter: Payer: Self-pay | Admitting: Family Medicine

## 2020-09-24 VITALS — BP 188/118 | HR 97 | Temp 97.6°F | Ht 70.0 in | Wt 191.2 lb

## 2020-09-24 DIAGNOSIS — E119 Type 2 diabetes mellitus without complications: Secondary | ICD-10-CM | POA: Diagnosis not present

## 2020-09-24 DIAGNOSIS — E785 Hyperlipidemia, unspecified: Secondary | ICD-10-CM | POA: Diagnosis not present

## 2020-09-24 DIAGNOSIS — I10 Essential (primary) hypertension: Secondary | ICD-10-CM

## 2020-09-25 LAB — HEMOGLOBIN A1C: Hgb A1c MFr Bld: 6.3 % (ref 4.6–6.5)

## 2020-09-25 LAB — LIPID PANEL
Cholesterol: 217 mg/dL — ABNORMAL HIGH (ref 0–200)
HDL: 65.6 mg/dL (ref >39.00)
LDL Cholesterol: 114 mg/dL — ABNORMAL HIGH (ref 0–99)
NonHDL: 151.14
Total CHOL/HDL Ratio: 3
Triglycerides: 185 mg/dL — ABNORMAL HIGH (ref 0.0–149.0)
VLDL: 37 mg/dL (ref 0.0–40.0)

## 2020-09-25 LAB — COMPREHENSIVE METABOLIC PANEL
ALT: 47 U/L (ref 0–53)
AST: 38 U/L — ABNORMAL HIGH (ref 0–37)
Albumin: 4.3 g/dL (ref 3.5–5.2)
Alkaline Phosphatase: 56 U/L (ref 39–117)
BUN: 13 mg/dL (ref 6–23)
CO2: 32 mEq/L (ref 19–32)
Calcium: 9.5 mg/dL (ref 8.4–10.5)
Chloride: 100 mEq/L (ref 96–112)
Creatinine, Ser: 0.95 mg/dL (ref 0.40–1.50)
GFR: 89.12 mL/min (ref >60.00)
Glucose, Bld: 161 mg/dL — ABNORMAL HIGH (ref 70–99)
Potassium: 4.4 mEq/L (ref 3.5–5.1)
Sodium: 139 mEq/L (ref 135–145)
Total Bilirubin: 0.8 mg/dL (ref 0.2–1.2)
Total Protein: 7.4 g/dL (ref 6.0–8.3)

## 2020-09-25 LAB — CBC WITH DIFFERENTIAL/PLATELET
Basophils Absolute: 0.1 10*3/uL (ref 0.0–0.1)
Basophils Relative: 1 % (ref 0.0–3.0)
Eosinophils Absolute: 0.1 10*3/uL (ref 0.0–0.7)
Eosinophils Relative: 0.8 % (ref 0.0–5.0)
HCT: 50.2 % (ref 39.0–52.0)
Hemoglobin: 17.2 g/dL — ABNORMAL HIGH (ref 13.0–17.0)
Lymphocytes Relative: 19.1 % (ref 12.0–46.0)
Lymphs Abs: 1.3 10*3/uL (ref 0.7–4.0)
MCHC: 34.2 g/dL (ref 30.0–36.0)
MCV: 91.7 fl (ref 78.0–100.0)
Monocytes Absolute: 0.7 10*3/uL (ref 0.1–1.0)
Monocytes Relative: 9.9 % (ref 3.0–12.0)
Neutro Abs: 4.6 10*3/uL (ref 1.4–7.7)
Neutrophils Relative %: 69.2 % (ref 43.0–77.0)
Platelets: 252 10*3/uL (ref 150.0–400.0)
RBC: 5.48 Mil/uL (ref 4.22–5.81)
RDW: 14.5 % (ref 11.5–15.5)
WBC: 6.7 10*3/uL (ref 4.0–10.5)

## 2020-09-26 DIAGNOSIS — M5412 Radiculopathy, cervical region: Secondary | ICD-10-CM | POA: Diagnosis not present

## 2020-09-26 DIAGNOSIS — M5126 Other intervertebral disc displacement, lumbar region: Secondary | ICD-10-CM | POA: Diagnosis not present

## 2020-10-23 DIAGNOSIS — M4722 Other spondylosis with radiculopathy, cervical region: Secondary | ICD-10-CM | POA: Diagnosis not present

## 2020-10-23 DIAGNOSIS — M5412 Radiculopathy, cervical region: Secondary | ICD-10-CM | POA: Diagnosis not present

## 2020-10-23 DIAGNOSIS — M4802 Spinal stenosis, cervical region: Secondary | ICD-10-CM | POA: Diagnosis not present

## 2020-10-24 DIAGNOSIS — M5412 Radiculopathy, cervical region: Secondary | ICD-10-CM | POA: Diagnosis not present

## 2020-10-24 DIAGNOSIS — I10 Essential (primary) hypertension: Secondary | ICD-10-CM | POA: Diagnosis not present

## 2020-10-24 DIAGNOSIS — M5126 Other intervertebral disc displacement, lumbar region: Secondary | ICD-10-CM | POA: Diagnosis not present

## 2020-10-24 DIAGNOSIS — Z6826 Body mass index (BMI) 26.0-26.9, adult: Secondary | ICD-10-CM | POA: Diagnosis not present

## 2020-10-25 ENCOUNTER — Other Ambulatory Visit: Payer: Self-pay | Admitting: Family Medicine

## 2020-10-29 ENCOUNTER — Other Ambulatory Visit: Payer: Self-pay | Admitting: Neurosurgery

## 2020-10-29 ENCOUNTER — Telehealth: Payer: Self-pay

## 2020-10-29 MED ORDER — LISINOPRIL 10 MG PO TABS: 10.0000 mg | ORAL_TABLET | Freq: Every day | ORAL | 2 refills | Status: DC

## 2020-10-29 NOTE — Telephone Encounter (Signed)
..   LAST APPOINTMENT DATE: 10/25/2020   NEXT APPOINTMENT DATE:@5 /17/2022  MEDICATION:lisinopril (ZESTRIL) 10 MG tablet    PHARMACY:[id number] - [place], [place] - [address] [place] [address] [place] [address]

## 2020-10-29 NOTE — Telephone Encounter (Signed)
Lisinopril sent to pharmacy.

## 2020-11-01 ENCOUNTER — Other Ambulatory Visit: Payer: Self-pay

## 2020-11-01 ENCOUNTER — Encounter (HOSPITAL_COMMUNITY): Payer: Self-pay

## 2020-11-01 ENCOUNTER — Encounter (HOSPITAL_COMMUNITY)
Admission: RE | Admit: 2020-11-01 | Discharge: 2020-11-01 | Disposition: A | Discharge status: Home / Self Care | Payer: Medicare Other | Source: Ambulatory Visit | Attending: Neurosurgery | Admitting: Neurosurgery

## 2020-11-01 DIAGNOSIS — Z01818 Encounter for other preprocedural examination: Secondary | ICD-10-CM | POA: Diagnosis not present

## 2020-11-01 DIAGNOSIS — Z20822 Contact with and (suspected) exposure to covid-19: Secondary | ICD-10-CM | POA: Diagnosis not present

## 2020-11-01 HISTORY — DX: Prediabetes: R73.03

## 2020-11-01 LAB — CBC WITH DIFFERENTIAL/PLATELET
Abs Immature Granulocytes: 0.02 10*3/uL (ref 0.00–0.07)
Basophils Absolute: 0 10*3/uL (ref 0.0–0.1)
Basophils Relative: 0 %
Eosinophils Absolute: 0 10*3/uL (ref 0.0–0.5)
Eosinophils Relative: 0 %
HCT: 54.5 % — ABNORMAL HIGH (ref 39.0–52.0)
Hemoglobin: 18.5 g/dL — ABNORMAL HIGH (ref 13.0–17.0)
Immature Granulocytes: 0 %
Lymphocytes Relative: 14 %
Lymphs Abs: 1.3 10*3/uL (ref 0.7–4.0)
MCH: 31.5 pg (ref 26.0–34.0)
MCHC: 33.9 g/dL (ref 30.0–36.0)
MCV: 92.7 fL (ref 80.0–100.0)
Monocytes Absolute: 0.9 10*3/uL (ref 0.1–1.0)
Monocytes Relative: 9 %
Neutro Abs: 7.2 10*3/uL (ref 1.7–7.7)
Neutrophils Relative %: 77 %
Platelets: 246 10*3/uL (ref 150–400)
RBC: 5.88 MIL/uL — ABNORMAL HIGH (ref 4.22–5.81)
RDW: 13.9 % (ref 11.5–15.5)
WBC: 9.4 10*3/uL (ref 4.0–10.5)
nRBC: 0 % (ref 0.0–0.2)

## 2020-11-01 LAB — BASIC METABOLIC PANEL
Anion gap: 6 (ref 5–15)
BUN: 20 mg/dL (ref 6–20)
CO2: 29 mmol/L (ref 22–32)
Calcium: 9.3 mg/dL (ref 8.9–10.3)
Chloride: 102 mmol/L (ref 98–111)
Creatinine, Ser: 0.86 mg/dL (ref 0.61–1.24)
GFR, Estimated: >60 mL/min (ref >60)
Glucose, Bld: 54 mg/dL — ABNORMAL LOW (ref 70–99)
Potassium: 4 mmol/L (ref 3.5–5.1)
Sodium: 137 mmol/L (ref 135–145)

## 2020-11-01 LAB — SURGICAL PCR SCREEN
MRSA, PCR: NEGATIVE
Staphylococcus aureus: POSITIVE — AB

## 2020-11-01 LAB — GLUCOSE, CAPILLARY: Glucose-Capillary: 163 mg/dL — ABNORMAL HIGH (ref 70–99)

## 2020-11-01 LAB — SARS CORONAVIRUS 2 (TAT 6-24 HRS): SARS Coronavirus 2: NEGATIVE

## 2020-11-01 NOTE — Progress Notes (Signed)
PCP - Dr. Garret Reddish Cardiologist - Dr. Olin Pia  Chest x-ray - n/a  EKG - 11/01/20 Stress Test - denies ECHO - 2008-Epic Cardiac Cath - denies  Sleep Study - denies CPAP - denies  Does not check CBG at home CBG at PAT 163 A1C 6.3 on 09/24/20  Blood Thinner Instructions: n/a Aspirin Instructions:n/a  COVID TEST- 11/01/20; pt aware of quarantine guidelines  Anesthesia review: Yes, heart history  Patient denies shortness of breath, fever, cough and chest pain at PAT appointment   All instructions explained to the patient, with a verbal understanding of the material. Patient agrees to go over the instructions while at home for a better understanding. Patient also instructed to self quarantine after being tested for COVID-19. The opportunity to ask questions was provided.

## 2020-11-01 NOTE — Pre-Procedure Instructions (Signed)
Surgical Instructions    Your procedure is scheduled on Monday, March 21st.  Report to [facility] Main Entrance "A" at 11:30 A.M., then check in with the Admitting office.  Call this number if you have problems the morning of surgery:  [phone]   If you have any questions prior to your surgery date call [phone]: Open Monday-Friday 8am-4pm    Remember:  Do not eat or drink after midnight the night before your surgery    Take these medicines the morning of surgery with A SIP OF WATER  fexofenadine (ALLEGRA)  methylPREDNISolone (MEDROL DOSEPAK)    Take these medications as needed the morning of surgery   acetaminophen (TYLENOL)  diphenhydrAMINE (BENADRYL HYDROcodone-acetaminophen (NORCO/VICODIN)  loperamide (IMODIUM) traMADol (ULTRAM)   As of today, STOP taking any Aspirin (unless otherwise instructed by your surgeon) Aleve, Naproxen, Ibuprofen, Motrin, Advil, Goody's, BC's, all herbal medications, fish oil, and all vitamins. This includes: meloxicam (MOBIC).                      Do not wear jewelry, make up, or nail polish            Do not wear lotions, powders, colognes, or deodorant.            Men may shave face and neck.            Do not bring valuables to the hospital.            [facility] is not responsible for any belongings or valuables.  Do NOT Smoke (Tobacco/Vaping) or drink Alcohol 24 hours prior to your procedure If you use a CPAP at night, you may bring all equipment for your overnight stay.   Contacts, glasses, dentures or bridgework may not be worn into surgery, please bring cases for these belongings   For patients admitted to the hospital, discharge time will be determined by your treatment team.   Patients discharged the day of surgery will not be allowed to drive home, and someone needs to stay with them for 24 hours.    Special instructions:   [facility]- Preparing For Surgery  Before surgery, you can play an important role. Because  skin is not sterile, your skin needs to be as free of germs as possible. You can reduce the number of germs on your skin by washing with CHG (chlorahexidine gluconate) Soap before surgery.  CHG is an antiseptic cleaner which kills germs and bonds with the skin to continue killing germs even after washing.    Oral Hygiene is also important to reduce your risk of infection.  Remember - BRUSH YOUR TEETH THE MORNING OF SURGERY WITH YOUR REGULAR TOOTHPASTE  Please do not use if you have an allergy to CHG or antibacterial soaps. If your skin becomes reddened/irritated stop using the CHG.  Do not shave (including legs and underarms) for at least 48 hours prior to first CHG shower. It is OK to shave your face.  Please follow these instructions carefully.   1. Shower the NIGHT BEFORE SURGERY and the MORNING OF SURGERY  2. If you chose to wash your hair, wash your hair first as usual with your normal shampoo.  3. After you shampoo, rinse your hair and body thoroughly to remove the shampoo.  4. Use CHG Soap as you would any other liquid soap. You can apply CHG directly to the skin and wash gently with a scrungie or a clean washcloth.   5. Apply the  CHG Soap to your body ONLY FROM THE NECK DOWN.  Do not use on open wounds or open sores. Avoid contact with your eyes, ears, mouth and genitals (private parts). Wash Face and genitals (private parts)  with your normal soap.   6. Wash thoroughly, paying special attention to the area where your surgery will be performed.  7. Thoroughly rinse your body with warm water from the neck down.  8. DO NOT shower/wash with your normal soap after using and rinsing off the CHG Soap.  9. Pat yourself dry with a CLEAN TOWEL.  10. Wear CLEAN PAJAMAS to bed the night before surgery  11. Place CLEAN SHEETS on your bed the night before your surgery  12. DO NOT SLEEP WITH PETS.   Day of Surgery: Shower with CHG soap. Wear Clean/Comfortable clothing the morning of  surgery Do not apply any deodorants/lotions.   Remember to brush your teeth WITH YOUR REGULAR TOOTHPASTE.   Please read over the following fact sheets that you were given.

## 2020-11-01 NOTE — Progress Notes (Incomplete)
Surgical Instructions    Your procedure is scheduled on Monday, March 21st, 2022  Report to [place] Entrance "A" at *** A.M., then check in with the Admitting office.  Call this number if you have problems the morning of surgery:  [phone]   If you have any questions prior to your surgery date call [phone]: Open Monday-Friday 8am-4pm    Remember:  Do not eat or drink after midnight the night before your surgery.              Take these medicines the morning of surgery with A SIP OF WATER                        fexofenadine (ALLEGRA)          methylPREDNISolone (MEDROL DOSEPAK)               Take these medicines if needed the morning of surgery              acetaminophen (TYLENOL)             diphenhydrAMINE (BENADRYL)             HYDROcodone-acetaminophen (NORCO/VICODIN)             traMADol (ULTRAM)  As of today, STOP taking any Aspirin (unless otherwise instructed by your surgeon) Aleve, Naproxen, Ibuprofen, Motrin, Advil, Goody's, BC's, all herbal medications, fish oil, and all vitamins.                     Do not wear jewelry, make up, or nail polish            Do not wear lotions, powders, cologne, or deodorant.            Men may shave face and neck.             Do not bring valuables to the hospital.            [facility] is not responsible for any belongings or valuables.  Do NOT Smoke (Tobacco/Vaping) or drink Alcohol 24 hours prior to your procedure If you use a CPAP at night, you may bring all equipment for your overnight stay.   Contacts, glasses, dentures or bridgework may not be worn into surgery, please bring cases for these belongings   For patients admitted to the hospital, discharge time will be determined by your treatment team.   Patients discharged the day of surgery will not be allowed to drive home, and someone needs to stay with them for 24 hours.    Special instructions:   [facility]- Preparing For Surgery  Before surgery,  you can play an important role. Because skin is not sterile, your skin needs to be as free of germs as possible. You can reduce the number of germs on your skin by washing with CHG (chlorahexidine gluconate) Soap before surgery.  CHG is an antiseptic cleaner which kills germs and bonds with the skin to continue killing germs even after washing.    Oral Hygiene is also important to reduce your risk of infection.  Remember - BRUSH YOUR TEETH THE MORNING OF SURGERY WITH YOUR REGULAR TOOTHPASTE  Please do not use if you have an allergy to CHG or antibacterial soaps. If your skin becomes reddened/irritated stop using the CHG.  Do not shave (including legs and underarms) for at least 48 hours prior to first CHG shower. It is OK to shave  your face.  Please follow these instructions carefully.   1. Shower the NIGHT BEFORE SURGERY and the MORNING OF SURGERY  2. If you chose to wash your hair, wash your hair first as usual with your normal shampoo.  3. After you shampoo, rinse your hair and body thoroughly to remove the shampoo.  4. Use CHG Soap as you would any other liquid soap. You can apply CHG directly to the skin and wash gently with a scrungie or a clean washcloth.   5. Apply the CHG Soap to your body ONLY FROM THE NECK DOWN.  Do not use on open wounds or open sores. Avoid contact with your eyes, ears, mouth and genitals (private parts). Wash Face and genitals (private parts)  with your normal soap.   6. Wash thoroughly, paying special attention to the area where your surgery will be performed.  7. Thoroughly rinse your body with warm water from the neck down.  8. DO NOT shower/wash with your normal soap after using and rinsing off the CHG Soap.  9. Pat yourself dry with a CLEAN TOWEL.  10. Wear CLEAN PAJAMAS to bed the night before surgery  11. Place CLEAN SHEETS on your bed the night before your surgery  12. DO NOT SLEEP WITH PETS.   Day of Surgery: Shower with CHG soap. Wear  Clean/Comfortable clothing the morning of surgery Do not apply any deodorants/lotions.   Remember to brush your teeth WITH YOUR REGULAR TOOTHPASTE.   Please read over the following fact sheets that you were given.

## 2020-11-04 ENCOUNTER — Ambulatory Visit (HOSPITAL_COMMUNITY): Payer: Medicare Other

## 2020-11-04 ENCOUNTER — Other Ambulatory Visit: Payer: Self-pay

## 2020-11-04 ENCOUNTER — Ambulatory Visit (HOSPITAL_COMMUNITY)
Admission: RE | Disposition: A | Discharge status: Home / Self Care | Payer: Self-pay | Source: Home / Self Care | Attending: Neurosurgery

## 2020-11-04 ENCOUNTER — Observation Stay (HOSPITAL_COMMUNITY)
Admission: RE | Admit: 2020-11-04 | Discharge: 2020-11-05 | Disposition: A | Discharge status: Home / Self Care | Payer: Medicare Other | Attending: Neurosurgery | Admitting: Neurosurgery

## 2020-11-04 ENCOUNTER — Ambulatory Visit (HOSPITAL_COMMUNITY): Payer: Medicare Other | Admitting: Certified Registered"

## 2020-11-04 ENCOUNTER — Ambulatory Visit (HOSPITAL_COMMUNITY): Payer: Medicare Other | Admitting: Physician Assistant

## 2020-11-04 ENCOUNTER — Encounter (HOSPITAL_COMMUNITY): Payer: Self-pay | Admitting: Neurosurgery

## 2020-11-04 DIAGNOSIS — Z87891 Personal history of nicotine dependence: Secondary | ICD-10-CM | POA: Diagnosis not present

## 2020-11-04 DIAGNOSIS — M5412 Radiculopathy, cervical region: Secondary | ICD-10-CM | POA: Diagnosis not present

## 2020-11-04 DIAGNOSIS — M4712 Other spondylosis with myelopathy, cervical region: Secondary | ICD-10-CM | POA: Diagnosis not present

## 2020-11-04 DIAGNOSIS — Z791 Long term (current) use of non-steroidal anti-inflammatories (NSAID): Secondary | ICD-10-CM | POA: Diagnosis not present

## 2020-11-04 DIAGNOSIS — K219 Gastro-esophageal reflux disease without esophagitis: Secondary | ICD-10-CM | POA: Diagnosis not present

## 2020-11-04 DIAGNOSIS — E785 Hyperlipidemia, unspecified: Secondary | ICD-10-CM | POA: Diagnosis not present

## 2020-11-04 DIAGNOSIS — Z79891 Long term (current) use of opiate analgesic: Secondary | ICD-10-CM | POA: Diagnosis not present

## 2020-11-04 DIAGNOSIS — I10 Essential (primary) hypertension: Secondary | ICD-10-CM | POA: Insufficient documentation

## 2020-11-04 DIAGNOSIS — M4802 Spinal stenosis, cervical region: Secondary | ICD-10-CM | POA: Diagnosis not present

## 2020-11-04 DIAGNOSIS — Z981 Arthrodesis status: Secondary | ICD-10-CM | POA: Diagnosis not present

## 2020-11-04 DIAGNOSIS — M4722 Other spondylosis with radiculopathy, cervical region: Secondary | ICD-10-CM | POA: Diagnosis present

## 2020-11-04 DIAGNOSIS — Z79899 Other long term (current) drug therapy: Secondary | ICD-10-CM | POA: Insufficient documentation

## 2020-11-04 DIAGNOSIS — Z419 Encounter for procedure for purposes other than remedying health state, unspecified: Secondary | ICD-10-CM

## 2020-11-04 HISTORY — PX: POSTERIOR CERVICAL LAMINECTOMY: SHX2248

## 2020-11-04 LAB — GLUCOSE, CAPILLARY: Glucose-Capillary: 140 mg/dL — ABNORMAL HIGH (ref 70–99)

## 2020-11-04 SURGERY — POSTERIOR CERVICAL LAMINECTOMY
Anesthesia: General | Site: Neck | Laterality: Left

## 2020-11-04 MED ORDER — THROMBIN 5000 UNITS EX KIT
PACK | CUTANEOUS | Status: AC
  Filled 2020-11-04: qty 1

## 2020-11-04 MED ORDER — OMEGA-3-ACID ETHYL ESTERS 1 G PO CAPS
1.0000 g | ORAL_CAPSULE | Freq: Every day | ORAL | Status: DC
  Administered 2020-11-04 – 2020-11-05 (×2): 1 g via ORAL
  Filled 2020-11-04 (×2): qty 1

## 2020-11-04 MED ORDER — MIDAZOLAM HCL 2 MG/2ML IJ SOLN
INTRAMUSCULAR | Status: AC
  Filled 2020-11-04: qty 2

## 2020-11-04 MED ORDER — CEFAZOLIN SODIUM-DEXTROSE 1-4 GM/50ML-% IV SOLN
1.0000 g | Freq: Three times a day (TID) | INTRAVENOUS | Status: AC
  Administered 2020-11-04 – 2020-11-05 (×2): 1 g via INTRAVENOUS
  Filled 2020-11-04 (×2): qty 50

## 2020-11-04 MED ORDER — ONDANSETRON HCL 4 MG/2ML IJ SOLN: 4.0000 mg | Freq: Four times a day (QID) | INTRAMUSCULAR | Status: DC | PRN

## 2020-11-04 MED ORDER — ONDANSETRON HCL 4 MG PO TABS: 4.0000 mg | ORAL_TABLET | Freq: Four times a day (QID) | ORAL | Status: DC | PRN

## 2020-11-04 MED ORDER — ONDANSETRON HCL 4 MG/2ML IJ SOLN
INTRAMUSCULAR | Status: DC | PRN
  Administered 2020-11-04: 4 mg via INTRAVENOUS

## 2020-11-04 MED ORDER — PHENOL 1.4 % MT LIQD: 1.0000 | OROMUCOSAL | Status: DC | PRN

## 2020-11-04 MED ORDER — MELOXICAM 7.5 MG PO TABS
15.0000 mg | ORAL_TABLET | Freq: Every day | ORAL | Status: DC
  Administered 2020-11-04 – 2020-11-05 (×2): 15 mg via ORAL
  Filled 2020-11-04 (×2): qty 2

## 2020-11-04 MED ORDER — CEFAZOLIN SODIUM-DEXTROSE 2-4 GM/100ML-% IV SOLN
INTRAVENOUS | Status: AC
  Filled 2020-11-04: qty 100

## 2020-11-04 MED ORDER — CHLORHEXIDINE GLUCONATE CLOTH 2 % EX PADS: 6.0000 | MEDICATED_PAD | Freq: Once | CUTANEOUS | Status: DC

## 2020-11-04 MED ORDER — HYDROCODONE-ACETAMINOPHEN 10-325 MG PO TABS
2.0000 | ORAL_TABLET | ORAL | Status: DC | PRN
  Administered 2020-11-04 – 2020-11-05 (×4): 2 via ORAL
  Filled 2020-11-04 (×4): qty 2

## 2020-11-04 MED ORDER — 0.9 % SODIUM CHLORIDE (POUR BTL) OPTIME
TOPICAL | Status: DC | PRN
  Administered 2020-11-04: 1000 mL

## 2020-11-04 MED ORDER — ACETAMINOPHEN 10 MG/ML IV SOLN: 1000.0000 mg | Freq: Once | INTRAVENOUS | Status: DC | PRN

## 2020-11-04 MED ORDER — THROMBIN 20000 UNITS EX SOLR: CUTANEOUS | Status: DC | PRN

## 2020-11-04 MED ORDER — ROCURONIUM BROMIDE 10 MG/ML (PF) SYRINGE
PREFILLED_SYRINGE | INTRAVENOUS | Status: DC | PRN
  Administered 2020-11-04: 60 mg via INTRAVENOUS
  Administered 2020-11-04: 40 mg via INTRAVENOUS

## 2020-11-04 MED ORDER — FENTANYL CITRATE (PF) 250 MCG/5ML IJ SOLN
INTRAMUSCULAR | Status: AC
  Filled 2020-11-04: qty 5

## 2020-11-04 MED ORDER — ASCORBIC ACID 500 MG PO TABS
500.0000 mg | ORAL_TABLET | Freq: Every day | ORAL | Status: DC
  Administered 2020-11-04 – 2020-11-05 (×2): 500 mg via ORAL
  Filled 2020-11-04 (×2): qty 1

## 2020-11-04 MED ORDER — BUPIVACAINE HCL (PF) 0.25 % IJ SOLN
INTRAMUSCULAR | Status: AC
  Filled 2020-11-04: qty 30

## 2020-11-04 MED ORDER — CYCLOBENZAPRINE HCL 10 MG PO TABS
10.0000 mg | ORAL_TABLET | Freq: Three times a day (TID) | ORAL | Status: DC | PRN
Start: 2020-11-04 — End: 2020-11-05
  Administered 2020-11-04 – 2020-11-05 (×2): 10 mg via ORAL
  Filled 2020-11-04 (×2): qty 1

## 2020-11-04 MED ORDER — MELATONIN 5 MG PO TABS
10.0000 mg | ORAL_TABLET | Freq: Every day | ORAL | Status: DC | PRN
  Administered 2020-11-04: 10 mg via ORAL
  Filled 2020-11-04: qty 2

## 2020-11-04 MED ORDER — SODIUM CHLORIDE 0.9% FLUSH: 3.0000 mL | Freq: Two times a day (BID) | INTRAVENOUS | Status: DC

## 2020-11-04 MED ORDER — LACTATED RINGERS IV SOLN: INTRAVENOUS | Status: DC

## 2020-11-04 MED ORDER — KETOROLAC TROMETHAMINE 15 MG/ML IJ SOLN
30.0000 mg | Freq: Four times a day (QID) | INTRAMUSCULAR | Status: DC
  Administered 2020-11-04 – 2020-11-05 (×3): 30 mg via INTRAVENOUS
  Filled 2020-11-04 (×3): qty 2

## 2020-11-04 MED ORDER — ACETAMINOPHEN 325 MG PO TABS: 650.0000 mg | ORAL_TABLET | ORAL | Status: DC | PRN

## 2020-11-04 MED ORDER — BACITRACIN ZINC 500 UNIT/GM EX OINT
TOPICAL_OINTMENT | CUTANEOUS | Status: DC | PRN
  Administered 2020-11-04: 1 via TOPICAL

## 2020-11-04 MED ORDER — SODIUM CHLORIDE 0.9% FLUSH: 3.0000 mL | INTRAVENOUS | Status: DC | PRN

## 2020-11-04 MED ORDER — LOPERAMIDE HCL 2 MG PO CAPS
2.0000 mg | ORAL_CAPSULE | Freq: Every day | ORAL | Status: DC | PRN
  Filled 2020-11-04: qty 1

## 2020-11-04 MED ORDER — HYDROMORPHONE HCL 1 MG/ML IJ SOLN
0.2500 mg | INTRAMUSCULAR | Status: DC | PRN
Start: 2020-11-04 — End: 2020-11-04
  Administered 2020-11-04: 0.5 mg via INTRAVENOUS

## 2020-11-04 MED ORDER — ACETAMINOPHEN 650 MG RE SUPP: 650.0000 mg | RECTAL | Status: DC | PRN

## 2020-11-04 MED ORDER — MIDAZOLAM HCL 5 MG/5ML IJ SOLN
INTRAMUSCULAR | Status: DC | PRN
  Administered 2020-11-04: 2 mg via INTRAVENOUS

## 2020-11-04 MED ORDER — SODIUM CHLORIDE 0.9 % IV SOLN
250.0000 mL | INTRAVENOUS | Status: DC
  Administered 2020-11-04: 250 mL via INTRAVENOUS

## 2020-11-04 MED ORDER — POTASSIUM 99 MG PO TABS: 99.0000 mg | ORAL_TABLET | Freq: Every day | ORAL | Status: DC

## 2020-11-04 MED ORDER — FENTANYL CITRATE (PF) 100 MCG/2ML IJ SOLN
INTRAMUSCULAR | Status: DC | PRN
  Administered 2020-11-04: 50 ug via INTRAVENOUS
  Administered 2020-11-04: 100 ug via INTRAVENOUS

## 2020-11-04 MED ORDER — THROMBIN 20000 UNITS EX SOLR
CUTANEOUS | Status: AC
  Filled 2020-11-04: qty 20000

## 2020-11-04 MED ORDER — BUPIVACAINE HCL (PF) 0.25 % IJ SOLN
INTRAMUSCULAR | Status: DC | PRN
  Administered 2020-11-04: 20 mL

## 2020-11-04 MED ORDER — CEFAZOLIN SODIUM-DEXTROSE 2-4 GM/100ML-% IV SOLN
2.0000 g | INTRAVENOUS | Status: AC
  Administered 2020-11-04: 2 g via INTRAVENOUS

## 2020-11-04 MED ORDER — CHLORHEXIDINE GLUCONATE 0.12 % MT SOLN
OROMUCOSAL | Status: AC
  Administered 2020-11-04: 15 mL via OROMUCOSAL
  Filled 2020-11-04: qty 15

## 2020-11-04 MED ORDER — OXYCODONE HCL 5 MG/5ML PO SOLN
5.0000 mg | Freq: Once | ORAL | Status: AC | PRN
Start: 2020-11-04 — End: 2020-11-04

## 2020-11-04 MED ORDER — HYDROCODONE-ACETAMINOPHEN 5-325 MG PO TABS: 1.0000 | ORAL_TABLET | ORAL | Status: DC | PRN

## 2020-11-04 MED ORDER — THROMBIN 5000 UNITS EX SOLR: OROMUCOSAL | Status: DC | PRN

## 2020-11-04 MED ORDER — ORAL CARE MOUTH RINSE: 15.0000 mL | Freq: Once | OROMUCOSAL | Status: AC

## 2020-11-04 MED ORDER — BACITRACIN ZINC 500 UNIT/GM EX OINT
TOPICAL_OINTMENT | CUTANEOUS | Status: AC
  Filled 2020-11-04: qty 28.35

## 2020-11-04 MED ORDER — PHENYLEPHRINE 40 MCG/ML (10ML) SYRINGE FOR IV PUSH (FOR BLOOD PRESSURE SUPPORT)
PREFILLED_SYRINGE | INTRAVENOUS | Status: DC | PRN
  Administered 2020-11-04: 40 ug via INTRAVENOUS

## 2020-11-04 MED ORDER — LORATADINE 10 MG PO TABS
10.0000 mg | ORAL_TABLET | Freq: Every day | ORAL | Status: DC
  Administered 2020-11-05: 10 mg via ORAL
  Filled 2020-11-04: qty 1

## 2020-11-04 MED ORDER — MENTHOL 3 MG MT LOZG: 1.0000 | LOZENGE | OROMUCOSAL | Status: DC | PRN

## 2020-11-04 MED ORDER — PROPOFOL 10 MG/ML IV BOLUS
INTRAVENOUS | Status: AC
  Filled 2020-11-04: qty 20

## 2020-11-04 MED ORDER — DEXAMETHASONE SODIUM PHOSPHATE 10 MG/ML IJ SOLN
10.0000 mg | Freq: Once | INTRAMUSCULAR | Status: AC
  Administered 2020-11-04: 10 mg via INTRAVENOUS

## 2020-11-04 MED ORDER — DIPHENHYDRAMINE HCL 25 MG PO CAPS: 25.0000 mg | ORAL_CAPSULE | Freq: Four times a day (QID) | ORAL | Status: DC | PRN

## 2020-11-04 MED ORDER — OXYCODONE HCL 5 MG PO TABS
5.0000 mg | ORAL_TABLET | Freq: Once | ORAL | Status: AC | PRN
  Administered 2020-11-04: 5 mg via ORAL

## 2020-11-04 MED ORDER — PROPOFOL 10 MG/ML IV BOLUS
INTRAVENOUS | Status: DC | PRN
  Administered 2020-11-04: 150 mg via INTRAVENOUS

## 2020-11-04 MED ORDER — LIDOCAINE 2% (20 MG/ML) 5 ML SYRINGE
INTRAMUSCULAR | Status: DC | PRN
  Administered 2020-11-04: 80 mg via INTRAVENOUS

## 2020-11-04 MED ORDER — MAGNESIUM OXIDE 400 (241.3 MG) MG PO TABS
400.0000 mg | ORAL_TABLET | Freq: Every day | ORAL | Status: DC
  Administered 2020-11-05: 400 mg via ORAL
  Filled 2020-11-04: qty 1

## 2020-11-04 MED ORDER — LISINOPRIL 10 MG PO TABS
10.0000 mg | ORAL_TABLET | Freq: Every day | ORAL | Status: DC
Start: 2020-11-04 — End: 2020-11-05
  Administered 2020-11-05: 10 mg via ORAL
  Filled 2020-11-04: qty 1

## 2020-11-04 MED ORDER — HYDROMORPHONE HCL 1 MG/ML IJ SOLN
INTRAMUSCULAR | Status: AC
  Administered 2020-11-04: 0.5 mg via INTRAVENOUS
  Filled 2020-11-04: qty 1

## 2020-11-04 MED ORDER — PROPOFOL 1000 MG/100ML IV EMUL
INTRAVENOUS | Status: AC
  Filled 2020-11-04: qty 100

## 2020-11-04 MED ORDER — PROMETHAZINE HCL 25 MG/ML IJ SOLN: 6.2500 mg | INTRAMUSCULAR | Status: DC | PRN

## 2020-11-04 MED ORDER — ADULT MULTIVITAMIN W/MINERALS CH
1.0000 | ORAL_TABLET | Freq: Every day | ORAL | Status: DC
  Administered 2020-11-04 – 2020-11-05 (×2): 1 via ORAL
  Filled 2020-11-04 (×2): qty 1

## 2020-11-04 MED ORDER — SODIUM CHLORIDE 0.9 % IV SOLN
0.0125 ug/kg/min | INTRAVENOUS | Status: AC
  Administered 2020-11-04: .1 ug/kg/min via INTRAVENOUS
  Filled 2020-11-04: qty 2000

## 2020-11-04 MED ORDER — TRAMADOL HCL 50 MG PO TABS: 50.0000 mg | ORAL_TABLET | Freq: Four times a day (QID) | ORAL | Status: DC | PRN

## 2020-11-04 MED ORDER — HYDROMORPHONE HCL 1 MG/ML IJ SOLN
1.0000 mg | INTRAMUSCULAR | Status: DC | PRN
  Administered 2020-11-04 – 2020-11-05 (×2): 1 mg via INTRAVENOUS
  Filled 2020-11-04 (×2): qty 1

## 2020-11-04 MED ORDER — ESOMEPRAZOLE MAGNESIUM 20 MG PO CPDR
20.0000 mg | DELAYED_RELEASE_CAPSULE | Freq: Every day | ORAL | Status: DC
  Administered 2020-11-05: 20 mg via ORAL
  Filled 2020-11-04 (×2): qty 1

## 2020-11-04 MED ORDER — DEXAMETHASONE SODIUM PHOSPHATE 10 MG/ML IJ SOLN
INTRAMUSCULAR | Status: AC
  Filled 2020-11-04: qty 1

## 2020-11-04 MED ORDER — MAGNESIUM 200 MG PO TABS: 500.0000 mg | ORAL_TABLET | Freq: Every day | ORAL | Status: DC

## 2020-11-04 MED ORDER — CHLORHEXIDINE GLUCONATE 0.12 % MT SOLN: 15.0000 mL | Freq: Once | OROMUCOSAL | Status: AC

## 2020-11-04 MED ORDER — ALBUMIN HUMAN 5 % IV SOLN: INTRAVENOUS | Status: DC | PRN

## 2020-11-04 MED ORDER — OXYCODONE HCL 5 MG PO TABS
ORAL_TABLET | ORAL | Status: AC
  Filled 2020-11-04: qty 1

## 2020-11-04 MED ORDER — OMEGA-3 1000 MG PO CAPS: 1000.0000 mg | ORAL_CAPSULE | Freq: Every day | ORAL | Status: DC

## 2020-11-04 MED ORDER — SUGAMMADEX SODIUM 200 MG/2ML IV SOLN
INTRAVENOUS | Status: DC | PRN
  Administered 2020-11-04: 200 mg via INTRAVENOUS

## 2020-11-04 MED ORDER — PHENYLEPHRINE HCL-NACL 10-0.9 MG/250ML-% IV SOLN
INTRAVENOUS | Status: DC | PRN
  Administered 2020-11-04: 25 ug/min via INTRAVENOUS

## 2020-11-04 SURGICAL SUPPLY — 54 items
ADH SKN CLS APL DERMABOND .7 (GAUZE/BANDAGES/DRESSINGS) ×1
APL SKNCLS STERI-STRIP NONHPOA (GAUZE/BANDAGES/DRESSINGS) ×1
BAG DECANTER FOR FLEXI CONT (MISCELLANEOUS) ×1 IMPLANT
BAND INSRT 18 STRL LF DISP RB (MISCELLANEOUS) ×2
BAND RUBBER #18 3X1/16 STRL (MISCELLANEOUS) ×2 IMPLANT
BENZOIN TINCTURE PRP APPL 2/3 (GAUZE/BANDAGES/DRESSINGS) ×2 IMPLANT
BLADE CLIPPER SURG (BLADE) IMPLANT
BUR MATCHSTICK NEURO 3.0 LAGG (BURR) ×2 IMPLANT
CANISTER SUCT 3000ML PPV (MISCELLANEOUS) ×2 IMPLANT
CARTRIDGE OIL MAESTRO DRILL (MISCELLANEOUS) ×1 IMPLANT
COVER WAND RF STERILE (DRAPES) ×1 IMPLANT
DERMABOND ADVANCED (GAUZE/BANDAGES/DRESSINGS) ×1
DERMABOND ADVANCED .7 DNX12 (GAUZE/BANDAGES/DRESSINGS) ×1 IMPLANT
DIFFUSER DRILL AIR PNEUMATIC (MISCELLANEOUS) ×2 IMPLANT
DRAPE LAPAROTOMY 100X72 PEDS (DRAPES) ×2 IMPLANT
DRAPE MICROSCOPE LEICA (MISCELLANEOUS) ×1 IMPLANT
DRSG OPSITE POSTOP 3X4 (GAUZE/BANDAGES/DRESSINGS) ×1 IMPLANT
DURAPREP 26ML APPLICATOR (WOUND CARE) ×2 IMPLANT
ELECT REM PT RETURN 9FT ADLT (ELECTROSURGICAL) ×2
ELECTRODE REM PT RTRN 9FT ADLT (ELECTROSURGICAL) ×1 IMPLANT
GAUZE 4X4 16PLY RFD (DISPOSABLE) IMPLANT
GAUZE SPONGE 4X4 12PLY STRL (GAUZE/BANDAGES/DRESSINGS) ×2 IMPLANT
GLOVE ECLIPSE 9.0 STRL (GLOVE) ×2 IMPLANT
GLOVE EXAM NITRILE XL STR (GLOVE) IMPLANT
GLOVE SURG POLYISO LF SZ6 (GLOVE) ×1 IMPLANT
GLOVE SURG POLYISO LF SZ7 (GLOVE) ×3 IMPLANT
GLOVE SURG UNDER POLY LF SZ7.5 (GLOVE) ×2 IMPLANT
GOWN STRL REUS W/ TWL LRG LVL3 (GOWN DISPOSABLE) IMPLANT
GOWN STRL REUS W/ TWL XL LVL3 (GOWN DISPOSABLE) ×1 IMPLANT
GOWN STRL REUS W/TWL 2XL LVL3 (GOWN DISPOSABLE) IMPLANT
GOWN STRL REUS W/TWL LRG LVL3 (GOWN DISPOSABLE) ×2
GOWN STRL REUS W/TWL XL LVL3 (GOWN DISPOSABLE) ×4
HEMOSTAT POWDER KIT SURGIFOAM (HEMOSTASIS) ×1 IMPLANT
KIT BASIN OR (CUSTOM PROCEDURE TRAY) ×2 IMPLANT
KIT TURNOVER KIT B (KITS) ×2 IMPLANT
NDL SPNL 22GX3.5 QUINCKE BK (NEEDLE) ×1 IMPLANT
NEEDLE HYPO 22GX1.5 SAFETY (NEEDLE) ×2 IMPLANT
NEEDLE SPNL 22GX3.5 QUINCKE BK (NEEDLE) ×2 IMPLANT
NS IRRIG 1000ML POUR BTL (IV SOLUTION) ×2 IMPLANT
OIL CARTRIDGE MAESTRO DRILL (MISCELLANEOUS) ×2
PACK LAMINECTOMY NEURO (CUSTOM PROCEDURE TRAY) ×2 IMPLANT
PAD ARMBOARD 7.5X6 YLW CONV (MISCELLANEOUS) ×6 IMPLANT
PIN MAYFIELD SKULL DISP (PIN) ×2 IMPLANT
SPONGE LAP 4X18 RFD (DISPOSABLE) IMPLANT
SPONGE SURGIFOAM ABS GEL 100 (HEMOSTASIS) ×1 IMPLANT
SPONGE SURGIFOAM ABS GEL SZ50 (HEMOSTASIS) ×1 IMPLANT
STRIP CLOSURE SKIN 1/2X4 (GAUZE/BANDAGES/DRESSINGS) ×2 IMPLANT
SUT VIC AB 0 CT1 18XCR BRD8 (SUTURE) ×1 IMPLANT
SUT VIC AB 0 CT1 8-18 (SUTURE) ×2
SUT VIC AB 2-0 CT1 18 (SUTURE) ×2 IMPLANT
SUT VIC AB 3-0 SH 8-18 (SUTURE) ×2 IMPLANT
TOWEL GREEN STERILE (TOWEL DISPOSABLE) ×2 IMPLANT
TOWEL GREEN STERILE FF (TOWEL DISPOSABLE) ×2 IMPLANT
WATER STERILE IRR 1000ML POUR (IV SOLUTION) ×2 IMPLANT

## 2020-11-04 NOTE — Anesthesia Procedure Notes (Signed)
Procedure Name: Intubation Date/Time: 11/04/2020 1:37 PM Performed by: Georgia Duff, CRNA Pre-anesthesia Checklist: Patient identified, Emergency Drugs available, Suction available and Patient being monitored Patient Re-evaluated:Patient Re-evaluated prior to induction Oxygen Delivery Method: Circle System Utilized Preoxygenation: Pre-oxygenation with 100% oxygen Induction Type: IV induction Ventilation: Mask ventilation without difficulty Laryngoscope Size: Miller and 2 Grade View: Grade I Tube type: Oral Tube size: 7.5 mm Number of attempts: 1 Airway Equipment and Method: Stylet and Oral airway Placement Confirmation: ETT inserted through vocal cords under direct vision,  positive ETCO2 and breath sounds checked- equal and bilateral Secured at: 22 cm Tube secured with: Tape Dental Injury: Teeth and Oropharynx as per pre-operative assessment

## 2020-11-04 NOTE — Op Note (Signed)
Date of procedure: 11/04/2020  Date of dictation: Same  Service: Neurosurgery  Preoperative diagnosis: Cervical stenosis with myelopathy and radiculopathy  Postoperative diagnosis: Same  Procedure Name: Left C4-5 decompressive hemilaminectomy and left C5 foraminotomy  Surgeon:Saxon Crosby A.Abie Killian, M.D.  Asst. 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  Patient with increasing neck pain and left upper extremity symptoms consistent primarily of a left-sided C5 radiculopathy but also with some elements for cervical myelopathy as well.  Work-up demonstrates evidence of marked multilevel cervical degenerative disease with spontaneous fusion at C4-5 and C5-6.  Patient has severe left-sided spondylosis and stenosis with cord compression and some signal change at C4-5.  He has severe left-sided C5 neural foraminal stenosis.  Patient presents now for decompressive surgery in hopes of improving his symptoms.  Operative note: After induction anesthesia, patient position prone onto bolsters with his head fixed in [place] pin headrest.  Patient's posterior cervical region prepped and draped sterilely.  Incision made overlying C4-5.  Dissection performed on the left.  Retractor placed.  X-ray taken in the C3-4 level was identified.  Dissection was redirected 1 level caudally.  Retractor was replaced.  Laminotomy and medial facetectomy were then performed using high-speed drill and Kerrison rongeurs to remove the entire hemilamina of C4 on the left and the superior two thirds of the hemilamina of C5 on the left.  Medial facetectomies performed and an aggressive left-sided C5 foraminotomy was performed overlying the entire length of the exiting C5 nerve root.  There was no evidence of injury to thecal sac or nerve roots.  There is no evidence any residual compression.  Wound was then irrigated.   Hemostasis was achieved.  Wound was then closed in layers with Vicryl sutures.  Steri-Strips and sterile dressing were applied.  No apparent complications.  Patient tolerated the procedure well he returned to recovery room postop.

## 2020-11-04 NOTE — Anesthesia Preprocedure Evaluation (Addendum)
Anesthesia Evaluation  Patient identified by MRN, date of birth, ID band Patient awake    Reviewed: Allergy & Precautions, NPO status , Patient's Chart, lab work & pertinent test results  Airway Mallampati: II  TM Distance: >3 FB Neck ROM: Full    Dental  (+) Partial Lower, Partial Upper Upper partial fused :   Pulmonary neg pulmonary ROS, former smoker,    Pulmonary exam normal        Cardiovascular hypertension,  Rhythm:Regular Rate:Normal     Neuro/Psych negative neurological ROS  negative psych ROS   GI/Hepatic Neg liver ROS, GERD  Medicated,  Endo/Other  negative endocrine ROS  Renal/GU Renal disease  negative genitourinary   Musculoskeletal  (+) Arthritis , Osteoarthritis,    Abdominal (+)  Abdomen: soft. Bowel sounds: normal.  Peds  Hematology negative hematology ROS (+)   Anesthesia Other Findings   Reproductive/Obstetrics                           Anesthesia Physical Anesthesia Plan  ASA: II  Anesthesia Plan: General   Post-op Pain Management:    Induction: Intravenous  PONV Risk Score and Plan: 2 and Ondansetron, Dexamethasone, Midazolam and Treatment may vary due to age or medical condition  Airway Management Planned: Mask and Oral ETT  Additional Equipment:   Intra-op Plan:   Post-operative Plan: Extubation in OR  Informed Consent: I have reviewed the patients History and Physical, chart, labs and discussed the procedure including the risks, benefits and alternatives for the proposed anesthesia with the patient or authorized representative who has indicated his/her understanding and acceptance.     Dental advisory given  Plan Discussed with: CRNA  Anesthesia Plan Comments: (Lab Results      Component                Value               Date                      WBC                      9.4                 11/01/2020                HGB                       18.5 (H)            11/01/2020                HCT                      54.5 (H)            11/01/2020                MCV                      92.7                11/01/2020                PLT                      246  11/01/2020           Lab Results      Component                Value               Date                      NA                       137                 11/01/2020                K                        4.0                 11/01/2020                CO2                      29                  11/01/2020                GLUCOSE                  54 (L)              11/01/2020                BUN                      20                  11/01/2020                CREATININE               0.86                11/01/2020                CALCIUM                  9.3                 11/01/2020                GFRNONAA                 >60                 11/01/2020                GFRAA                    >60                 09/26/2018          )        Anesthesia Quick Evaluation

## 2020-11-04 NOTE — H&P (Signed)
[name] Tosh. is an 55 y.o. male.   Chief Complaint: Neck pain HPI: 55 year old male with progressive neck and left upper extremity symptoms predominantly consistent with a left-sided C5 radiculopathy but with some elements of spinal cord irritation as well.  Work-up demonstrates evidence of loss of normal cervical lordosis with marked multilevel degenerative disease including spontaneous fusion at C4-5 and 6.  Patient with significant left-sided spinal stenosis at C4-5 with compression of the exiting left C5 nerve root.  Patient presents now for decompressive laminotomy and foraminotomy in hopes of improving his symptoms.  Past Medical History:  Diagnosis Date  . AF (atrial fibrillation) ([place]) 2009   nonsustained  . Arthritis   . Blood transfusion without reported diagnosis    1960's/ as baby  . Cardiomyopathy secondary 2009   had ablation  . Chronic pain syndrome    back pain  . Dysrhythmia    states no longer a problem  . Heart murmur    states no longer there  . History of kidney stones   . Hyperlipidemia   . Hypertension   . Inguinal hernia    left  . Nephrolithiasis    2 kidney stones  . Pre-diabetes   . PSVT (paroxysmal supraventricular tachycardia) ([place])    had ablation  . Scoliosis    severe/ has a lot metal in back per pt/     Past Surgical History:  Procedure Laterality Date  . ABDOMINAL EXPOSURE N/A 09/20/2018   Procedure: ABDOMINAL EXPOSURE;  Surgeon: Angelia Mould, MD;  Location: [facility] OR;  Service: Vascular;  Laterality: N/A;  . ANTERIOR LUMBAR FUSION N/A 09/20/2018   Procedure: Lumbar 5 Sacral 1 Anterior lumbar interbody fusion with Lumbar 3 to Iliac fusion with pedicle screws/Lumbar 2-3 Decompressive laminectomy;  Surgeon: Earnie Larsson, MD;  Location: [facility];  Service: Neurosurgery;  Laterality: N/A;  Lumbar 5 Sacral 1 Anterior lumbar interbody fusion with Lumbar 3 to Iliac fusion with pedicle screws/Lumbar 2-3 Decompressive laminectomy  . APPLICATION OF  ROBOTIC ASSISTANCE FOR SPINAL PROCEDURE N/A 09/20/2018   Procedure: APPLICATION OF ROBOTIC ASSISTANCE FOR SPINAL PROCEDURE;  Surgeon: Earnie Larsson, MD;  Location: [facility];  Service: Neurosurgery;  Laterality: N/A;  . AV NODE ABLATION     09/16/07. SVT Dr. Caryl Comes  . BACK SURGERY     01/12/00 and 1982/ has had 6 surgeries on back  . COLONOSCOPY    . INGUINAL HERNIA REPAIR     Left  . LUMBAR WOUND DEBRIDEMENT N/A 09/26/2018   Procedure: LUMBAR WOUND DEBRIDEMENT Placement of lumbar drain;  Surgeon: Earnie Larsson, MD;  Location: [facility];  Service: Neurosurgery;  Laterality: N/A;  LUMBAR WOUND DEBRIDEMENT Placement of lumbar drain  . POSTERIOR LUMBAR FUSION 4 LEVEL N/A 09/20/2018   Procedure: POSTERIOR LUMBAR FUSION 4 LEVEL;  Surgeon: Earnie Larsson, MD;  Location: [facility];  Service: Neurosurgery;  Laterality: N/A;  . REPLACEMENT TOTAL KNEE     1999, L. Murphy/wainer.   Marland Kitchen TOTAL HIP ARTHROPLASTY Right 12/07/2016   Procedure: TOTAL HIP ARTHROPLASTY ANTERIOR APPROACH;  Surgeon: Frederik Pear, MD;  Location: [facility];  Service: Orthopedics;  Laterality: Right;    Family History  Problem Relation Age of Onset  . Breast cancer Mother   . Diabetes Father   . Heart attack Father        late 54s  . Atrial fibrillation Father   . Liver disease Paternal Uncle    Social History:  reports that he quit smoking about 9 years ago.  His smoking use included cigarettes. He has a 2.40 pack-year smoking history. He has never used smokeless tobacco. He reports current alcohol use of about 12.0 standard drinks of alcohol per week. He reports that he does not use drugs.  Allergies: No Known Allergies  Medications Prior to Admission  Medication Sig Dispense Refill  . acetaminophen (TYLENOL) 500 MG tablet Take 1,000 mg by mouth every 6 (six) hours as needed for mild pain or moderate pain.    . diphenhydrAMINE (BENADRYL) 25 mg capsule Take 25 mg by mouth every 6 (six) hours as needed for allergies.    . Esomeprazole Magnesium 20 MG TBEC  Take 20 mg by mouth daily before breakfast.    . fexofenadine (ALLEGRA) 180 MG tablet Take 180 mg by mouth daily.    Marland Kitchen HYDROcodone-acetaminophen (NORCO/VICODIN) 5-325 MG tablet Take 1-2 tablets by mouth every 6 (six) hours as needed for pain.    Marland Kitchen lisinopril (ZESTRIL) 10 MG tablet Take 1 tablet (10 mg total) by mouth daily. 90 tablet 2  . loperamide (IMODIUM) 2 MG capsule Take 2 mg by mouth daily as needed for diarrhea or loose stools.    Marland Kitchen MAGNESIUM PO Take 500 mg by mouth daily.    . Melatonin 10 MG TBDP Take 10 mg by mouth daily as needed (sleep).    . meloxicam (MOBIC) 15 MG tablet Take 15 mg by mouth daily.    . methylPREDNISolone (MEDROL DOSEPAK) 4 MG TBPK tablet Take 4 mg by mouth See admin instructions.    . Multiple Vitamin (MULTIVITAMIN WITH MINERALS) TABS tablet Take 1 tablet by mouth daily. Centrum Silver    . Omega-3 1000 MG CAPS Take 1,000 mg by mouth daily.    . Potassium 99 MG TABS Take 99 mg by mouth daily.    . traMADol (ULTRAM) 50 MG tablet Take 50-100 mg by mouth every 6 (six) hours as needed for pain.    . vitamin C (ASCORBIC ACID) 500 MG tablet Take 500 mg by mouth daily.      Results for orders placed or performed during the hospital encounter of 11/04/20 (from the past 48 hour(s))  Glucose, capillary     Status: Abnormal   Collection Time: 11/04/20 11:45 AM  Result Value Ref Range   Glucose-Capillary 140 (H) 70 - 99 mg/dL    Comment: Glucose reference range applies only to samples taken after fasting for at least 8 hours.   No results found.  Pertinent items noted in HPI and remainder of comprehensive ROS otherwise negative.  Blood pressure (!) 170/105, pulse 85, temperature 98.1 F (36.7 C), temperature source Oral, resp. rate 18, height 5\' 10"  (1.778 m), weight 84.1 kg, SpO2 96 %.  Patient is awake and alert.  He is oriented and appropriate.  Speech is fluent.  Judgment insight are intact.  Cranial nerve function normal bilateral motor examination extremities  reveal some mild weakness of his deltoid and biceps on the left side grading at 4/5.  He has some decrease sensation pinprick and light touch in his left C5 dermatome but also with some decrease sensation in his left C8 dermatome.  Deep nerves are normal active.  No evidence of long track signs.  Gait is reasonably normal.  Posture is mildly flexed peer examination head ears eyes nose throat is unremarkable chest and abdomen are benign.  Extremities are free from injury deformity. Assessment/Plan C4-5 stenosis with radiculopathy.  Plan left C4-5 decompressive laminotomy and foraminotomies.  Risks and benefits been explained.  Patient wishes to proceed.  Cooper Render Serrena Linderman 11/04/2020, 12:37 PM

## 2020-11-04 NOTE — Transfer of Care (Signed)
Immediate Anesthesia Transfer of Care Note  Patient: [name] White.  Procedure(s) Performed: Laminectomy and Foraminotomy - left - Cervical four-five (Left Neck)  Patient Location: PACU  Anesthesia Type:General  Level of Consciousness: awake, alert  and patient cooperative  Airway & Oxygen Therapy: Patient Spontanous Breathing  Post-op Assessment: Report given to RN and Post -op Vital signs reviewed and stable  Post vital signs: Reviewed and stable  Last Vitals:  Vitals Value Taken Time  BP 154/88 11/04/20 1523  Temp    Pulse 96 11/04/20 1524  Resp 17 11/04/20 1524  SpO2 99 % 11/04/20 1524  Vitals shown include unvalidated device data.  Last Pain:  Vitals:   11/04/20 1237  TempSrc:   PainSc: 3       Patients Stated Pain Goal: 2 (70/11/00 3496)  Complications: No complications documented.

## 2020-11-04 NOTE — Progress Notes (Signed)
PHARMACIST - PHYSICIAN ORDER COMMUNICATION  CONCERNING: P&T Medication Policy on Herbal Medications  DESCRIPTION:  This patient's order for:  Potassium 99 mg daily has been noted.  This product(s) is classified as an "herbal" or natural product. Due to a lack of definitive safety studies or FDA approval, nonstandard manufacturing practices, plus the potential risk of unknown drug-drug interactions while on inpatient medications, the Pharmacy and Therapeutics Committee does not permit the use of "herbal" or natural products of this type within [facility].   ACTION TAKEN: The pharmacy department is unable to verify this order at this time and your patient has been informed of this safety policy. Please reevaluate patient's clinical condition at discharge and address if the herbal or natural product(s) should be resumed at that time.   

## 2020-11-04 NOTE — Brief Op Note (Signed)
11/04/2020  3:06 PM  PATIENT:  Tressie Ellis.  55 y.o. male  PRE-OPERATIVE DIAGNOSIS:  Stenosis  POST-OPERATIVE DIAGNOSIS:  Stenosis  PROCEDURE:  Procedure(s): Laminectomy and Foraminotomy - left - Cervical four-five (Left)  SURGEON:  Surgeon(s) and Role:    Earnie Larsson, MD - Primary  PHYSICIAN ASSISTANT:   ASSISTANTSMearl Latin   ANESTHESIA:   general  EBL:  400 mL   BLOOD ADMINISTERED:none  DRAINS: none   LOCAL MEDICATIONS USED:  MARCAINE     SPECIMEN:  No Specimen  DISPOSITION OF SPECIMEN:  N/A  COUNTS:  YES  TOURNIQUET:  * No tourniquets in log *  DICTATION: .Dragon Dictation  PLAN OF CARE: Admit for overnight observation  PATIENT DISPOSITION:  PACU - hemodynamically stable.   Delay start of Pharmacological VTE agent (>24hrs) due to surgical blood loss or risk of bleeding: yes

## 2020-11-05 ENCOUNTER — Encounter (HOSPITAL_COMMUNITY): Payer: Self-pay | Admitting: Neurosurgery

## 2020-11-05 DIAGNOSIS — M4712 Other spondylosis with myelopathy, cervical region: Secondary | ICD-10-CM | POA: Diagnosis not present

## 2020-11-05 DIAGNOSIS — I10 Essential (primary) hypertension: Secondary | ICD-10-CM | POA: Diagnosis not present

## 2020-11-05 DIAGNOSIS — Z87891 Personal history of nicotine dependence: Secondary | ICD-10-CM | POA: Diagnosis not present

## 2020-11-05 DIAGNOSIS — Z79899 Other long term (current) drug therapy: Secondary | ICD-10-CM | POA: Diagnosis not present

## 2020-11-05 DIAGNOSIS — E785 Hyperlipidemia, unspecified: Secondary | ICD-10-CM | POA: Diagnosis not present

## 2020-11-05 DIAGNOSIS — Z791 Long term (current) use of non-steroidal anti-inflammatories (NSAID): Secondary | ICD-10-CM | POA: Diagnosis not present

## 2020-11-05 MED ORDER — CYCLOBENZAPRINE HCL 10 MG PO TABS: 10.0000 mg | ORAL_TABLET | Freq: Three times a day (TID) | ORAL | 0 refills | Status: DC | PRN

## 2020-11-05 MED ORDER — OMEGA-3 1000 MG PO CAPS: 1000.0000 mg | ORAL_CAPSULE | Freq: Every day | ORAL | 0 refills | Status: AC

## 2020-11-05 MED ORDER — HYDROCODONE-ACETAMINOPHEN 10-325 MG PO TABS: 1.0000 | ORAL_TABLET | ORAL | 0 refills | Status: DC | PRN

## 2020-11-05 MED FILL — Thrombin For Soln Kit 5000 Unit: CUTANEOUS | Qty: 1 | Status: AC

## 2020-11-05 NOTE — Anesthesia Postprocedure Evaluation (Signed)
Anesthesia Post Note  Patient: [name] White.  Procedure(s) Performed: Laminectomy and Foraminotomy - left - Cervical four-five (Left Neck)     Patient location during evaluation: PACU Anesthesia Type: General Level of consciousness: awake and alert, oriented and patient cooperative Pain management: pain level controlled Vital Signs Assessment: post-procedure vital signs reviewed and stable Respiratory status: spontaneous breathing, nonlabored ventilation and respiratory function stable Cardiovascular status: blood pressure returned to baseline and stable Postop Assessment: no apparent nausea or vomiting Anesthetic complications: no   No complications documented.  Last Vitals:  Vitals:   11/04/20 2329 11/05/20 0513  BP: (!) 153/91 135/87  Pulse: 80 88  Resp: 18 18  Temp: 36.5 C 36.7 C  SpO2: 95% 97%    Last Pain:  Vitals:   11/05/20 0554  TempSrc:   PainSc: [name]

## 2020-11-05 NOTE — Discharge Summary (Signed)
Physician Discharge Summary     Providing Compassionate, Quality Care - Together   Patient ID: [name] White. MRN: [id number] DOB/AGE: 03/23/1966 55 y.o.  Admit date: 11/04/2020 Discharge date: 11/05/2020  Admission Diagnoses: Cervical spondylosis with myelopathy and radiculopathy  Discharge Diagnoses:  Active Problems:   Cervical spondylosis with myelopathy and radiculopathy   Discharged Condition: good  Hospital Course: Patient underwent a left C4-5 decompressive hemilaminectomy and left C5 foraminotomy  by Dr. Annette Stable on 11/04/2020. He was admitted to 3C03  following recovery from anesthesia in the PACU. His postoperative course has been uncomplicated. He has worked with both physical and occupational therapies who feel the patient is ready for discharge home. He is ambulating independently and without difficulty. He is tolerating a normal diet. He is not having any bowel or bladder dysfunction. His pain is well-controlled with oral pain medication. He is ready for discharge home.   Consults: None  Significant Diagnostic Studies: radiology: DG Cervical Spine 2-3 Views  Result Date: 11/04/2020 CLINICAL DATA:  Localization. EXAM: CERVICAL SPINE - 2-3 VIEW COMPARISON:  Cervical spine x-rays dated October 24, 2020. FINDINGS: Two cross-table lateral intraoperative x-rays are submitted for review. The first x-ray at 13:52 demonstrates needle localizer near the tip of the C2 spinous process. The second x-ray at 1402 demonstrates surgical instrumentation posterior to the C3-C4 level. IMPRESSION: 1. Intraoperative localization as above. Electronically Signed   By: Titus Dubin M.D.   On: 11/04/2020 16:35     Treatments: surgery: Left C4-5 decompressive hemilaminectomy and left C5 foraminotomy  Discharge Exam: Blood pressure (!) 142/83, pulse 89, temperature 98.1 F (36.7 C), temperature source Oral, resp. rate 18, height 5\' 10"  (1.778 m), weight 84.1 kg, SpO2 99 %.   Alert and oriented x  4 PERRLA CN II-XII grossly intact MAE, Strength and sensation intact Incision is covered with Honeycomb dressing and Steri Strips; Dressing is clean, dry, and intact   Disposition: Discharge disposition: 01-Home or Self Care        Allergies as of 11/05/2020   No Known Allergies     Medication List    STOP taking these medications   HYDROcodone-acetaminophen 5-325 MG tablet Commonly known as: NORCO/VICODIN Replaced by: HYDROcodone-acetaminophen 10-325 MG tablet   methylPREDNISolone 4 MG Tbpk tablet Commonly known as: MEDROL DOSEPAK   traMADol 50 MG tablet Commonly known as: ULTRAM     TAKE these medications   acetaminophen 500 MG tablet Commonly known as: TYLENOL Take 1,000 mg by mouth every 6 (six) hours as needed for mild pain or moderate pain.   cyclobenzaprine 10 MG tablet Commonly known as: FLEXERIL Take 1 tablet (10 mg total) by mouth 3 (three) times daily as needed for muscle spasms.   diphenhydrAMINE 25 mg capsule Commonly known as: BENADRYL Take 25 mg by mouth every 6 (six) hours as needed for allergies.   Esomeprazole Magnesium 20 MG Tbec Take 20 mg by mouth daily before breakfast.   fexofenadine 180 MG tablet Commonly known as: ALLEGRA Take 180 mg by mouth daily.   HYDROcodone-acetaminophen 10-325 MG tablet Commonly known as: NORCO Take 1-2 tablets by mouth every 4 (four) hours as needed for severe pain ((score 7 to 10)). Replaces: HYDROcodone-acetaminophen 5-325 MG tablet   lisinopril 10 MG tablet Commonly known as: ZESTRIL Take 1 tablet (10 mg total) by mouth daily.   loperamide 2 MG capsule Commonly known as: IMODIUM Take 2 mg by mouth daily as needed for diarrhea or loose stools.   MAGNESIUM PO Take  500 mg by mouth daily.   Melatonin 10 MG Tbdp Take 10 mg by mouth daily as needed (sleep).   meloxicam 15 MG tablet Commonly known as: MOBIC Take 15 mg by mouth daily.   multivitamin with minerals Tabs tablet Take 1 tablet by mouth  daily. Centrum Silver   Omega-3 1000 MG Caps Take 1 capsule (1,000 mg total) by mouth daily. *Restart on 11/09/2020* What changed: additional instructions   Potassium 99 MG Tabs Take 99 mg by mouth daily.   vitamin C 500 MG tablet Commonly known as: ASCORBIC ACID Take 500 mg by mouth daily.       Follow-up Information    Earnie Larsson, MD. Schedule an appointment as soon as possible for a visit in 2 week(s).   Specialty: Neurosurgery Contact information: [address] [place] [address] [phone]               Signed: Viona Gilmore, DNP, AGNP-C Nurse Practitioner  [facility] [place] [address] [place], [place], [place] [address] P: [phone]    F: [phone]  11/05/2020, 10:14 AM

## 2020-11-05 NOTE — Evaluation (Signed)
Occupational Therapy Evaluation Patient Details Name: [name] White. MRN: [id number] DOB: 03/15/1966 Today's Date: 11/05/2020    History of Present Illness s/p Left C4-5 decompressive hemilaminectomy and left C5 foraminotomy   Clinical Impression   Pt admitted with the above diagnoses. At baseline pt is independent with ADLs. ADL and cervical precautions education completed. Pt is for d/c home today. No further OT needs identified.     Follow Up Recommendations  No OT follow up    Equipment Recommendations  None recommended by OT    Recommendations for Other Services       Precautions / Restrictions Precautions Precautions: Cervical Precaution Booklet Issued: Yes (comment) Required Braces or Orthoses: Other Brace (no brace per order set) Restrictions Weight Bearing Restrictions: No      Mobility Bed Mobility Overal bed mobility: Modified Independent                  Transfers Overall transfer level: Modified independent Equipment used: None             General transfer comment: no AD or assist needed    Balance Overall balance assessment: Independent                                         ADL either performed or assessed with clinical judgement   ADL Overall ADL's : Modified independent;Independent                                       General ADL Comments: Educated on cervical precautions and compensatory strategies     Vision         Perception     Praxis      Pertinent Vitals/Pain Pain Assessment: Faces Faces Pain Scale: Hurts a little bit Pain Location: incisional area Pain Descriptors / Indicators: Discomfort Pain Intervention(s): Monitored during session     Hand Dominance     Extremity/Trunk Assessment Upper Extremity Assessment Upper Extremity Assessment: Overall WFL for tasks assessed   Lower Extremity Assessment Lower Extremity Assessment: Defer to PT evaluation        Communication Communication Communication: No difficulties   Cognition Arousal/Alertness: Awake/alert Behavior During Therapy: WFL for tasks assessed/performed Overall Cognitive Status: Within Functional Limits for tasks assessed                                     General Comments       Exercises     Shoulder Instructions      Home Living Family/patient expects to be discharged to:: Private residence Living Arrangements: Alone Available Help at Discharge: Family (mother) Type of Home: House Home Access: Ramped entrance     Home Layout: One level     Bathroom Shower/Tub: Occupational psychologist: Handicapped height     Home Equipment: Bedside commode;Walker - 2 wheels;Shower seat          Prior Functioning/Environment Level of Independence: Independent                 OT Problem List: Impaired balance (sitting and/or standing);Decreased knowledge of use of DME or AE;Decreased knowledge of precautions;Pain      OT Treatment/Interventions:      OT Goals(Current  goals can be found in the care plan section) Acute Rehab OT Goals Patient Stated Goal: home asap  OT Frequency:     Barriers to D/C:            Co-evaluation              AM-PAC OT "6 Clicks" Daily Activity     Outcome Measure Help from another person eating meals?: None Help from another person taking care of personal grooming?: None Help from another person toileting, which includes using toliet, bedpan, or urinal?: None Help from another person bathing (including washing, rinsing, drying)?: None Help from another person to put on and taking off regular upper body clothing?: None Help from another person to put on and taking off regular lower body clothing?: None 6 Click Score: 24   End of Session    Activity Tolerance: Patient tolerated treatment well Patient left: in bed;with call bell/phone within reach;Other (comment) (EOB)  OT Visit Diagnosis:  Unsteadiness on feet (R26.81);Pain                Time: 0962-8366 OT Time Calculation (min): 8 min Charges:  OT General Charges $OT Visit: 1 Visit OT Evaluation $OT Eval Low Complexity: Robinson, OT Acute Rehabilitation Services Pager: [phone] Office: [phone]   Hortencia Pilar 11/05/2020, 10:39 AM

## 2020-11-05 NOTE — Plan of Care (Signed)
Patient is discharged from room 3C03 at this time. Alert and in stable condition. IV site d/c'd and instructions read to patient with understanding verbalized and all questions answered. Left unit via wheelchair with all belongings at side.

## 2020-11-05 NOTE — Discharge Instructions (Addendum)
Wound Care Keep incision covered and dry for two days.    Do not put any creams, lotions, or ointments on incision. Leave steri-strips on neck.  They will fall off by themselves.  Activity Walk each and every day, increasing distance each day. No lifting greater than 5 lbs.  Avoid excessive neck motion. No driving for 2 weeks; may ride as a passenger locally.  Diet Resume your normal diet.   Return to Work Will be discussed at your follow up appointment.  Call Your Doctor If Any of These Occur Redness, drainage, or swelling at the wound.  Temperature greater than 101 degrees. Severe pain not relieved by pain medication. Incision starts to come apart.  Follow Up Appt Call today for appointment in 1-2 weeks [phone]) or for problems.

## 2020-12-17 ENCOUNTER — Telehealth: Payer: Self-pay

## 2020-12-17 MED ORDER — LISINOPRIL 20 MG PO TABS: 20.0000 mg | ORAL_TABLET | Freq: Every day | ORAL | 3 refills | Status: DC

## 2020-12-17 NOTE — Telephone Encounter (Signed)
I do not see that it has been changed unless im overlooking a section of chart- id call and ask patient if he is on 10 or 20. If on 20 mg id ask when this was increased and by whom

## 2020-12-17 NOTE — Telephone Encounter (Signed)
I dont see where Lisinopril was increased to 20Mg . From lab note on 02/08 "Your cholesterol is above goal-I would suggest you start at least rosuvastatin 10 mg weekly #13 with 3 refills-team please send this and if he is agreeable-we can recheck liver function and LDL at next visit-would like to see LDL at least below 100"

## 2020-12-17 NOTE — Telephone Encounter (Signed)
If blood pressure 110-135/765-85 on home checks can refill at 20 mg dose for lisinopril

## 2020-12-17 NOTE — Telephone Encounter (Signed)
Called and spoke with pt and he states that he increased after seeing a Dr. Trenton Gammon and his bp was high in office at the time so the pt increased it himself to 20mg .

## 2020-12-17 NOTE — Telephone Encounter (Signed)
FYI, call pt and Pt states he has not been checking his blood pressures at home due to the wrist machine not being accurate but he feels that the 20mg  has been working best for him.

## 2020-12-17 NOTE — Telephone Encounter (Signed)
Rx filled and message given to pt.

## 2020-12-17 NOTE — Telephone Encounter (Signed)
MEDICATION: Lisinopril 20MG   PHARMACY:   [id number] - [place], [place] - [address] Phone:  [phone]  Fax:  [phone]        Comments: Patient stated he is taking 20 instead of 10mg  now.     .  **Let patient know to contact pharmacy at the end of the day to make sure medication is ready. **  ** Please notify patient to allow 48-72 hours to process**  **Encourage patient to contact the pharmacy for refills or they can request refills through [facility]**

## 2020-12-17 NOTE — Telephone Encounter (Signed)
You may refill the 20 mg but encouraged him to buy an upper arm cuff and check blood pressure to make sure it is in the range that I listed

## 2020-12-31 ENCOUNTER — Ambulatory Visit: Payer: Medicare Other | Admitting: Family Medicine

## 2021-06-06 ENCOUNTER — Ambulatory Visit: Payer: Medicare Other

## 2021-06-06 ENCOUNTER — Ambulatory Visit (INDEPENDENT_AMBULATORY_CARE_PROVIDER_SITE_OTHER): Payer: Medicare Other

## 2021-06-06 DIAGNOSIS — Z0000 Encounter for general adult medical examination without abnormal findings: Secondary | ICD-10-CM | POA: Diagnosis not present

## 2021-06-06 NOTE — Patient Instructions (Signed)
[name] White , Thank you for taking time to come for your Medicare Wellness Visit. I appreciate your ongoing commitment to your health goals. Please review the following plan we discussed and let me know if I can assist you in the future.   Screening recommendations/referrals: Colonoscopy: Done 09/08/16 repeat every 10 years  Recommended yearly ophthalmology/optometry visit for glaucoma screening and checkup Recommended yearly dental visit for hygiene and checkup  Vaccinations: Influenza vaccine: Declined  Pneumococcal vaccine: Up to date Tdap vaccine: Done 06/04/20 repeat every 10 years  Shingles vaccine: Shingrix discussed. Please contact your pharmacy for coverage information.    Covid-19: Declined and discussed   Advanced directives: Advance directive discussed with you today. Even though you declined this today please call our office should you change your mind and we can give you the proper paperwork for you to fill out.  Conditions/risks identified: None at this time   Next appointment: Follow up in one year for your annual wellness visit.   Preventive Care 57 Years and Older, Male Preventive care refers to lifestyle choices and visits with your health care provider that can promote health and wellness. What does preventive care include? A yearly physical exam. This is also called an annual well check. Dental exams once or twice a year. Routine eye exams. Ask your health care provider how often you should have your eyes checked. Personal lifestyle choices, including: Daily care of your teeth and gums. Regular physical activity. Eating a healthy diet. Avoiding tobacco and drug use. Limiting alcohol use. Practicing safe sex. Taking low doses of aspirin every day. Taking vitamin and mineral supplements as recommended by your health care provider. What happens during an annual well check? The services and screenings done by your health care provider during your annual well check  will depend on your age, overall health, lifestyle risk factors, and family history of disease. Counseling  Your health care provider may ask you questions about your: Alcohol use. Tobacco use. Drug use. Emotional well-being. Home and relationship well-being. Sexual activity. Eating habits. History of falls. Memory and ability to understand (cognition). Work and work Statistician. Screening  You may have the following tests or measurements: Height, weight, and BMI. Blood pressure. Lipid and cholesterol levels. These may be checked every 5 years, or more frequently if you are over 21 years old. Skin check. Lung cancer screening. You may have this screening every year starting at age 55 if you have a 30-pack-year history of smoking and currently smoke or have quit within the past 15 years. Fecal occult blood test (FOBT) of the stool. You may have this test every year starting at age 66. Flexible sigmoidoscopy or colonoscopy. You may have a sigmoidoscopy every 5 years or a colonoscopy every 10 years starting at age 66. Prostate cancer screening. Recommendations will vary depending on your family history and other risks. Hepatitis C blood test. Hepatitis B blood test. Sexually transmitted disease (STD) testing. Diabetes screening. This is done by checking your blood sugar (glucose) after you have not eaten for a while (fasting). You may have this done every 1-3 years. Abdominal aortic aneurysm (AAA) screening. You may need this if you are a current or former smoker. Osteoporosis. You may be screened starting at age 100 if you are at high risk. Talk with your health care provider about your test results, treatment options, and if necessary, the need for more tests. Vaccines  Your health care provider may recommend certain vaccines, such as: Influenza vaccine. This is  recommended every year. Tetanus, diphtheria, and acellular pertussis (Tdap, Td) vaccine. You may need a Td booster every 10  years. Zoster vaccine. You may need this after age 20. Pneumococcal 13-valent conjugate (PCV13) vaccine. One dose is recommended after age 19. Pneumococcal polysaccharide (PPSV23) vaccine. One dose is recommended after age 39. Talk to your health care provider about which screenings and vaccines you need and how often you need them. This information is not intended to replace advice given to you by your health care provider. Make sure you discuss any questions you have with your health care provider. Document Released: 08/30/2015 Document Revised: 04/22/2016 Document Reviewed: 06/04/2015 Elsevier Interactive Patient Education  2017 [employer] Prevention in the Home Falls can cause injuries. They can happen to people of all ages. There are many things you can do to make your home safe and to help prevent falls. What can I do on the outside of my home? Regularly fix the edges of walkways and driveways and fix any cracks. Remove anything that might make you trip as you walk through a door, such as a raised step or threshold. Trim any bushes or trees on the path to your home. Use bright outdoor lighting. Clear any walking paths of anything that might make someone trip, such as rocks or tools. Regularly check to see if handrails are loose or broken. Make sure that both sides of any steps have handrails. Any raised decks and porches should have guardrails on the edges. Have any leaves, snow, or ice cleared regularly. Use sand or salt on walking paths during winter. Clean up any spills in your garage right away. This includes oil or grease spills. What can I do in the bathroom? Use night lights. Install grab bars by the toilet and in the tub and shower. Do not use towel bars as grab bars. Use non-skid mats or decals in the tub or shower. If you need to sit down in the shower, use a plastic, non-slip stool. Keep the floor dry. Clean up any water that spills on the floor as soon as it  happens. Remove soap buildup in the tub or shower regularly. Attach bath mats securely with double-sided non-slip rug tape. Do not have throw rugs and other things on the floor that can make you trip. What can I do in the bedroom? Use night lights. Make sure that you have a light by your bed that is easy to reach. Do not use any sheets or blankets that are too big for your bed. They should not hang down onto the floor. Have a firm chair that has side arms. You can use this for support while you get dressed. Do not have throw rugs and other things on the floor that can make you trip. What can I do in the kitchen? Clean up any spills right away. Avoid walking on wet floors. Keep items that you use a lot in easy-to-reach places. If you need to reach something above you, use a strong step stool that has a grab bar. Keep electrical cords out of the way. Do not use floor polish or wax that makes floors slippery. If you must use wax, use non-skid floor wax. Do not have throw rugs and other things on the floor that can make you trip. What can I do with my stairs? Do not leave any items on the stairs. Make sure that there are handrails on both sides of the stairs and use them. Fix handrails that are  broken or loose. Make sure that handrails are as long as the stairways. Check any carpeting to make sure that it is firmly attached to the stairs. Fix any carpet that is loose or worn. Avoid having throw rugs at the top or bottom of the stairs. If you do have throw rugs, attach them to the floor with carpet tape. Make sure that you have a light switch at the top of the stairs and the bottom of the stairs. If you do not have them, ask someone to add them for you. What else can I do to help prevent falls? Wear shoes that: Do not have high heels. Have rubber bottoms. Are comfortable and fit you well. Are closed at the toe. Do not wear sandals. If you use a stepladder: Make sure that it is fully opened.  Do not climb a closed stepladder. Make sure that both sides of the stepladder are locked into place. Ask someone to hold it for you, if possible. Clearly mark and make sure that you can see: Any grab bars or handrails. First and last steps. Where the edge of each step is. Use tools that help you move around (mobility aids) if they are needed. These include: Canes. Walkers. Scooters. Crutches. Turn on the lights when you go into a dark area. Replace any light bulbs as soon as they burn out. Set up your furniture so you have a clear path. Avoid moving your furniture around. If any of your floors are uneven, fix them. If there are any pets around you, be aware of where they are. Review your medicines with your doctor. Some medicines can make you feel dizzy. This can increase your chance of falling. Ask your doctor what other things that you can do to help prevent falls. This information is not intended to replace advice given to you by your health care provider. Make sure you discuss any questions you have with your health care provider. Document Released: 05/30/2009 Document Revised: 01/09/2016 Document Reviewed: 09/07/2014 Elsevier Interactive Patient Education  2017 [employer].

## 2021-06-06 NOTE — Progress Notes (Addendum)
Virtual Visit via Telephone Note  I connected with  [name] White. on 06/06/21 at 11:45 AM EDT by telephone and verified that I am speaking with the correct person using two identifiers.  Medicare Annual Wellness visit completed telephonically due to Covid-19 pandemic.   Persons participating in this call: This Health Coach and this patient.   Location: Patient: Home Provider: Office   I discussed the limitations, risks, security and privacy concerns of performing an evaluation and management service by telephone and the availability of in person appointments. The patient expressed understanding and agreed to proceed.  Unable to perform video visit due to video visit attempted and failed and/or patient does not have video capability.   Some vital signs may be absent or patient reported.   Willette Brace, LPN   Subjective:   [name] White. is a 55 y.o. male who presents for Medicare Annual/Subsequent preventive examination.  Review of Systems     Cardiac Risk Factors include: diabetes mellitus;hypertension;dyslipidemia;male gender;advanced age (>9men, >14 women)     Objective:    Today's Vitals   There is no height or weight on file to calculate BMI.  Advanced Directives 06/06/2021 11/01/2020 05/31/2020 05/01/2019 09/20/2018 09/12/2018 09/07/2018  Does Patient Have a Medical Advance Directive? No No No No No No No  Would patient like information on creating a medical advance directive? No - Patient declined No - Patient declined No - Patient declined No - Patient declined No - Patient declined No - Patient declined -    Current Medications (verified) Outpatient Encounter Medications as of 06/06/2021  Medication Sig   acetaminophen (TYLENOL) 500 MG tablet Take 1,000 mg by mouth every 6 (six) hours as needed for mild pain or moderate pain.   diphenhydrAMINE (BENADRYL) 25 mg capsule Take 25 mg by mouth every 6 (six) hours as needed for allergies.   Esomeprazole Magnesium 20 MG  TBEC Take 20 mg by mouth daily before breakfast.   lisinopril (ZESTRIL) 20 MG tablet Take 1 tablet (20 mg total) by mouth daily.   loperamide (IMODIUM) 2 MG capsule Take 2 mg by mouth daily as needed for diarrhea or loose stools.   Melatonin 10 MG TBDP Take 10 mg by mouth daily as needed (sleep).   meloxicam (MOBIC) 15 MG tablet Take 15 mg by mouth daily. As needed   Omega-3 1000 MG CAPS Take 1 capsule (1,000 mg total) by mouth daily. *Restart on 11/09/2020*   vitamin C (ASCORBIC ACID) 500 MG tablet Take 500 mg by mouth daily.   Multiple Vitamin (MULTIVITAMIN WITH MINERALS) TABS tablet Take 1 tablet by mouth daily. Centrum Silver (Patient not taking: Reported on 06/06/2021)   [DISCONTINUED] cyclobenzaprine (FLEXERIL) 10 MG tablet Take 1 tablet (10 mg total) by mouth 3 (three) times daily as needed for muscle spasms. (Patient not taking: Reported on 06/06/2021)   [DISCONTINUED] fexofenadine (ALLEGRA) 180 MG tablet Take 180 mg by mouth daily. (Patient not taking: Reported on 06/06/2021)   [DISCONTINUED] HYDROcodone-acetaminophen (NORCO) 10-325 MG tablet Take 1-2 tablets by mouth every 4 (four) hours as needed for severe pain ((score 7 to 10)). (Patient not taking: Reported on 06/06/2021)   [DISCONTINUED] MAGNESIUM PO Take 500 mg by mouth daily. (Patient not taking: Reported on 06/06/2021)   [DISCONTINUED] Potassium 99 MG TABS Take 99 mg by mouth daily. (Patient not taking: Reported on 06/06/2021)   No facility-administered encounter medications on file as of 06/06/2021.    Allergies (verified) Patient has no known allergies.  History: Past Medical History:  Diagnosis Date   AF (atrial fibrillation) ([place]) 2009   nonsustained   Arthritis    Blood transfusion without reported diagnosis    1960's/ as baby   Cardiomyopathy secondary 2009   had ablation   Chronic pain syndrome    back pain   Dysrhythmia    states no longer a problem   Heart murmur    states no longer there   History of  kidney stones    Hyperlipidemia    Hypertension    Inguinal hernia    left   Nephrolithiasis    2 kidney stones   Pre-diabetes    PSVT (paroxysmal supraventricular tachycardia) ([place])    had ablation   Scoliosis    severe/ has a lot metal in back per pt/    Past Surgical History:  Procedure Laterality Date   ABDOMINAL EXPOSURE N/A 09/20/2018   Procedure: ABDOMINAL EXPOSURE;  Surgeon: Angelia Mould, MD;  Location: [facility] OR;  Service: Vascular;  Laterality: N/A;   ANTERIOR LUMBAR FUSION N/A 09/20/2018   Procedure: Lumbar 5 Sacral 1 Anterior lumbar interbody fusion with Lumbar 3 to Iliac fusion with pedicle screws/Lumbar 2-3 Decompressive laminectomy;  Surgeon: Earnie Larsson, MD;  Location: [place];  Service: Neurosurgery;  Laterality: N/A;  Lumbar 5 Sacral 1 Anterior lumbar interbody fusion with Lumbar 3 to Iliac fusion with pedicle screws/Lumbar 2-3 Decompressive laminectomy   APPLICATION OF ROBOTIC ASSISTANCE FOR SPINAL PROCEDURE N/A 09/20/2018   Procedure: APPLICATION OF ROBOTIC ASSISTANCE FOR SPINAL PROCEDURE;  Surgeon: Earnie Larsson, MD;  Location: [place];  Service: Neurosurgery;  Laterality: N/A;   AV NODE ABLATION     09/16/07. SVT Dr. Sandrea Hughs SURGERY     01/12/00 and 1982/ has had 6 surgeries on back   COLONOSCOPY     INGUINAL HERNIA REPAIR     Left   LUMBAR WOUND DEBRIDEMENT N/A 09/26/2018   Procedure: LUMBAR WOUND DEBRIDEMENT Placement of lumbar drain;  Surgeon: Earnie Larsson, MD;  Location: [place];  Service: Neurosurgery;  Laterality: N/A;  LUMBAR WOUND DEBRIDEMENT Placement of lumbar drain   POSTERIOR CERVICAL LAMINECTOMY Left 11/04/2020   Procedure: Laminectomy and Foraminotomy - left - Cervical four-five;  Surgeon: Earnie Larsson, MD;  Location: [place];  Service: Neurosurgery;  Laterality: Left;   POSTERIOR LUMBAR FUSION 4 LEVEL N/A 09/20/2018   Procedure: POSTERIOR LUMBAR FUSION 4 LEVEL;  Surgeon: Earnie Larsson, MD;  Location: [place];  Service: Neurosurgery;  Laterality: N/A;    REPLACEMENT TOTAL KNEE     1999, L. Murphy/wainer.    TOTAL HIP ARTHROPLASTY Right 12/07/2016   Procedure: TOTAL HIP ARTHROPLASTY ANTERIOR APPROACH;  Surgeon: Frederik Pear, MD;  Location: [place];  Service: Orthopedics;  Laterality: Right;   Family History  Problem Relation Age of Onset   Breast cancer Mother    Diabetes Father    Heart attack Father        late 4s   Atrial fibrillation Father    Liver disease Paternal Uncle    Social History   Socioeconomic History   Marital status: Divorced    Spouse name: Not on file   Number of children: Not on file   Years of education: Not on file   Highest education level: Not on file  Occupational History   Occupation: disabled   Tobacco Use   Smoking status: Former    Packs/day: 0.30    Years: 8.00    Pack years: 2.40  Types: Cigarettes    Quit date: 08/18/2011    Years since quitting: 9.8   Smokeless tobacco: Never  Vaping Use   Vaping Use: Every day   Start date: 04/17/2020   Devices: Elfbar  Substance and Sexual Activity   Alcohol use: Yes    Alcohol/week: 12.0 standard drinks    Types: 12 Cans of beer per week    Comment: weekly   Drug use: No    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 2 daughters. No grandkids.       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 Out 2001.       Primary caregiver for father who lives with him    Social Determinants of Health   Financial Resource Strain: Low Risk    Difficulty of Paying Living Expenses: Not hard at all  Food Insecurity: No Food Insecurity   Worried About Charity fundraiser in the Last Year: Never true   Arboriculturist in the Last Year: Never true  Transportation Needs: No Transportation Needs   Lack of Transportation (Medical): No   Lack of Transportation (Non-Medical): No  Physical Activity: Inactive   Days of Exercise per Week: 0 days   Minutes of  Exercise per Session: 0 min  Stress: No Stress Concern Present   Feeling of Stress : Not at all  Social Connections: Socially Isolated   Frequency of Communication with Friends and Family: More than three times a week   Frequency of Social Gatherings with Friends and Family: More than three times a week   Attends Religious Services: Never   Marine scientist or Organizations: No   Attends Music therapist: Never   Marital Status: Divorced    Tobacco Counseling Counseling given: Not Answered   Clinical Intake:  Pre-visit preparation completed: Yes  Pain : 0-10 Pain Score:  (ranges) Pain Type: Chronic pain Pain Location: Other (Comment) (radiating) Pain Descriptors / Indicators: Radiating Pain Onset: More than a month ago Pain Frequency: Intermittent     BMI - recorded: 26.61 Nutritional Status: BMI 25 -29 Overweight Nutritional Risks: None Diabetes: Yes CBG done?: No Did pt. bring in CBG monitor from home?: No  How often do you need to have someone help you when you read instructions, pamphlets, or other written materials from your doctor or pharmacy?: 1 - Never  Diabetic?Nutrition Risk Assessment:  Has the patient had any N/V/D within the last 2 months?  No  Does the patient have any non-healing wounds?  No  Has the patient had any unintentional weight loss or weight gain?  No   Diabetes:  Is the patient diabetic?  Yes  If diabetic, was a CBG obtained today?  No  Did the patient bring in their glucometer from home?  No  How often do you monitor your CBG's? N/A.   Financial Strains and Diabetes Management:  Are you having any financial strains with the device, your supplies or your medication? No .  Does the patient want to be seen by Chronic Care Management for management of their diabetes?  No  Would the patient like to be referred to a Nutritionist or for Diabetic Management?  No   Diabetic Exams:  Diabetic Eye Exam: Overdue for diabetic  eye exam. Pt has been advised about the importance in completing this exam. Patient advised  to call and schedule an eye exam. Diabetic Foot Exam: Completed 06/24/20   Interpreter Needed?: No  Information entered by :: Charlott Rakes, LPN   Activities of Daily Living In your present state of health, do you have any difficulty performing the following activities: 06/06/2021 11/01/2020  Hearing? N N  Vision? N N  Difficulty concentrating or making decisions? N N  Walking or climbing stairs? N N  Dressing or bathing? N N  Doing errands, shopping? N N  Preparing Food and eating ? N -  Using the Toilet? N -  In the past six months, have you accidently leaked urine? N -  Do you have problems with loss of bowel control? N -  Managing your Medications? N -  Managing your Finances? N -  Housekeeping or managing your Housekeeping? N -  Some recent data might be hidden    Patient Care Team: Marin Olp, MD as PCP - General (Family Medicine) Earnie Larsson, MD as Consulting Physician (Neurosurgery) Angelia Mould, MD as Consulting Physician (Vascular Surgery)  Indicate any recent Medical Services you may have received from other than [facility] providers in the past year (date may be approximate).     Assessment:   This is a routine wellness examination for [place].  Hearing/Vision screen Hearing Screening - Comments:: Pt denies any hearing issues  Vision Screening - Comments:: Pt encouraged to follow up with Eye Dr for annual care  Dietary issues and exercise activities discussed: Current Exercise Habits: The patient does not participate in regular exercise at present   Goals Addressed             This Visit's Progress    Patient Stated       None at this time        Depression Screen PHQ 2/9 Scores 06/06/2021 09/24/2020 05/31/2020 11/07/2019 05/01/2019 10/13/2017  PHQ - 2 Score 0 0 0 0 0 0  PHQ- 9 Score - - - 1 - -    Fall Risk Fall Risk  06/06/2021 09/24/2020 05/31/2020  09/28/2019 05/01/2019  Falls in the past year? 0 1 0 0 0  Number falls in past yr: 0 0 0 0 0  Injury with Fall? 0 1 0 0 0  Follow up Falls prevention discussed - Falls prevention discussed - Falls evaluation completed    FALL RISK PREVENTION PERTAINING TO THE HOME:  Any stairs in or around the home? Yes  If so, are there any without handrails? No  Home free of loose throw rugs in walkways, pet beds, electrical cords, etc? Yes  Adequate lighting in your home to reduce risk of falls? Yes   ASSISTIVE DEVICES UTILIZED TO PREVENT FALLS:  Life alert? No  Use of a cane, walker or w/c? No  Grab bars in the bathroom? No  Shower chair or bench in shower? No  Elevated toilet seat or a handicapped toilet? No   TIMED UP AND GO:  Was the test performed? No .   Cognitive Function:     6CIT Screen 06/06/2021 05/31/2020  What Year? 0 points 0 points  What month? 0 points 0 points  What time? 0 points -  Count back from 20 0 points 0 points  Months in reverse 2 points 0 points  Repeat phrase 0 points 0 points  Total Score 2 -    Immunizations Immunization History  Administered Date(s) Administered   Influenza Split 06/19/2011   Influenza Whole 05/20/2009, 06/16/2010   Influenza,inj,Quad PF,6+ Mos 08/01/2013,  07/03/2014, 06/21/2015, 06/16/2018   Influenza-Unspecified 06/22/2016   Pneumococcal Conjugate-13 07/03/2014   Pneumococcal Polysaccharide-23 06/24/2020   Td 01/06/2010   Tdap 06/04/2020    TDAP status: Up to date  Flu Vaccine status: Due, Education has been provided regarding the importance of this vaccine. Advised may receive this vaccine at local pharmacy or Health Dept. Aware to provide a copy of the vaccination record if obtained from local pharmacy or Health Dept. Verbalized acceptance and understanding.  Pneumococcal vaccine status: Due, Education has been provided regarding the importance of this vaccine. Advised may receive this vaccine at local pharmacy or Health  Dept. Aware to provide a copy of the vaccination record if obtained from local pharmacy or Health Dept. Verbalized acceptance and understanding.  Covid-19 vaccine status: Declined, Education has been provided regarding the importance of this vaccine but patient still declined. Advised may receive this vaccine at local pharmacy or Health Dept.or vaccine clinic. Aware to provide a copy of the vaccination record if obtained from local pharmacy or Health Dept. Verbalized acceptance and understanding.  Qualifies for Shingles Vaccine? Yes   Zostavax completed No   Shingrix Completed?: No.    Education has been provided regarding the importance of this vaccine. Patient has been advised to call insurance company to determine out of pocket expense if they have not yet received this vaccine. Advised may also receive vaccine at local pharmacy or Health Dept. Verbalized acceptance and understanding.  Screening Tests Health Maintenance  Topic Date Due   OPHTHALMOLOGY EXAM  Never done   Zoster Vaccines- Shingrix (1 of 2) Never done   HEMOGLOBIN A1C  03/24/2021   COVID-19 Vaccine (1) 06/22/2021 (Originally 02/24/1966)   INFLUENZA VACCINE  11/14/2021 (Originally 03/17/2021)   FOOT EXAM  06/24/2021   COLONOSCOPY (Pts 45-7yrs Insurance coverage will need to be confirmed)  09/08/2021   Pneumococcal Vaccine 22-46 Years old (3 - PPSV23 if available, else PCV20) 08/26/2028   TETANUS/TDAP  06/04/2030   Hepatitis C Screening  Completed   HIV Screening  Completed   HPV VACCINES  Aged Out    Health Maintenance  Health Maintenance Due  Topic Date Due   OPHTHALMOLOGY EXAM  Never done   Zoster Vaccines- Shingrix (1 of 2) Never done   HEMOGLOBIN A1C  03/24/2021    Colorectal cancer screening: Type of screening: Colonoscopy. Completed 09/08/16. Repeat every 5 years   Additional Screening:  Hepatitis C Screening:  Completed 07/13/16  Vision Screening: Recommended annual ophthalmology exams for early detection  of glaucoma and other disorders of the eye. Is the patient up to date with their annual eye exam?  No  Who is the provider or what is the name of the office in which the patient attends annual eye exams? Encouraged to follow up  If pt is not established with a provider, would they like to be referred to a provider to establish care? No .   Dental Screening: Recommended annual dental exams for proper oral hygiene  Community Resource Referral / Chronic Care Management: CRR required this visit?  No   CCM required this visit?  No      Plan:     I have personally reviewed and noted the following in the patient's chart:   Medical and social history Use of alcohol, tobacco or illicit drugs  Current medications and supplements including opioid prescriptions. Patient is not currently taking opioid prescriptions. Functional ability and status Nutritional status Physical activity Advanced directives List of other physicians Hospitalizations, surgeries, and ER  visits in previous 12 months Vitals Screenings to include cognitive, depression, and falls Referrals and appointments  In addition, I have reviewed and discussed with patient certain preventive protocols, quality metrics, and best practice recommendations. A written personalized care plan for preventive services as well as general preventive health recommendations were provided to patient.     Willette Brace, LPN   [id number]   Nurse Notes: None

## 2021-09-16 ENCOUNTER — Other Ambulatory Visit: Payer: Self-pay | Admitting: Family Medicine

## 2021-10-13 ENCOUNTER — Encounter: Payer: Self-pay | Admitting: Gastroenterology

## 2022-01-22 ENCOUNTER — Encounter: Payer: Self-pay | Admitting: Family Medicine

## 2022-05-11 ENCOUNTER — Encounter: Payer: Self-pay | Admitting: *Deleted

## 2022-07-30 ENCOUNTER — Encounter: Payer: Self-pay | Admitting: *Deleted

## 2022-08-11 NOTE — Telephone Encounter (Signed)
Could you follow up with him? A few months ago tried to get him plugged back in for diabetes care? He may have transferred clinics but want to check and make sure

## 2022-08-28 ENCOUNTER — Ambulatory Visit (INDEPENDENT_AMBULATORY_CARE_PROVIDER_SITE_OTHER): Payer: Medicare Other | Admitting: Family Medicine

## 2022-08-28 ENCOUNTER — Encounter: Payer: Self-pay | Admitting: Family Medicine

## 2022-08-28 VITALS — BP 176/101 | HR 98 | Temp 97.8°F | Ht 70.0 in | Wt 188.0 lb

## 2022-08-28 DIAGNOSIS — E785 Hyperlipidemia, unspecified: Secondary | ICD-10-CM

## 2022-08-28 DIAGNOSIS — I10 Essential (primary) hypertension: Secondary | ICD-10-CM | POA: Diagnosis not present

## 2022-08-28 DIAGNOSIS — E1169 Type 2 diabetes mellitus with other specified complication: Secondary | ICD-10-CM | POA: Diagnosis not present

## 2022-08-28 DIAGNOSIS — E119 Type 2 diabetes mellitus without complications: Secondary | ICD-10-CM

## 2022-08-28 LAB — POCT GLYCOSYLATED HEMOGLOBIN (HGB A1C): Hemoglobin A1C: 6 % — AB (ref 4.0–5.6)

## 2022-08-28 NOTE — Patient Instructions (Addendum)
Consider shingrix in the future- wanted to hold for now  blood pressure very high in office - he agrees to monitor at home over the next week and update me perhaps as soon as he can in relation to travel. Blood pressure was high in office previously but better at home so could have white coat element- if also high at home- we may need to adjust medicine -goal at home <135/85  hopefully improved cholesterol- if not consider once a week rosuvastatin 5 mg   Update eye exam- make sure they know you have diabetes- then have them send [place] a copy  [facility] contact Please call to schedule visit and/or procedure Address: [address], [place], [place] [address] Phone: [phone]  Recommended follow up: Return in about 6 months (around 02/26/2023) for physical or sooner if needed.Schedule b4 you leave.

## 2022-08-28 NOTE — Progress Notes (Signed)
Phone [phone] In person visit   Subjective:   [name] White. is a 57 y.o. year old very pleasant male patient who presents for/with See problem oriented charting Chief Complaint  Patient presents with   Follow-up    Pt has no questions or concerns    Diabetes    Past Medical History-  Patient Active Problem List   Diagnosis Date Noted   Status post spinal surgery 09/20/2018    Priority: High   Diabetes mellitus type II, controlled ([place]) 05/27/2012    Priority: High   History of supraventricular tachycardia 06/16/2010    Priority: High   History of colon polyps 09/16/2016    Priority: Medium    Genital warts 07/06/2016    Priority: Medium    SCOLIOSIS, THORACOLUMBAR 01/06/2010    Priority: Medium    Hyperlipidemia associated with type 2 diabetes mellitus ([place]) 04/14/2007    Priority: Medium    Essential hypertension 03/07/2007    Priority: Medium    Degenerative spondylolisthesis 09/20/2018    Priority: Low   Arthritis of right hip 12/07/2016    Priority: Low   Primary osteoarthritis of right hip 12/02/2016    Priority: Low   Osteoarthritis of right knee 12/04/2014    Priority: Low   Former smoker 12/04/2014    Priority: Low   Cervical spondylosis with myelopathy and radiculopathy 11/04/2020    Medications- reviewed and updated Current Outpatient Medications  Medication Sig Dispense Refill   acetaminophen (TYLENOL) 500 MG tablet Take 1,000 mg by mouth every 6 (six) hours as needed for mild pain or moderate pain.     diphenhydrAMINE (BENADRYL) 25 mg capsule Take 25 mg by mouth every 6 (six) hours as needed for allergies.     Esomeprazole Magnesium 20 MG TBEC Take 20 mg by mouth daily before breakfast.     lisinopril (ZESTRIL) 20 MG tablet TAKE 1 TABLET(20 MG) BY MOUTH DAILY 90 tablet 3   loperamide (IMODIUM) 2 MG capsule Take 2 mg by mouth daily as needed for diarrhea or loose stools.     Melatonin 10 MG TBDP Take 10 mg by mouth daily as needed (sleep).      Omega-3 1000 MG CAPS Take 1 capsule (1,000 mg total) by mouth daily. *Restart on 11/09/2020*  0   vitamin C (ASCORBIC ACID) 500 MG tablet Take 500 mg by mouth daily.     No current facility-administered medications for this visit.     Objective:  BP (!) 176/101   Pulse 98   Temp 97.8 F (36.6 C) (Temporal)   Ht '5\' 10"'$  (1.778 m)   Wt 188 lb (85.3 kg)   SpO2 97%   BMI 26.98 kg/m  Gen: NAD, resting comfortably CV: RRR no murmurs rubs or gallops Lungs: CTAB no crackles, wheeze, rhonchi Ext: no edema Skin: warm, dry    Assessment and Plan   #Social update- traveling to [place] soon . Not high transmission areas for malaria and wants to hold off on flu shot, hep a, hep b  # Chronic back pain/ scoliosis-on disability related to this-essentially tolerate severe pain after prior spinal surgery  # Diabetes-new diagnosis June 05, 2020 S: Medication:Has been diet controlled CBGs- does not check Exercise and diet- has mainly cut down aggressively on sugars and very helpful- has cut out dr. Samson Frederic- was at 2-3 of 16 oz before. Does coffee black.  -exercise limited by back issues Lab Results  Component Value Date   HGBA1C 6.0 (A) 08/28/2022  HGBA1C 6.3 09/24/2020   HGBA1C 7.3 (H) 06/04/2020  A/P: diabetes well controlled- continue diet control- has done great job with weight loss down nearly 30 lbs!   #hypertension S: medication: Lisinopril 20 mg Home readings #s: has wrist cuff but not checking BP Readings from Last 3 Encounters:  08/28/22 (!) 176/101  11/05/20 (!) 142/83  11/01/20 (!) 186/96  A/P: blood pressure very high in office - he agrees to monitor at home over the next week and update me perhaps as soon as he can in relation to travel. Blood pressure was high in office previously but better at home so could have white coat element- if also high at home- we may need to adjust medicine   #hyperlipidemia S: Medication: Omega-3 Lab Results  Component Value Date   CHOL  217 (H) 09/24/2020   HDL 65.60 09/24/2020   LDLCALC 114 (H) 09/24/2020   LDLDIRECT 139.0 09/28/2019   TRIG 185.0 (H) 09/24/2020   CHOLHDL 3 09/24/2020  A/P: hopefully improved cholesterol- if not consider once a week rosuvastatin 5 mg - very low dose given pain history  # GERD S:Medication: Nexium 20 mg otc A/P: reasonable control- continue current medications    #HM Needs to schedule colonoscopy- gave #    Recommended follow up: Return in about 6 months (around 02/26/2023) for physical or sooner if needed.Schedule b4 you leave.  Lab/Order associations: ate 6 am- labs around 3 30 pm    ICD-10-CM   1. Controlled type 2 diabetes mellitus without complication, without long-term current use of insulin (HCC)  E11.9 POCT HgB A1C    2. Essential hypertension  I10     3. Hyperlipidemia associated with type 2 diabetes mellitus ([place])  E11.69    E78.5       No orders of the defined types were placed in this encounter.   Return precautions advised.  Garret Reddish, MD

## 2022-08-29 LAB — MICROALBUMIN / CREATININE URINE RATIO
Creatinine, Urine: 88 mg/dL (ref 20–320)
Microalb Creat Ratio: 24 mcg/mg creat (ref <30)
Microalb, Ur: 2.1 mg/dL

## 2022-08-29 LAB — COMPREHENSIVE METABOLIC PANEL
AG Ratio: 1.6 (calc) (ref 1.0–2.5)
ALT: 41 U/L (ref 9–46)
AST: 32 U/L (ref 10–35)
Albumin: 4.7 g/dL (ref 3.6–5.1)
Alkaline phosphatase (APISO): 59 U/L (ref 35–144)
BUN: 14 mg/dL (ref 7–25)
CO2: 26 mmol/L (ref 20–32)
Calcium: 9.9 mg/dL (ref 8.6–10.3)
Chloride: 100 mmol/L (ref 98–110)
Creat: 0.87 mg/dL (ref 0.70–1.30)
Globulin: 2.9 g/dL (calc) (ref 1.9–3.7)
Glucose, Bld: 101 mg/dL — ABNORMAL HIGH (ref 65–99)
Potassium: 4.5 mmol/L (ref 3.5–5.3)
Sodium: 141 mmol/L (ref 135–146)
Total Bilirubin: 0.9 mg/dL (ref 0.2–1.2)
Total Protein: 7.6 g/dL (ref 6.1–8.1)

## 2022-08-29 LAB — CBC WITH DIFFERENTIAL/PLATELET
Absolute Monocytes: 504 cells/uL (ref 200–950)
Basophils Absolute: 50 cells/uL (ref 0–200)
Basophils Relative: 0.8 %
Eosinophils Absolute: 69 cells/uL (ref 15–500)
Eosinophils Relative: 1.1 %
HCT: 52 % — ABNORMAL HIGH (ref 38.5–50.0)
Hemoglobin: 17.7 g/dL — ABNORMAL HIGH (ref 13.2–17.1)
Lymphs Abs: 1714 cells/uL (ref 850–3900)
MCH: 31.2 pg (ref 27.0–33.0)
MCHC: 34 g/dL (ref 32.0–36.0)
MCV: 91.5 fL (ref 80.0–100.0)
MPV: 10.2 fL (ref 7.5–12.5)
Monocytes Relative: 8 %
Neutro Abs: 3963 cells/uL (ref 1500–7800)
Neutrophils Relative %: 62.9 %
Platelets: 261 10*3/uL (ref 140–400)
RBC: 5.68 10*6/uL (ref 4.20–5.80)
RDW: 13.5 % (ref 11.0–15.0)
Total Lymphocyte: 27.2 %
WBC: 6.3 10*3/uL (ref 3.8–10.8)

## 2022-08-29 LAB — LIPID PANEL
Cholesterol: 226 mg/dL — ABNORMAL HIGH (ref <200)
HDL: 54 mg/dL (ref >=40)
LDL Cholesterol (Calc): 139 mg/dL (calc) — ABNORMAL HIGH
Non-HDL Cholesterol (Calc): 172 mg/dL (calc) — ABNORMAL HIGH (ref <130)
Total CHOL/HDL Ratio: 4.2 (calc) (ref <5.0)
Triglycerides: 191 mg/dL — ABNORMAL HIGH (ref <150)

## 2022-09-01 ENCOUNTER — Other Ambulatory Visit: Payer: Self-pay

## 2022-09-01 MED ORDER — ROSUVASTATIN CALCIUM 5 MG PO TABS: 5.0000 mg | ORAL_TABLET | ORAL | 3 refills | Status: DC

## 2022-09-15 ENCOUNTER — Encounter: Payer: Self-pay | Admitting: Internal Medicine

## 2022-09-15 ENCOUNTER — Ambulatory Visit (INDEPENDENT_AMBULATORY_CARE_PROVIDER_SITE_OTHER): Payer: Medicare Other | Admitting: Internal Medicine

## 2022-09-15 VITALS — BP 160/81 | HR 75 | Temp 98.0°F | Ht 70.0 in | Wt 189.8 lb

## 2022-09-15 DIAGNOSIS — J019 Acute sinusitis, unspecified: Secondary | ICD-10-CM

## 2022-09-15 DIAGNOSIS — J340 Abscess, furuncle and carbuncle of nose: Secondary | ICD-10-CM

## 2022-09-15 DIAGNOSIS — J3489 Other specified disorders of nose and nasal sinuses: Secondary | ICD-10-CM | POA: Diagnosis not present

## 2022-09-15 DIAGNOSIS — B9689 Other specified bacterial agents as the cause of diseases classified elsewhere: Secondary | ICD-10-CM

## 2022-09-15 DIAGNOSIS — H6991 Unspecified Eustachian tube disorder, right ear: Secondary | ICD-10-CM

## 2022-09-15 LAB — CBC
HCT: 49.1 % (ref 39.0–52.0)
Hemoglobin: 16.7 g/dL (ref 13.0–17.0)
MCHC: 34 g/dL (ref 30.0–36.0)
MCV: 94.2 fl (ref 78.0–100.0)
Platelets: 246 10*3/uL (ref 150.0–400.0)
RBC: 5.22 Mil/uL (ref 4.22–5.81)
RDW: 14.2 % (ref 11.5–15.5)
WBC: 4.9 10*3/uL (ref 4.0–10.5)

## 2022-09-15 LAB — SEDIMENTATION RATE: Sed Rate: 14 mm/hr (ref 0–20)

## 2022-09-15 LAB — COMPREHENSIVE METABOLIC PANEL
ALT: 38 U/L (ref 0–53)
AST: 30 U/L (ref 0–37)
Albumin: 4.4 g/dL (ref 3.5–5.2)
Alkaline Phosphatase: 58 U/L (ref 39–117)
BUN: 13 mg/dL (ref 6–23)
CO2: 30 mEq/L (ref 19–32)
Calcium: 9.2 mg/dL (ref 8.4–10.5)
Chloride: 101 mEq/L (ref 96–112)
Creatinine, Ser: 0.8 mg/dL (ref 0.40–1.50)
GFR: 97.18 mL/min (ref >60.00)
Glucose, Bld: 113 mg/dL — ABNORMAL HIGH (ref 70–99)
Potassium: 3.8 mEq/L (ref 3.5–5.1)
Sodium: 141 mEq/L (ref 135–145)
Total Bilirubin: 0.7 mg/dL (ref 0.2–1.2)
Total Protein: 7.3 g/dL (ref 6.0–8.3)

## 2022-09-15 MED ORDER — SIMPLY SALINE 0.9 % NA AERS: 2.0000 | INHALATION_SPRAY | NASAL | 11 refills | Status: AC

## 2022-09-15 MED ORDER — LEVOFLOXACIN 500 MG PO TABS: 500.0000 mg | ORAL_TABLET | Freq: Every day | ORAL | 0 refills | Status: DC

## 2022-09-15 MED ORDER — PSEUDOEPHEDRINE HCL ER 120 MG PO TB12: 120.0000 mg | ORAL_TABLET | Freq: Two times a day (BID) | ORAL | 0 refills | Status: AC

## 2022-09-15 MED ORDER — LORATADINE 10 MG PO TABS: 10.0000 mg | ORAL_TABLET | Freq: Every day | ORAL | 11 refills | Status: AC

## 2022-09-15 MED ORDER — FLUTICASONE PROPIONATE 50 MCG/ACT NA SUSP: 2.0000 | Freq: Every day | NASAL | 6 refills | Status: AC

## 2022-09-15 NOTE — Progress Notes (Signed)
[facility]: [phone]   Routine Medical Office Visit  Patient:  [name] White.      Age: 57 y.o.       Sex:  male  Date:   09/15/2022  PCP:    Marin Olp, MD   [place] Provider: Loralee Pacas, MD   Problem Focused Charting:   Medical Decision Making per Assessment/Plan   Haakon was seen today for possible sinus infection, cough and nasal congestion.  Acute bacterial sinusitis -     Fluticasone Propionate; Place 2 sprays into both nostrils daily.  Dispense: 16 g; Refill: 6 -     Simply Saline; Place 2 each into the nose as directed. Use nightly for sinus hygiene long-term.  Can also be used as many times daily as desired to assist with clearing congested sinuses.  Dispense: 44 mL; Refill: 11 -     Loratadine; Take 1 tablet (10 mg total) by mouth daily.  Dispense: 30 tablet; Refill: 11 -     Pseudoephedrine HCl ER; Take 1 tablet (120 mg total) by mouth 2 (two) times daily.  Dispense: 20 tablet; Refill: 0 -     levoFLOXacin; Take 1 tablet (500 mg total) by mouth daily for 7 days.  Dispense: 7 tablet; Refill: 0 -     CBC -     Comprehensive metabolic panel -     Sedimentation rate -     ANCA screen with reflex titer -     ANA -     CT MAXILLOFACIAL W CONTRAST; Future -     Ambulatory referral to ENT  Nasal septal perforation Overview: Patients chart review and interview were used to generate a prompt for artificial intelligence analysis ([employer] artificial intelligence) clinical decision support.  AI output was reviewed and is provided in red:   Comprehensive Review of the Case: The individual is a 57 year old with a history of acute bacterial sinusitis occurring three times in the past seven years. On examination, a septal perforation was discovered, which the individual was not aware of. The individual denies any history of snorting drugs. They report that breathing through the nose is usually satisfactory, although it becomes  obstructed during episodes of sinusitis. There is no information provided regarding the individual's medications, allergies, social history, physical examination findings beyond the septal perforation, laboratory data, illness course, or imaging data and other studies. The absence of these details limits the ability to fully assess the case. The presence of additional symptoms, exposures, or specific findings on laboratory or imaging studies could further suggest a diagnosis.  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 GPA is an autoimmune condition that can cause chronic sinusitis and septal perforation due to granulomatous inflammation and vasculitis. The recurrent sinusitis and the finding of a septal perforation without a history of trauma or drug use are highly suggestive of this condition. The presence of systemic symptoms such as joint pains, renal impairment, or abnormal findings on urinalysis could further suggest this diagnosis.  Expanded DDx:  1. Cocaine-induced midline destructive lesions (CIMDL): Despite the individual's denial of snorting drugs, CIMDL could explain the septal perforation and recurrent sinusitis. However, this diagnosis is less likely without a history of drug use. The presence of a positive toxicology screen for cocaine or other illicit substances could further suggest this diagnosis.  2. Sarcoidosis: Sarcoidosis is a multisystem granulomatous disorder that can affect the nasal  mucosa and lead to septal perforation. It can also cause sinusitis. The presence of non-caseating granulomas on biopsy or other organ involvement such as pulmonary nodules or skin lesions could further suggest this diagnosis.  Alternative DDx:  1. Lymphomatoid Granulomatosis: This rare, Epstein-Barr virus-associated B-cell lymphoproliferative disorder can present with sinusitis and nasal septum  perforation. The presence of atypical lymphoid cells on biopsy and evidence of EBV infection could further suggest this diagnosis.  2. Churg-Strauss Syndrome (Eosinophilic Granulomatosis with Polyangiitis): This vasculitis is associated with asthma and eosinophilia and can cause sinusitis and septal perforation. The presence of asthma, peripheral eosinophilia, or positive p-ANCA could further suggest this diagnosis.  3. Tuberculosis (TB): TB can cause septal perforation and sinusitis, especially in endemic areas or in immunocompromised individuals. The presence of acid-fast bacilli on sputum or tissue biopsy or a positive interferon-gamma release assay could further suggest this diagnosis.  4. Syphilis: Tertiary syphilis can lead to septal perforation. The presence of positive serologic tests for syphilis could further suggest this diagnosis.  5. Lethal Midline Granuloma (Natural Killer/T-cell lymphoma): This aggressive malignancy can cause destruction of midline facial structures, including the nasal septum. The presence of angiocentric and angiodestructive lymphoid cells on biopsy could further suggest this diagnosis.  6. Fungal Infections: Invasive fungal infections, such as those caused by Aspergillus or Mucor species, can lead to sinusitis and septal perforation, especially in immunocompromised individuals. The presence of fungal hyphae on histopathology or a positive fungal culture could further suggest this diagnosis.  7. Relapsing Polychondritis: This rare multisystem disorder can cause inflammation of cartilaginous structures, including the nasal septum, potentially leading to perforation. The presence of inflammation in other cartilaginous structures, such as the ears or trachea, could further suggest this diagnosis.  8. Midline Destructive Disease: This is a term used to describe a group of conditions that lead to the destruction of midline facial structures. The presence of a specific  etiology such as a granulomatous infection or malignancy could further suggest this diagnosis.  9. Trauma: Repeated trauma or surgery to the nasal septum can cause perforation. The presence of a history of nasal surgery or trauma could further suggest this diagnosis, although the individual did not report such a history.  10. Primary Immunodeficiency: Certain primary immunodeficiencies can present with recurrent sinusitis and may lead to septal perforation due to chronic inflammation. The presence of a history of recurrent infections or abnormal immunoglobulin levels could further suggest this diagnosis.  Assessment & Plan: Patients chart review and interview were used to generate a prompt for artificial intelligence analysis ([employer] artificial intelligence) clinical decision support.  AI output was reviewed and is provided in red:   #Septal Perforation  A 57 year old male with a history of recurrent acute bacterial sinusitis presents with an incidental finding of septal perforation, which he was previously unaware of. The patient denies any history of drug insufflation, which is a common cause of septal perforation. The recurrent sinus infections could suggest an underlying issue with nasal structure or immune defense. The septal perforation may be contributing to the sinusitis by altering normal nasal airflow and mucociliary clearance. However, the patient reports that nasal breathing is usually satisfactory, though it becomes obstructed during episodes of sinusitis. The differential diagnosis includes septal perforation secondary to chronic inflammation from recurrent sinusitis, traumatic injury, Wegener's granulomatosis, or other autoimmune conditions.  Dx: - Nasal endoscopy to assess the extent of the septal perforation and to look for other nasal abnormalities. - CT scan of the sinuses to evaluate the sinus  anatomy and to rule out any sinus disease that could be contributing to the septal  perforation. - Blood tests including complete blood count (CBC), erythrocyte sedimentation rate (ESR), and antineutrophil cytoplasmic antibodies (ANCA) to assess for systemic inflammation or autoimmune conditions. - Referral to an otolaryngologist for further evaluation and management.  Tx: - Saline nasal irrigations to maintain moisture in the nasal passages and to help clear any mucus or debris. - Nasal emollients to prevent drying and crusting within the nasal cavity. - Avoidance of nasal irritants, such as smoke and strong chemicals. - Consideration of septal perforation repair if the patient is symptomatic or if the perforation is large, but this would be based on specialist advice. - Management of recurrent sinusitis, which may include antibiotics for acute episodes, nasal corticosteroids, and possibly sinus surgery if indicated by the otolaryngologist after a thorough evaluation.  Orders: -     RPR -     HIV Antibody (routine testing w rflx)  Abscess, furuncle and carbuncle of nose -     CBC -     Comprehensive metabolic panel -     Sedimentation rate -     ANCA screen with reflex titer -     ANA -     CT MAXILLOFACIAL W CONTRAST; Future -     Ambulatory referral to ENT  Eustachian tube dysfunction, right     Agree with artificial intelligence analysis that wegeners is needing ruled out- this is a moderately large septal perforation without other good explanation, in the setting of severe recurrent sinusitis   He did have trauma to nose in the 80s but he has never noticed perforated septum so that seems unlikely to relate.  Follow up Primary Care Provider (PCP) 1 week(s),    Subjective - Clinical Presentation:   [name] White. is a 57 y.o. male  Patient Active Problem List   Diagnosis Date Noted   Nasal septal perforation 09/15/2022   Cervical spondylosis with myelopathy and radiculopathy 11/04/2020   Status post spinal surgery 09/20/2018   Degenerative  spondylolisthesis 09/20/2018   Arthritis of right hip 12/07/2016   Primary osteoarthritis of right hip 12/02/2016   History of colon polyps 09/16/2016   Genital warts 07/06/2016   Osteoarthritis of right knee 12/04/2014   Former smoker 12/04/2014   Diabetes mellitus type II, controlled ([place]) 05/27/2012   History of supraventricular tachycardia 06/16/2010   SCOLIOSIS, THORACOLUMBAR 01/06/2010   Hyperlipidemia associated with type 2 diabetes mellitus ([place]) 04/14/2007   Essential hypertension 03/07/2007   Past Medical History:  Diagnosis Date   AF (atrial fibrillation) ([place]) 2009   nonsustained   Arthritis    Blood transfusion without reported diagnosis    1960's/ as baby   Cardiomyopathy secondary 2009   had ablation   Chronic pain syndrome    back pain   Dysrhythmia    states no longer a problem   Heart murmur    states no longer there   History of kidney stones    Hyperlipidemia    Hypertension    Inguinal hernia    left   Nephrolithiasis    2 kidney stones   Pre-diabetes    PSVT (paroxysmal supraventricular tachycardia)    had ablation   Scoliosis    severe/ has a lot metal in back per pt/     Outpatient Medications Prior to Visit  Medication Sig   acetaminophen (TYLENOL) 500 MG tablet Take 1,000 mg by mouth every 6 (six) hours as  needed for mild pain or moderate pain.   diphenhydrAMINE (BENADRYL) 25 mg capsule Take 25 mg by mouth every 6 (six) hours as needed for allergies.   Esomeprazole Magnesium 20 MG TBEC Take 20 mg by mouth daily before breakfast.   lisinopril (ZESTRIL) 20 MG tablet TAKE 1 TABLET(20 MG) BY MOUTH DAILY   loperamide (IMODIUM) 2 MG capsule Take 2 mg by mouth daily as needed for diarrhea or loose stools.   Melatonin 10 MG TBDP Take 10 mg by mouth daily as needed (sleep).   Omega-3 1000 MG CAPS Take 1 capsule (1,000 mg total) by mouth daily. *Restart on 11/09/2020*   rosuvastatin (CRESTOR) 5 MG tablet Take 1 tablet (5 mg total) by mouth once a week.    vitamin C (ASCORBIC ACID) 500 MG tablet Take 500 mg by mouth daily.   No facility-administered medications prior to visit.    Chief Complaint  Patient presents with   Possible sinus infection    Symptoms for about a week   Cough    Productive-green mucus (from post nasal drip).   Nasal Congestion    Requests Levaquin(levofloxacin) if an antibiotic is needed.    HPI  History of excellent response to severe sinus infection with levaquin Reports purulent nasal discharge now, that is similar to that time Neti pot trials fail. 3 times now he has these severe sinus infection  Discovered septal perforation - he had no knowledge of. R ear feels pressure like under water       Objective:  Physical Exam  BP (!) 160/81 (BP Location: Left Arm, Patient Position: Sitting)   Pulse 75   Temp 98 F (36.7 C) (Temporal)   Ht '5\' 10"'$  (1.778 m)   Wt 189 lb 12.8 oz (86.1 kg)   SpO2 98%   BMI 27.23 kg/m   Overweight  by BMI criteria but truncal adiposity (waist circumference or caliper) should be used instead. Wt Readings from Last 10 Encounters:  09/15/22 189 lb 12.8 oz (86.1 kg)  08/28/22 188 lb (85.3 kg)  11/04/20 185 lb 7 oz (84.1 kg)  11/01/20 185 lb 7 oz (84.1 kg)  09/24/20 191 lb 3.2 oz (86.7 kg)  06/24/20 206 lb 3.2 oz (93.5 kg)  06/04/20 211 lb 3.2 oz (95.8 kg)  11/07/19 208 lb 12.8 oz (94.7 kg)  09/28/19 211 lb 9.6 oz (96 kg)  08/07/19 215 lb (97.5 kg)   Vital signs reviewed.  Nursing notes reviewed. Weight trend reviewed. General Appearance:  Well developed, well nourished male in no acute distress.   Normal work of breathing at rest Musculoskeletal: All extremities are intact.  Neurological:  Awake, alert,  No obvious focal neurological deficits or cognitive impairments Psychiatric:  Appropriate mood, pleasant demeanor Problem-specific findings:  septal perforation visible from both nostrils with purulent drainage around, bulging R ear tympanic membrane       Results  Reviewed: No results found for any visits on 09/15/22.  Recent Results (from the past 2160 hour(s))  POCT HgB A1C     Status: Abnormal   Collection Time: 08/28/22  3:05 PM  Result Value Ref Range   Hemoglobin A1C 6.0 (A) 4.0 - 5.6 %   HbA1c POC (<> result, manual entry)     HbA1c, POC (prediabetic range)     HbA1c, POC (controlled diabetic range)    Microalbumin / creatinine urine ratio     Status: None   Collection Time: 08/28/22  3:39 PM  Result Value Ref Range  Creatinine, Urine 88 20 - 320 mg/dL   Microalb, Ur 2.1 mg/dL    Comment: Reference Range Not established    Microalb Creat Ratio 24 <30 mcg/mg creat    Comment: . The ADA defines abnormalities in albumin excretion as follows: Marland Kitchen Albuminuria Category        Result (mcg/mg creatinine) . Normal to Mildly increased   <30 Moderately increased         30-299  Severely increased           > OR = 300 . The ADA recommends that at least two of three specimens collected within a 3-6 month period be abnormal before considering a patient to be within a diagnostic category.   Lipid panel     Status: Abnormal   Collection Time: 08/28/22  3:39 PM  Result Value Ref Range   Cholesterol 226 (H) <200 mg/dL   HDL 54 > OR = 40 mg/dL   Triglycerides 191 (H) <150 mg/dL   LDL Cholesterol (Calc) 139 (H) mg/dL (calc)    Comment: Reference range: <100 . Desirable range <100 mg/dL for primary prevention;   <70 mg/dL for patients with CHD or diabetic patients  with > or = 2 CHD risk factors. Marland Kitchen LDL-C is now calculated using the Martin-Hopkins  calculation, which is a validated novel method providing  better accuracy than the Friedewald equation in the  estimation of LDL-C.  Cresenciano Genre et al. Annamaria Helling. 6283;151(76): 2061-2068  (http://education.[employer].com/faq/FAQ164)    Total CHOL/HDL Ratio 4.2 <5.0 (calc)   Non-HDL Cholesterol (Calc) 172 (H) <130 mg/dL (calc)    Comment: For patients with diabetes plus 1 major ASCVD risk   factor, treating to a non-HDL-C goal of <100 mg/dL  (LDL-C of <70 mg/dL) is considered a therapeutic  option.   Comprehensive metabolic panel     Status: Abnormal   Collection Time: 08/28/22  3:39 PM  Result Value Ref Range   Glucose, Bld 101 (H) 65 - 99 mg/dL    Comment: .            Fasting reference interval . For someone without known diabetes, a glucose value between 100 and 125 mg/dL is consistent with prediabetes and should be confirmed with a follow-up test. .    BUN 14 7 - 25 mg/dL   Creat 0.87 0.70 - 1.30 mg/dL   BUN/Creatinine Ratio SEE NOTE: 6 - 22 (calc)    Comment:    Not Reported: BUN and Creatinine are within    reference range. .    Sodium 141 135 - 146 mmol/L   Potassium 4.5 3.5 - 5.3 mmol/L   Chloride 100 98 - 110 mmol/L   CO2 26 20 - 32 mmol/L   Calcium 9.9 8.6 - 10.3 mg/dL   Total Protein 7.6 6.1 - 8.1 g/dL   Albumin 4.7 3.6 - 5.1 g/dL   Globulin 2.9 1.9 - 3.7 g/dL (calc)   AG Ratio 1.6 1.0 - 2.5 (calc)   Total Bilirubin 0.9 0.2 - 1.2 mg/dL   Alkaline phosphatase (APISO) 59 35 - 144 U/L   AST 32 10 - 35 U/L   ALT 41 9 - 46 U/L  CBC with Differential/Platelet     Status: Abnormal   Collection Time: 08/28/22  3:39 PM  Result Value Ref Range   WBC 6.3 3.8 - 10.8 Thousand/uL   RBC 5.68 4.20 - 5.80 Million/uL   Hemoglobin 17.7 (H) 13.2 - 17.1 g/dL   HCT 52.0 (H) 38.5 -  50.0 %   MCV 91.5 80.0 - 100.0 fL   MCH 31.2 27.0 - 33.0 pg   MCHC 34.0 32.0 - 36.0 g/dL   RDW 13.5 11.0 - 15.0 %   Platelets 261 140 - 400 Thousand/uL   MPV 10.2 7.5 - 12.5 fL   Neutro Abs 3,963 1,500 - 7,800 cells/uL   Lymphs Abs 1,714 850 - 3,900 cells/uL   Absolute Monocytes 504 200 - 950 cells/uL   Eosinophils Absolute 69 15 - 500 cells/uL   Basophils Absolute 50 0 - 200 cells/uL   Neutrophils Relative % 62.9 %   Total Lymphocyte 27.2 %   Monocytes Relative 8.0 %   Eosinophils Relative 1.1 %   Basophils Relative 0.8 %          Signed: Loralee Pacas,  MD 09/15/2022 2:01 PM

## 2022-09-15 NOTE — Patient Instructions (Signed)
This shows  how the eustachian canal works to drain the inner ear and how it is connected to the nasopharynx.  Nasal sinus rinses with saline nasal mist sprays can rinse all of the pollen and allergens and other irritants and pathogens out of his sinuses and nasopharynx, reducing the plugging/swelling around the eustachian tube.  The sinus rinse clears away the mucus and allows nasal steroid sprays to help reach the eustachian tube and reduce swellling to open it up.    Basic Sinus Care: Mist each nostril nightly with sterile saline nasal mist, then spray each nostril immediately after with fluticasone nasal spray (Flonase) or other steroid or antihistamine nasal pray Next, if symptoms persist, add a daily allergy pill.  Benadryl is the strongest but will make you very drowsy so only take when you can sleep after.  Ear Pressure Relief Maneuvers: Step 1 If the congestion is mild, you can often use simple maneuvers to quickly alter the pressure in your middle ear, such as: Swallowing Yawning Chewing gum Sucking on hard candy Similar methods can be used on children. If traveling with an infant or toddler, try giving them a bottle, pacifier, or something to drink or suck on.  Warm compress: Applying a warm, moist cloth to the back of your ear can help reduce swelling and help drain congested passages. In some cases, these interventions will cause the ears will pop without trying. If they don't, give it 20 minutes and see if swallowing, yawning, chewing gum, or sucking on hard candy helps.  Step 2 If these methods alone don't help, you can try other interventions like:  Decongestants: OTC drugs like Afrin (oxymetazoline) or Sudafed (pseudoephedrine) work by reducing the swelling of blood vessels in the nasal passages and Eustachian tubes.  Never use these medications for more than a few days at a time, especially afrin is dependency-forming  If this isn't working or you need more than a few days  of afrin or sudafed... you should make an appointment.   Step 3 (just for mod severe ear pressure and pain) If these interventions don't help, there are three other advanced strategies you can try called the Valsalva maneuver, the Toynbee maneuver, and the Frenzel maneuver.  Advanced Strategy 1:  The Valsalva maneuver Inhale. Pinch your nose shut with your fingers. Keeping your lips tightly shut, blow out forcefully as if you are blowing up a balloon. To increase the pressure, try bearing down as if having a bowel movement.  Advanced Strategy 2:  The Toynbee maneuver The Toynbee maneuver may also be safer than the Valsalva maneuver if you've had a previous eardrum injury. The Valsalva method exerts much more pressure on the eardrum and can possibly cause a rupture if you blow too forcefully.  Keep your mouth tightly shut. Pinch your nose shut with your fingers. Swallow hard.  Advanced Strategy 3: The Frenzel maneuver Pinch your nose shut with your fingers Close your mouth and place the tip of your tongue behind your upper front teeth. Push the back of your tongue to the roof of your mouth as if making a hard "G" or "K" sound. The back of your tongue will touch the roof. While doing this, close your vocal folds at the back of your throat and lift your larynx (voice box) up to push the air out of your mouth and into your nose.  ------------------------------------------------------------------------ If all this fails despite extensive efforts then you need to go to an ear nose and throat for  surgical correction - but this should not be tried until everything else fails.

## 2022-09-15 NOTE — Assessment & Plan Note (Addendum)
Patients chart review and interview were used to generate a prompt for artificial intelligence analysis ([employer] artificial intelligence) clinical decision support.  AI output was reviewed and is provided in red:   #Septal Perforation  A 57 year old male with a history of recurrent acute bacterial sinusitis presents with an incidental finding of septal perforation, which he was previously unaware of. The patient denies any history of drug insufflation, which is a common cause of septal perforation. The recurrent sinus infections could suggest an underlying issue with nasal structure or immune defense. The septal perforation may be contributing to the sinusitis by altering normal nasal airflow and mucociliary clearance. However, the patient reports that nasal breathing is usually satisfactory, though it becomes obstructed during episodes of sinusitis. The differential diagnosis includes septal perforation secondary to chronic inflammation from recurrent sinusitis, traumatic injury, Wegener's granulomatosis, or other autoimmune conditions.  Dx: - Nasal endoscopy to assess the extent of the septal perforation and to look for other nasal abnormalities. - CT scan of the sinuses to evaluate the sinus anatomy and to rule out any sinus disease that could be contributing to the septal perforation. - Blood tests including complete blood count (CBC), erythrocyte sedimentation rate (ESR), and antineutrophil cytoplasmic antibodies (ANCA) to assess for systemic inflammation or autoimmune conditions. - Referral to an otolaryngologist for further evaluation and management.  Tx: - Saline nasal irrigations to maintain moisture in the nasal passages and to help clear any mucus or debris. - Nasal emollients to prevent drying and crusting within the nasal cavity. - Avoidance of nasal irritants, such as smoke and strong chemicals. - Consideration of septal perforation repair if the patient is symptomatic or if the  perforation is large, but this would be based on specialist advice. - Management of recurrent sinusitis, which may include antibiotics for acute episodes, nasal corticosteroids, and possibly sinus surgery if indicated by the otolaryngologist after a thorough evaluation.

## 2022-09-18 NOTE — Progress Notes (Signed)
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 Reaching out to see if you think we need to have him return for repeat draw for anca.  Maybe should wait until he follows up with you soon.

## 2022-09-19 LAB — RPR: RPR Ser Ql: NONREACTIVE

## 2022-09-19 LAB — ANTI-NUCLEAR AB-TITER (ANA TITER): ANA Titer 1: 1:40 {titer} — ABNORMAL HIGH

## 2022-09-19 LAB — HIV ANTIBODY (ROUTINE TESTING W REFLEX): HIV 1&2 Ab, 4th Generation: NONREACTIVE

## 2022-09-19 LAB — ANA: Anti Nuclear Antibody (ANA): POSITIVE — AB

## 2022-09-19 LAB — ANCA SCREEN W REFLEX TITER: ANCA SCREEN: NEGATIVE

## 2022-09-21 ENCOUNTER — Encounter: Payer: Self-pay | Admitting: Family Medicine

## 2022-09-21 ENCOUNTER — Ambulatory Visit: Payer: 59 | Admitting: Family Medicine

## 2022-09-21 VITALS — BP 158/108 | HR 99 | Temp 98.0°F | Ht 70.0 in | Wt 191.4 lb

## 2022-09-21 DIAGNOSIS — H9191 Unspecified hearing loss, right ear: Secondary | ICD-10-CM | POA: Diagnosis not present

## 2022-09-21 DIAGNOSIS — I10 Essential (primary) hypertension: Secondary | ICD-10-CM | POA: Diagnosis not present

## 2022-09-21 DIAGNOSIS — H6991 Unspecified Eustachian tube disorder, right ear: Secondary | ICD-10-CM | POA: Diagnosis not present

## 2022-09-21 NOTE — Progress Notes (Signed)
   Established Patient Office Visit   Subjective  Patient ID: [name] White., male    DOB: 15-Nov-1965  Age: 57 y.o. MRN: [id number]  Chief Complaint  Patient presents with   Hearing Problem    Pt reports he got back from traveling to [place] on last Monday. His sx started on that day. He reports he had sinus infection and was seen with Dr. Randol Kern on 09/15/2022 and completed his antibiotic prescription.     Pt is a 56 yo male with pmh sig for HTN, Afib, HLD, arthritis, scoliosis, DM2 who is followed by Dr. Orvil Feil and seen today for ongoing concern.  Patient seen 09/15/2021 by Dr. Randol Kern, ENT for sinusitis after recent return from [place].  Patient started on ABX Levaquin, Flonase, Sudafed, Claritin.  Patient endorses decreased hearing in right ear.  Denies pain in ears.  Has tried steam from shower, trying to get ear to pop without relief in symptoms.      ROS Negative unless stated above    Objective:     BP (!) 158/108 (BP Location: Right Arm, Patient Position: Sitting, Cuff Size: Large)   Pulse 99   Temp 98 F (36.7 C) (Oral)   Ht '5\' 10"'$  (1.778 m)   Wt 191 lb 6.4 oz (86.8 kg)   SpO2 97%   BMI 27.46 kg/m    Physical Exam Constitutional:      Appearance: Normal appearance.  HENT:     Head: Normocephalic and atraumatic.     Right Ear: Decreased hearing noted.     Left Ear: Hearing, tympanic membrane, ear canal and external ear normal.     Ears:     Comments: Right canal with abrasion right. TM full without suppurative fluid, air-fluid level, or erythema.    Nose: Nose normal.     Mouth/Throat:     Mouth: Mucous membranes are moist.  Eyes:     Extraocular Movements: Extraocular movements intact.     Pupils: Pupils are equal, round, and reactive to light.  Cardiovascular:     Rate and Rhythm: Normal rate and regular rhythm.  Pulmonary:     Effort: Pulmonary effort is normal.     Breath sounds: Normal breath sounds. No wheezing or rales.  Musculoskeletal:      Cervical back: Normal range of motion.  Lymphadenopathy:     Cervical: No cervical adenopathy.  Skin:    General: Skin is warm and dry.  Neurological:     Mental Status: He is alert and oriented to person, place, and time.      No results found for any visits on 09/21/22.    Assessment & Plan:  Eustachian tube dysfunction, right -Likely 2/2 recent sinusitis -Continue nasal saline rinse, Flonase. -As Claritin does not seem to be helping advised patient to switch allergy medications.  Given samples of Zyrtec. -Will discontinue Sudafed given elevated BP. -For continued or worsening symptoms follow-up with ENT  Decreased hearing of right ear  Essential hypertension -Uncontrolled -Likely 2/2 use of Sudafed -Patient advised to stop decongestant medications -Continue lisinopril 20 mg daily. -Monitor blood pressure daily.  For continued elevation schedule follow-up appointment for med adjustment.   Return if symptoms worsen or fail to improve.   [name], MD

## 2022-09-21 NOTE — Progress Notes (Signed)
Negative anca argues against wegeners. I have a large artificial intelligence analysis of other possibilities in my recent note, for the septal perforation but have not referred to ENT.  He should follow up with you soon to decide next steps

## 2022-09-25 ENCOUNTER — Ambulatory Visit: Payer: 59 | Admitting: Family Medicine

## 2022-10-07 ENCOUNTER — Telehealth: Payer: Self-pay

## 2022-10-07 NOTE — Patient Outreach (Signed)
  Care Coordination   Initial Visit Note   10/07/2022 Name: [name] White. MRN: [id number] DOB: 1966/08/02  [name] White. is a 57 y.o. year old male who sees Yong Channel, Brayton Mars, MD for primary care. I spoke with  Tressie Ellis. by phone today.  What matters to the patients health and wellness today?  none    Goals Addressed             This Visit's Progress    COMPLETED: Care Coordination Activities-No follow up required       Interventions Today    Flowsheet Row Most Recent Value  Chronic Disease   Chronic disease during today's visit Diabetes  General Interventions   General Interventions Discussed/Reviewed General Interventions Discussed, Doctor Visits, Health Screening  Doctor Visits Discussed/Reviewed Doctor Visits Discussed, Annual Wellness Visits  Health Screening Colonoscopy  Education Interventions   Education Provided Provided Education  Provided Verbal Education On Blood Sugar Monitoring              SDOH assessments and interventions completed:  Yes  SDOH Interventions Today    Flowsheet Row Most Recent Value  SDOH Interventions   Food Insecurity Interventions Intervention Not Indicated  Housing Interventions Intervention Not Indicated        Care Coordination Interventions:  Yes, provided   Follow up plan: No further intervention required.   Encounter Outcome:  Pt. Visit Completed   Jone Baseman, RN, MSN [place] [facility] Care Management Coordinator Direct Line [phone]

## 2022-10-07 NOTE — Patient Instructions (Addendum)
Visit Information  Thank you for taking time to visit with me today. Please don't hesitate to contact me if I can be of assistance to you.   Following are the goals we discussed today:   Goals Addressed             This Visit's Progress    COMPLETED: Care Coordination Activities-No follow up required       Interventions Today    Flowsheet Row Most Recent Value  Chronic Disease   Chronic disease during today's visit Diabetes  General Interventions   General Interventions Discussed/Reviewed General Interventions Discussed, Doctor Visits, Health Screening  Doctor Visits Discussed/Reviewed Doctor Visits Discussed, Annual Wellness Visits  Health Screening Colonoscopy  Education Interventions   Education Provided Provided Education  Provided Verbal Education On Blood Sugar Monitoring      Update: Telephone call to patient to advise on [facility] [phone] to schedule colonoscopy.  He verbalized understanding.           If you are experiencing a Mental Health or [place] or need someone to talk to, please call the Suicide and Crisis Lifeline: 988   Patient verbalizes understanding of instructions and care plan provided today and agrees to view in [place]. Active MyChart status and patient understanding of how to access instructions and care plan via MyChart confirmed with patient.     No further follow up required: decline  Jone Baseman, RN, MSN [place] [facility] Care Management Coordinator Direct Line [phone]

## 2022-10-08 ENCOUNTER — Encounter: Payer: Self-pay | Admitting: Family Medicine

## 2022-10-13 ENCOUNTER — Other Ambulatory Visit: Payer: 59

## 2022-11-24 ENCOUNTER — Other Ambulatory Visit: Payer: Self-pay

## 2022-11-24 MED ORDER — LISINOPRIL 20 MG PO TABS: ORAL_TABLET | ORAL | 3 refills | Status: DC

## 2023-06-22 ENCOUNTER — Ambulatory Visit (INDEPENDENT_AMBULATORY_CARE_PROVIDER_SITE_OTHER): Payer: Medicare Other

## 2023-06-22 VITALS — Wt 191.0 lb

## 2023-06-22 DIAGNOSIS — Z0000 Encounter for general adult medical examination without abnormal findings: Secondary | ICD-10-CM | POA: Diagnosis not present

## 2023-06-22 NOTE — Patient Instructions (Signed)
[name] White , Thank you for taking time to come for your Medicare Wellness Visit. I appreciate your ongoing commitment to your health goals. Please review the following plan we discussed and let me know if I can assist you in the future.   Referrals/Orders/Follow-Ups/Clinician Recommendations: Aim for 30 minutes of exercise or brisk walking, 6-8 glasses of water, and 5 servings of fruits and vegetables each day.   This is a list of the screening recommended for you and due dates:  Health Maintenance  Topic Date Due   Eye exam for diabetics  Never done   Zoster (Shingles) Vaccine (1 of 2) Never done   Complete foot exam   06/24/2021   Colon Cancer Screening  09/08/2021   Hemoglobin A1C  02/26/2023   Flu Shot  03/18/2023   Yearly kidney health urinalysis for diabetes  08/29/2023   Yearly kidney function blood test for diabetes  09/16/2023   Medicare Annual Wellness Visit  06/21/2024   DTaP/Tdap/Td vaccine (3 - Td or Tdap) 06/04/2030   Hepatitis C Screening  Completed   HIV Screening  Completed   Pneumococcal Vaccination  Aged Out   HPV Vaccine  Aged Out   COVID-19 Vaccine  Discontinued    Advanced directives: (Declined) Advance directive discussed with you today. Even though you declined this today, please call our office should you change your mind, and we can give you the proper paperwork for you to fill out.  Next Medicare Annual Wellness Visit scheduled for next year: Yes

## 2023-06-22 NOTE — Progress Notes (Signed)
Subjective:   [name] White. is a 57 y.o. male who presents for Medicare Annual/Subsequent preventive examination.  Visit Complete: Virtual I connected with  [name] White. on 06/22/23 by a audio enabled telemedicine application and verified that I am speaking with the correct person using two identifiers.  Patient Location: Home  Provider Location: Office/Clinic  I discussed the limitations of evaluation and management by telemedicine. The patient expressed understanding and agreed to proceed.  Vital Signs: Because this visit was a virtual/telehealth visit, some criteria may be missing or patient reported. Any vitals not documented were not able to be obtained and vitals that have been documented are patient reported.   Cardiac Risk Factors include: advanced age (>31men, >37 women);hypertension;male gender;dyslipidemia;diabetes mellitus     Objective:    Today's Vitals   06/22/23 1417  Weight: 191 lb (86.6 kg)   Body mass index is 27.41 kg/m.     06/22/2023    2:20 PM 06/06/2021   11:43 AM 11/01/2020    2:32 PM 05/31/2020   11:51 AM 05/01/2019    1:19 PM 09/20/2018    6:02 AM 09/12/2018   11:35 AM  Advanced Directives  Does Patient Have a Medical Advance Directive? No No No No No No No  Would patient like information on creating a medical advance directive? No - Patient declined No - Patient declined No - Patient declined No - Patient declined No - Patient declined No - Patient declined No - Patient declined    Current Medications (verified) Outpatient Encounter Medications as of 06/22/2023  Medication Sig   acetaminophen (TYLENOL) 500 MG tablet Take 1,000 mg by mouth every 6 (six) hours as needed for mild pain or moderate pain.   diphenhydrAMINE (BENADRYL) 25 mg capsule Take 25 mg by mouth every 6 (six) hours as needed for allergies.   Esomeprazole Magnesium 20 MG TBEC Take 20 mg by mouth daily before breakfast.   fluticasone (FLONASE) 50 MCG/ACT nasal spray Place 2  sprays into both nostrils daily.   lisinopril (ZESTRIL) 20 MG tablet TAKE 1 TABLET(20 MG) BY MOUTH DAILY   loperamide (IMODIUM) 2 MG capsule Take 2 mg by mouth daily as needed for diarrhea or loose stools.   loratadine (CLARITIN) 10 MG tablet Take 1 tablet (10 mg total) by mouth daily.   Melatonin 10 MG TBDP Take 10 mg by mouth daily as needed (sleep).   methocarbamol (ROBAXIN) 500 MG tablet Take 500 mg by mouth every 8 (eight) hours as needed.   Omega-3 1000 MG CAPS Take 1 capsule (1,000 mg total) by mouth daily. *Restart on 11/09/2020*   oxyCODONE (OXY IR/ROXICODONE) 5 MG immediate release tablet Take 5 mg by mouth every 6 (six) hours as needed.   pseudoephedrine (SUDAFED 12 HOUR) 120 MG 12 hr tablet Take 1 tablet (120 mg total) by mouth 2 (two) times daily.   rosuvastatin (CRESTOR) 5 MG tablet Take 1 tablet (5 mg total) by mouth once a week.   Saline (SIMPLY SALINE) 0.9 % AERS Place 2 each into the nose as directed. Use nightly for sinus hygiene long-term.  Can also be used as many times daily as desired to assist with clearing congested sinuses.   vitamin C (ASCORBIC ACID) 500 MG tablet Take 500 mg by mouth daily.   No facility-administered encounter medications on file as of 06/22/2023.    Allergies (verified) Patient has no known allergies.   History: Past Medical History:  Diagnosis Date   AF (atrial fibrillation) (  HCC) 2009   nonsustained   Arthritis    Blood transfusion without reported diagnosis    1960's/ as baby   Cardiomyopathy secondary 2009   had ablation   Chronic pain syndrome    back pain   Dysrhythmia    states no longer a problem   Heart murmur    states no longer there   History of kidney stones    Hyperlipidemia    Hypertension    Inguinal hernia    left   Nephrolithiasis    2 kidney stones   Pre-diabetes    PSVT (paroxysmal supraventricular tachycardia) (HCC)    had ablation   Scoliosis    severe/ has a lot metal in back per pt/    Past Surgical  History:  Procedure Laterality Date   ABDOMINAL EXPOSURE N/A 09/20/2018   Procedure: ABDOMINAL EXPOSURE;  Surgeon: Chuck Hint, MD;  Location: [facility] OR;  Service: Vascular;  Laterality: N/A;   ANTERIOR LUMBAR FUSION N/A 09/20/2018   Procedure: Lumbar 5 Sacral 1 Anterior lumbar interbody fusion with Lumbar 3 to Iliac fusion with pedicle screws/Lumbar 2-3 Decompressive laminectomy;  Surgeon: Julio Sicks, MD;  Location: [facility] OR;  Service: Neurosurgery;  Laterality: N/A;  Lumbar 5 Sacral 1 Anterior lumbar interbody fusion with Lumbar 3 to Iliac fusion with pedicle screws/Lumbar 2-3 Decompressive laminectomy   APPLICATION OF ROBOTIC ASSISTANCE FOR SPINAL PROCEDURE N/A 09/20/2018   Procedure: APPLICATION OF ROBOTIC ASSISTANCE FOR SPINAL PROCEDURE;  Surgeon: Julio Sicks, MD;  Location: [facility] OR;  Service: Neurosurgery;  Laterality: N/A;   AV NODE ABLATION     09/16/07. SVT Dr. Polo Riley SURGERY     01/12/00 and 1982/ has had 6 surgeries on back   COLONOSCOPY     INGUINAL HERNIA REPAIR     Left   LUMBAR WOUND DEBRIDEMENT N/A 09/26/2018   Procedure: LUMBAR WOUND DEBRIDEMENT Placement of lumbar drain;  Surgeon: Julio Sicks, MD;  Location: [facility] OR;  Service: Neurosurgery;  Laterality: N/A;  LUMBAR WOUND DEBRIDEMENT Placement of lumbar drain   POSTERIOR CERVICAL LAMINECTOMY Left 11/04/2020   Procedure: Laminectomy and Foraminotomy - left - Cervical four-five;  Surgeon: Julio Sicks, MD;  Location: [facility] OR;  Service: Neurosurgery;  Laterality: Left;   POSTERIOR LUMBAR FUSION 4 LEVEL N/A 09/20/2018   Procedure: POSTERIOR LUMBAR FUSION 4 LEVEL;  Surgeon: Julio Sicks, MD;  Location: [facility] OR;  Service: Neurosurgery;  Laterality: N/A;   REPLACEMENT TOTAL KNEE     1999, L. Murphy/wainer.    TOTAL HIP ARTHROPLASTY Right 12/07/2016   Procedure: TOTAL HIP ARTHROPLASTY ANTERIOR APPROACH;  Surgeon: Gean Birchwood, MD;  Location: [facility] OR;  Service: Orthopedics;  Laterality: Right;   Family History  Problem Relation Age of Onset    Breast cancer Mother    Diabetes Father    Heart attack Father        late 76s   Atrial fibrillation Father    Liver disease Paternal Uncle    Social History   Socioeconomic History   Marital status: Divorced    Spouse name: Not on file   Number of children: Not on file   Years of education: Not on file   Highest education level: Not on file  Occupational History   Occupation: disabled   Tobacco Use   Smoking status: Former    Current packs/day: 0.00    Average packs/day: 0.3 packs/day for 8.0 years (2.4 ttl pk-yrs)    Types: Cigarettes    Start date: 08/18/2003  Quit date: 08/18/2011    Years since quitting: 11.8   Smokeless tobacco: Never  Vaping Use   Vaping status: Every Day   Start date: 04/17/2020   Devices: Elfbar  Substance and Sexual Activity   Alcohol use: Yes    Alcohol/week: 12.0 standard drinks of alcohol    Types: 12 Cans of beer per week    Comment: weekly   Drug use: No    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 2 daughters. No grandkids.       Disabled due to back. 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       Primary caregiver for father who lives with him    Social Determinants of Health   Financial Resource Strain: Low Risk  (06/22/2023)   Overall Financial Resource Strain (CARDIA)    Difficulty of Paying Living Expenses: Not hard at all  Food Insecurity: No Food Insecurity (06/22/2023)   Hunger Vital Sign    Worried About Running Out of Food in the Last Year: Never true    Ran Out of Food in the Last Year: Never true  Transportation Needs: No Transportation Needs (06/22/2023)   PRAPARE - Administrator, Civil Service (Medical): No    Lack of Transportation (Non-Medical): No  Physical Activity: Inactive (06/22/2023)   Exercise Vital Sign    Days of Exercise per Week: 0 days    Minutes of Exercise per Session: 0 min  Stress: No  Stress Concern Present (06/22/2023)   [employer] of Occupational Health - Occupational Stress Questionnaire    Feeling of Stress : Not at all  Social Connections: Socially Isolated (06/22/2023)   Social Connection and Isolation Panel [NHANES]    Frequency of Communication with Friends and Family: More than three times a week    Frequency of Social Gatherings with Friends and Family: More than three times a week    Attends Religious Services: Never    Database administrator or Organizations: No    Attends Engineer, structural: Never    Marital Status: Divorced    Tobacco Counseling Counseling given: Not Answered   Clinical Intake:  Pre-visit preparation completed: Yes  Pain : No/denies pain     BMI - recorded: 27.41 Nutritional Status: BMI 25 -29 Overweight Nutritional Risks: None Diabetes: Yes CBG done?: No Did pt. bring in CBG monitor from home?: No  How often do you need to have someone help you when you read instructions, pamphlets, or other written materials from your doctor or pharmacy?: 1 - Never  Interpreter Needed?: No  Information entered by :: Lanier Ensign, LPN   Activities of Daily Living    06/22/2023    2:18 PM  In your present state of health, do you have any difficulty performing the following activities:  Hearing? 0  Vision? 0  Difficulty concentrating or making decisions? 0  Walking or climbing stairs? 0  Dressing or bathing? 0  Doing errands, shopping? 0  Preparing Food and eating ? N  Using the Toilet? N  In the past six months, have you accidently leaked urine? N  Do you have problems with loss of bowel control? N  Managing your Medications? N  Managing your Finances? N  Housekeeping or managing your Housekeeping? N    Patient Care Team: Shelva Majestic, MD as PCP - General (Family  Medicine) Julio Sicks, MD as Consulting Physician (Neurosurgery) Chuck Hint, MD (Inactive) as Consulting Physician (Vascular  Surgery)  Indicate any recent Medical Services you may have received from other than [facility] providers in the past year (date may be approximate).     Assessment:   This is a routine wellness examination for [name] White.  Hearing/Vision screen Hearing Screening - Comments:: Pt denies any hearing issues  Vision Screening - Comments:: Pt encouraged to follow up with provider    Goals Addressed             This Visit's Progress    Patient Stated       Maintain health and weight        Depression Screen    06/22/2023    2:21 PM 09/21/2022    3:10 PM 08/28/2022    2:53 PM 06/06/2021   11:42 AM 09/24/2020    4:18 PM 05/31/2020   11:47 AM 11/07/2019    2:44 PM  PHQ 2/9 Scores  PHQ - 2 Score 0 0 0 0 0 0 0  PHQ- 9 Score  0     1    Fall Risk    06/22/2023    2:22 PM 09/21/2022    3:10 PM 06/06/2021   11:44 AM 09/24/2020    4:18 PM 05/31/2020   11:53 AM  Fall Risk   Falls in the past year? 0 1 0 1 0  Number falls in past yr: 0 1 0 0 0  Injury with Fall? 0 0 0 1 0  Risk for fall due to : No Fall Risks No Fall Risks     Follow up Falls prevention discussed Falls evaluation completed Falls prevention discussed  Falls prevention discussed    MEDICARE RISK AT HOME: Medicare Risk at Home Any stairs in or around the home?: Yes If so, are there any without handrails?: No Home free of loose throw rugs in walkways, pet beds, electrical cords, etc?: Yes Adequate lighting in your home to reduce risk of falls?: Yes Life alert?: No Use of a cane, walker or w/c?: No Grab bars in the bathroom?: No Shower chair or bench in shower?: No Elevated toilet seat or a handicapped toilet?: No  TIMED UP AND GO:  Was the test performed?  No    Cognitive Function:        06/22/2023    2:23 PM 06/06/2021   11:47 AM 05/31/2020   11:56 AM  6CIT Screen  What Year? 0 points 0 points 0 points  What month? 0 points 0 points 0 points  What time? 0 points 0 points   Count back from 20 0 points 0 points 0  points  Months in reverse 0 points 2 points 0 points  Repeat phrase 0 points 0 points 0 points  Total Score 0 points 2 points     Immunizations Immunization History  Administered Date(s) Administered   Influenza Split 06/19/2011   Influenza Whole 05/20/2009, 06/16/2010   Influenza,inj,Quad PF,6+ Mos 08/01/2013, 07/03/2014, 06/21/2015, 06/16/2018   Influenza-Unspecified 06/22/2016   Pneumococcal Conjugate-13 07/03/2014   Pneumococcal Polysaccharide-23 06/24/2020   Td 01/06/2010   Tdap 06/04/2020    TDAP status: Up to date  Flu Vaccine status: Due, Education has been provided regarding the importance of this vaccine. Advised may receive this vaccine at local pharmacy or Health Dept. Aware to provide a copy of the vaccination record if obtained from local pharmacy or Health Dept. Verbalized acceptance and understanding.  Covid-19 vaccine status: Declined, Education has been provided regarding the importance of this vaccine but patient still declined. Advised may receive this vaccine at local pharmacy or Health Dept.or vaccine clinic. Aware to provide a copy of the vaccination record if obtained from local pharmacy or Health Dept. Verbalized acceptance and understanding.  Qualifies for Shingles Vaccine? Yes   Zostavax completed No   Shingrix Completed?: No.    Education has been provided regarding the importance of this vaccine. Patient has been advised to call insurance company to determine out of pocket expense if they have not yet received this vaccine. Advised may also receive vaccine at local pharmacy or Health Dept. Verbalized acceptance and understanding.  Screening Tests Health Maintenance  Topic Date Due   OPHTHALMOLOGY EXAM  Never done   Zoster Vaccines- Shingrix (1 of 2) Never done   FOOT EXAM  06/24/2021   Colonoscopy  09/08/2021   HEMOGLOBIN A1C  02/26/2023   INFLUENZA VACCINE  11/15/2023 (Originally 03/18/2023)   Diabetic kidney evaluation - Urine ACR  08/29/2023    Diabetic kidney evaluation - eGFR measurement  09/16/2023   Medicare Annual Wellness (AWV)  06/21/2024   DTaP/Tdap/Td (3 - Td or Tdap) 06/04/2030   Hepatitis C Screening  Completed   HIV Screening  Completed   Pneumococcal Vaccine 5-58 Years old  Aged Out   HPV VACCINES  Aged Out   COVID-19 Vaccine  Discontinued    Health Maintenance  Health Maintenance Due  Topic Date Due   OPHTHALMOLOGY EXAM  Never done   Zoster Vaccines- Shingrix (1 of 2) Never done   FOOT EXAM  06/24/2021   Colonoscopy  09/08/2021   HEMOGLOBIN A1C  02/26/2023    Declined colonoscopy at this time    Additional Screening:  Hepatitis C Screening: Completed 07/13/16  Vision Screening: Recommended annual ophthalmology exams for early detection of glaucoma and other disorders of the eye. Is the patient up to date with their annual eye exam?  No  Who is the provider or what is the name of the office in which the patient attends annual eye exams? Encouraged to follow up with provide  If pt is not established with a provider, would they like to be referred to a provider to establish care? No .   Dental Screening: Recommended annual dental exams for proper oral hygiene  Diabetic Foot Exam: Diabetic Foot Exam: Overdue, Pt has been advised about the importance in completing this exam. Pt is scheduled for diabetic foot exam on next appt .  Community Resource Referral / Chronic Care Management: CRR required this visit?  No   CCM required this visit?  No     Plan:     I have personally reviewed and noted the following in the patient's chart:   Medical and social history Use of alcohol, tobacco or illicit drugs  Current medications and supplements including opioid prescriptions. Patient is currently taking opioid prescriptions. Information provided to patient regarding non-opioid alternatives. Patient advised to discuss non-opioid treatment plan with their provider. Functional ability and status Nutritional  status Physical activity Advanced directives List of other physicians Hospitalizations, surgeries, and ER visits in previous 12 months Vitals Screenings to include cognitive, depression, and falls Referrals and appointments  In addition, I have reviewed and discussed with patient certain preventive protocols, quality metrics, and best practice recommendations. A written personalized care plan for preventive services as well as general preventive health recommendations were provided to patient.     Marzella Schlein, LPN  06/22/2023   After Visit Summary: (MyChart) Due to this being a telephonic visit, the after visit summary with patients personalized plan was offered to patient via MyChart   Nurse Notes: none

## 2023-09-14 ENCOUNTER — Other Ambulatory Visit: Payer: Self-pay | Admitting: Family Medicine

## 2023-10-07 ENCOUNTER — Other Ambulatory Visit: Payer: Self-pay | Admitting: Family Medicine

## 2024-06-27 ENCOUNTER — Ambulatory Visit: Payer: Medicare Other

## 2024-06-28 ENCOUNTER — Other Ambulatory Visit: Payer: Self-pay | Admitting: Family Medicine

## 2024-09-08 ENCOUNTER — Other Ambulatory Visit: Payer: Self-pay | Admitting: Family Medicine
